# Patient Record
Sex: Male | Born: 1975 | Marital: Single | Smoking: Never smoker
Health system: Northeastern US, Academic
[De-identification: ages and names within clinical notes are randomized; demographics above are authoritative.]

## PROBLEM LIST (undated history)

## (undated) DIAGNOSIS — K91850 Pouchitis: Secondary | ICD-10-CM

## (undated) DIAGNOSIS — R002 Palpitations: Secondary | ICD-10-CM

## (undated) DIAGNOSIS — M810 Age-related osteoporosis without current pathological fracture: Secondary | ICD-10-CM

## (undated) DIAGNOSIS — K5190 Ulcerative colitis, unspecified, without complications: Secondary | ICD-10-CM

## (undated) DIAGNOSIS — K8301 Primary sclerosing cholangitis: Secondary | ICD-10-CM

## (undated) DIAGNOSIS — D649 Anemia, unspecified: Secondary | ICD-10-CM

## (undated) DIAGNOSIS — K769 Liver disease, unspecified: Secondary | ICD-10-CM

## (undated) DIAGNOSIS — K8309 Other cholangitis: Secondary | ICD-10-CM

## (undated) HISTORY — PX: APPENDECTOMY: SHX54

## (undated) HISTORY — DX: Ulcerative colitis, unspecified, without complications: K51.90

## (undated) HISTORY — PX: COLONOSCOPY: SHX174

## (undated) HISTORY — DX: Liver disease, unspecified: K76.9

## (undated) HISTORY — PX: TOTAL COLECTOMY: SHX852

## (undated) HISTORY — DX: Pouchitis: K91.850

## (undated) HISTORY — DX: Primary sclerosing cholangitis: K83.01

## (undated) HISTORY — PX: LAPAROSCOPIC TOTAL ABDOMINAL COLECTOMY: SHX5932

## (undated) HISTORY — PX: COLON SURGERY: SHX602

---

## 2009-05-19 ENCOUNTER — Inpatient Hospital Stay: Admit: 2009-05-19 | Discharge: 2009-05-19 | Disposition: A | Discharge status: Home / Self Care | Payer: Self-pay

## 2009-05-19 ENCOUNTER — Other Ambulatory Visit: Payer: Self-pay | Admitting: Gastroenterology

## 2016-05-12 ENCOUNTER — Other Ambulatory Visit: Payer: Self-pay | Admitting: Gastroenterology

## 2016-05-12 DIAGNOSIS — R1084 Generalized abdominal pain: Secondary | ICD-10-CM

## 2016-05-12 DIAGNOSIS — K8301 Primary sclerosing cholangitis: Secondary | ICD-10-CM

## 2016-05-12 DIAGNOSIS — R197 Diarrhea, unspecified: Secondary | ICD-10-CM

## 2016-05-12 DIAGNOSIS — Z8719 Personal history of other diseases of the digestive system: Secondary | ICD-10-CM

## 2016-05-12 DIAGNOSIS — K625 Hemorrhage of anus and rectum: Secondary | ICD-10-CM

## 2016-05-15 ENCOUNTER — Inpatient Hospital Stay: Admit: 2016-05-15 | Discharge: 2016-05-15 | Disposition: A | Discharge status: Home / Self Care | Payer: Self-pay

## 2016-05-15 ENCOUNTER — Other Ambulatory Visit: Payer: Self-pay | Admitting: Gastroenterology

## 2016-05-15 ENCOUNTER — Ambulatory Visit
Admission: RE | Admit: 2016-05-15 | Discharge: 2016-05-15 | Disposition: A | Discharge status: Home / Self Care | Payer: BLUE CROSS/BLUE SHIELD | Source: Ambulatory Visit | Attending: Gastroenterology | Admitting: Gastroenterology

## 2016-05-15 DIAGNOSIS — R1084 Generalized abdominal pain: Secondary | ICD-10-CM | POA: Insufficient documentation

## 2016-05-15 DIAGNOSIS — R197 Diarrhea, unspecified: Secondary | ICD-10-CM | POA: Insufficient documentation

## 2016-05-15 DIAGNOSIS — K625 Hemorrhage of anus and rectum: Secondary | ICD-10-CM | POA: Diagnosis not present

## 2016-05-15 DIAGNOSIS — Z8719 Personal history of other diseases of the digestive system: Secondary | ICD-10-CM | POA: Insufficient documentation

## 2016-05-15 DIAGNOSIS — K830 Cholangitis: Secondary | ICD-10-CM | POA: Insufficient documentation

## 2016-05-15 DIAGNOSIS — K8301 Primary sclerosing cholangitis: Secondary | ICD-10-CM

## 2016-05-15 MED ORDER — IOPAMIDOL (ISOVUE-370) INJECTION 76%
125.0000 mL | Freq: Once | INTRAVENOUS | Status: AC | PRN
  Administered 2016-05-15: 125 mL via INTRAVENOUS

## 2016-05-18 ENCOUNTER — Other Ambulatory Visit: Payer: Self-pay | Admitting: Gastroenterology

## 2016-05-18 DIAGNOSIS — K7689 Other specified diseases of liver: Secondary | ICD-10-CM

## 2016-05-18 DIAGNOSIS — R935 Abnormal findings on diagnostic imaging of other abdominal regions, including retroperitoneum: Secondary | ICD-10-CM

## 2016-05-20 ENCOUNTER — Encounter: Payer: Self-pay | Admitting: Anesthesiology

## 2016-05-20 ENCOUNTER — Encounter
Admission: RE | Disposition: A | Discharge status: Home Health | Payer: Self-pay | Source: Ambulatory Visit | Attending: Gastroenterology

## 2016-05-20 ENCOUNTER — Encounter: Payer: Self-pay | Admitting: *Deleted

## 2016-05-20 ENCOUNTER — Ambulatory Visit
Admission: RE | Admit: 2016-05-20 | Discharge: 2016-05-20 | Disposition: A | Discharge status: Home Health | Payer: BLUE CROSS/BLUE SHIELD | Source: Ambulatory Visit | Attending: Gastroenterology | Admitting: Gastroenterology

## 2016-05-20 DIAGNOSIS — Z980 Intestinal bypass and anastomosis status: Secondary | ICD-10-CM | POA: Insufficient documentation

## 2016-05-20 DIAGNOSIS — R1084 Generalized abdominal pain: Secondary | ICD-10-CM | POA: Diagnosis present

## 2016-05-20 DIAGNOSIS — K91850 Pouchitis: Secondary | ICD-10-CM | POA: Insufficient documentation

## 2016-05-20 DIAGNOSIS — K529 Noninfective gastroenteritis and colitis, unspecified: Secondary | ICD-10-CM | POA: Diagnosis not present

## 2016-05-20 DIAGNOSIS — Z882 Allergy status to sulfonamides status: Secondary | ICD-10-CM | POA: Insufficient documentation

## 2016-05-20 HISTORY — DX: Anemia, unspecified: D64.9

## 2016-05-20 HISTORY — DX: Other cholangitis: K83.09

## 2016-05-20 HISTORY — PX: FLEXIBLE SIGMOIDOSCOPY: SHX5431

## 2016-05-20 HISTORY — DX: Ulcerative colitis, unspecified, without complications: K51.90

## 2016-05-20 SURGERY — SIGMOIDOSCOPY, FLEXIBLE
Anesthesia: General

## 2016-05-20 MED ORDER — SODIUM CHLORIDE 0.9 % IV SOLN: INTRAVENOUS | Status: DC

## 2016-05-20 NOTE — OR Nursing (Signed)
Patient had no sedation or IV placed therefore, was discharged after putting clothes on and talking with Dr. Marva PandaSkulskie

## 2016-05-20 NOTE — H&P (Signed)
Outpatient short stay form Pre-procedure 05/20/2016 3:04 PM Cristian Lee Sung Parodi MD  Primary Physician: No primary physician  Reason for visit:  Flexible sigmoidoscopy  History of present illness:  Patient is a 40 year old male presenting for flexible sigmoidoscopy as above. 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 The last endoscopic evaluation was in 2011 right after the procedure. He has had only a few follow-up evaluations at any point since that time. He presented to the GI clinic with complaint of abdominal pain and loose stools and some rectal bleeding. He was placed on a course of which is not yet completed that is feeling much better. Frequency of stools is decreased. Patient denies use of any aspirin products or blood thinning agents.    Current facility-administered medications:  .  0.9 %  sodium chloride infusion, , Intravenous, Continuous, Cristian Lee Alonnie Bieker, MD  Prescriptions prior to admission  Medication Sig Dispense Refill Last Dose  . metroNIDAZOLE (FLAGYL) 500 MG tablet Take 500 mg by mouth 3 (three) times daily.        Allergies  Allergen Reactions  . Sulfa Antibiotics      Past Medical History  Diagnosis Date  . Cholangitis   . Chronic ulcerative colitis (HCC)   . Anemia     Review of systems:      Physical Exam    Heart and lungs: Regular rate and rhythm without rub or gallop, lungs are bilaterally clear.    HEENT: Normocephalic atraumatic eyes are anicteric    Other:    Pertinant exam for procedure: Soft, nontender nondistended bowel sounds are positive normoactive   Planned proceedures: Possible sigmoidoscopy and indicated procedures. I have discussed the risks benefits and complications of procedures to include not limited to bleeding, infection, perforation and the risk of sedation and the patient wishes to proceed.      Cristian Lee Jeanluc Wegman, MD Gastroenterology 05/20/2016   3:04 PM

## 2016-05-20 NOTE — Op Note (Addendum)
[facility] Gastroenterology Patient Name: Cristian Lee Procedure Date: 05/20/2016 2:38 PM MRN: [id number] Account #: [id number] Date of Birth: 01/06/1976 Admit Type: Outpatient Age: [age] Room: [facility] ENDO ROOM 1 Gender: Male Note Status: Finalized Procedure:            Flexible Sigmoidoscopy Indications:          Generalized abdominal pain, Rectal bleeding, history of                        Ulcerative colitis, total colectomy and J pouch                        reconstruction. Providers:            Christena DeemMartin U. Delsa Walder, MD Medicines:            None Complications:        No immediate complications. Procedure:            Pre-Anesthesia Assessment:                       - ASA Grade Assessment: II - A patient with mild                        systemic disease.                       After obtaining informed consent, the scope was passed                        under direct vision. The Colonoscope was introduced                        through the anus and advanced to the 15 cm from the                        anal verge. The flexible sigmoidoscopy was accomplished                        without difficulty. The patient tolerated the procedure                        well. The quality of the bowel preparation was fair. Findings:      The digital rectal exam was normal. The scope was advanced to about 15       cm from the anal verge. There was the appearance of small bowel tissue       throughrt with minimal discernable "ring" of residual colon. There were       some small ulcerations in the pouch mucosa, no other lesions. Biopsies       taken from the pouch in several locations, with friability noted and a       biopsy from the apparent anastomosis. No mass lesions were noted.      A diffuse area of mildly friable (with contact bleeding) mucosa and a       small ulceration was found in the rectal pouch. Biopsies were taken with       a cold forceps for histology. Impression:            - Preparation of the colon was fair. Recommendation:       - Await pathology  results.                       - continue Flagyl (metronidazole) 500 mg PO TID for 1                        week. Christena Deem, MD 05/20/2016 3:47:27 PM This report has been signed electronically. Number of Addenda: 0 Note Initiated On: 05/20/2016 2:38 PM      [facility]

## 2016-05-21 ENCOUNTER — Encounter: Payer: Self-pay | Admitting: Gastroenterology

## 2016-05-27 LAB — SURGICAL PATHOLOGY

## 2016-06-09 ENCOUNTER — Other Ambulatory Visit: Payer: Self-pay | Admitting: Gastroenterology

## 2016-06-09 ENCOUNTER — Inpatient Hospital Stay: Admit: 2016-06-09 | Discharge: 2016-06-09 | Disposition: A | Discharge status: Home / Self Care | Payer: Self-pay

## 2016-06-09 ENCOUNTER — Ambulatory Visit
Admission: RE | Admit: 2016-06-09 | Discharge: 2016-06-09 | Disposition: A | Discharge status: Home / Self Care | Payer: BLUE CROSS/BLUE SHIELD | Source: Ambulatory Visit | Attending: Gastroenterology | Admitting: Gastroenterology

## 2016-06-09 DIAGNOSIS — K830 Cholangitis: Secondary | ICD-10-CM | POA: Insufficient documentation

## 2016-06-09 DIAGNOSIS — R935 Abnormal findings on diagnostic imaging of other abdominal regions, including retroperitoneum: Secondary | ICD-10-CM | POA: Diagnosis present

## 2016-06-09 DIAGNOSIS — R590 Localized enlarged lymph nodes: Secondary | ICD-10-CM | POA: Insufficient documentation

## 2016-06-09 DIAGNOSIS — K7689 Other specified diseases of liver: Secondary | ICD-10-CM

## 2016-06-09 MED ORDER — GADOXETATE DISODIUM 0.25 MMOL/ML IV SOLN
5.0000 mL | Freq: Once | INTRAVENOUS | Status: AC | PRN
  Administered 2016-06-09: 5 mL via INTRAVENOUS

## 2016-06-09 MED ORDER — GADOXETATE DISODIUM 0.25 MMOL/ML IV SOLN
5.0000 mL | Freq: Once | INTRAVENOUS | Status: AC | PRN
  Administered 2016-06-09: 1 mL via INTRAVENOUS

## 2016-09-22 IMAGING — MR MR ABDOMEN WO/W CM
6 of 16 series · 19 of 48 positions shown · IV contrast (eovist)
Comparison: 05/15/2016 CT abdomen/ pelvis.

CLINICAL DATA: 39-year-old male with a history of total colectomy
for ulcerative colitis and primary sclerosing cholangitis presents
for further evaluation of complex liver lesion on recent CT.

EXAM:
MRI ABDOMEN WITHOUT AND WITH CONTRAST
TECHNIQUE: Multiplanar multisequence MR imaging of the abdomen was performed
both before and after the administration of intravenous contrast.
CONTRAST:  5 cc Eovist IV.

[Series 2: T2 · coronal · 8.5mm · 1.37mm/px · 2 of 20 slices shown]
[im 1/20]
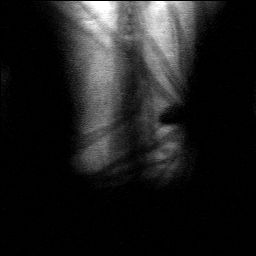
[im 20/20]
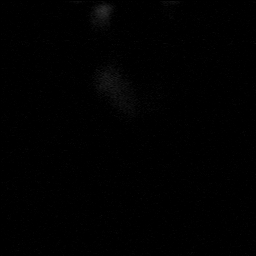

[Series 3: axial in-out of · axial · 8.0mm · 0.63mm/px · z∈[-164,+86]mm · 4 of 50 slices shown]
[im 1/50]
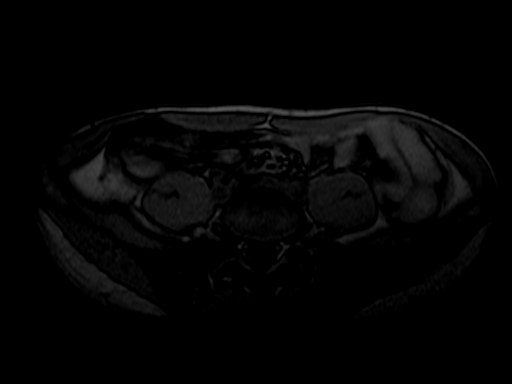
[im 17/50]
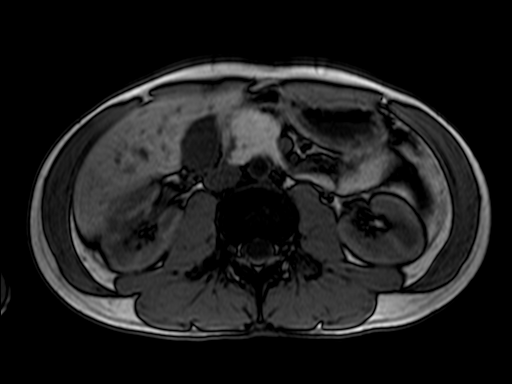
[im 33/50]
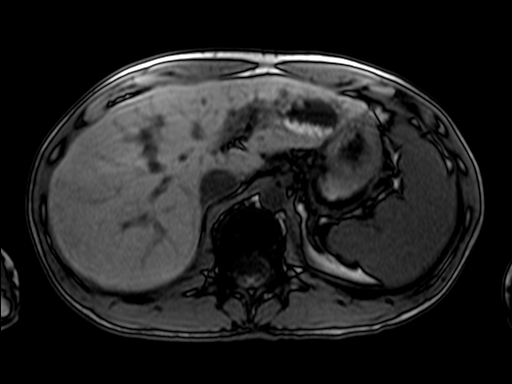
[im 50/50]
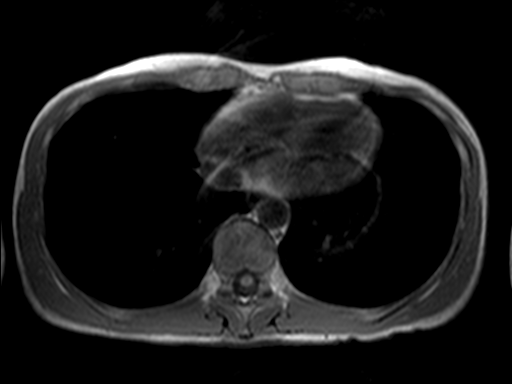

[Series 4: T2 fat-sat · axial · 8.0mm · 0.63mm/px · z∈[-164,+86]mm · 2 of 25 slices shown]
[im 1/25]
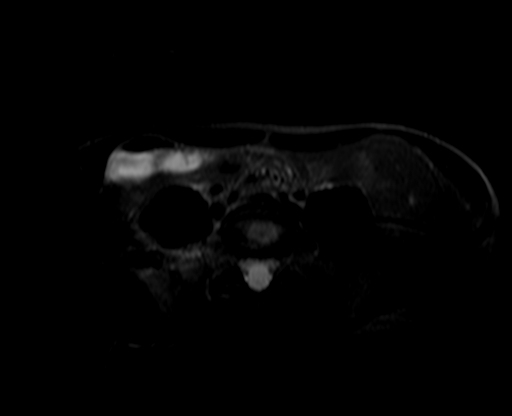
[im 25/25]
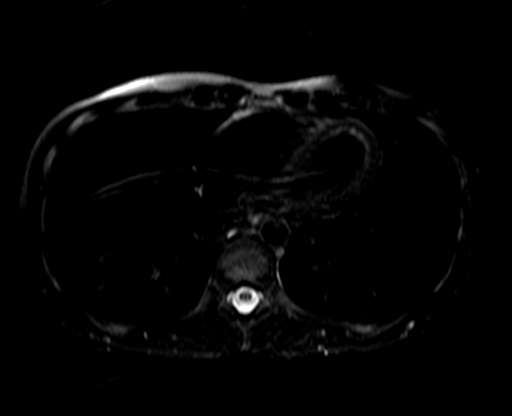

[Series 5: DWI · axial · 6.5mm · 2.54mm/px · z∈[-161,+88]mm · 6 of 99 slices shown]
[im 1/99]
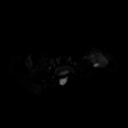
[im 20/99]
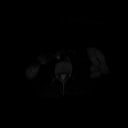
[im 40/99]
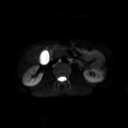
[im 59/99]
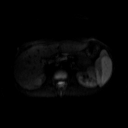
[im 79/99]
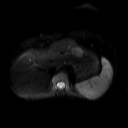
[im 99/99]
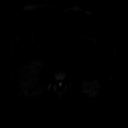

[Series 6: axial dwi_adc · axial · 6.5mm · 2.54mm/px · z∈[-161,+88]mm · 2 of 33 slices shown]
[im 1/33]
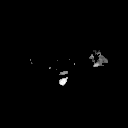
[im 33/33]
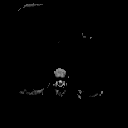

[Series 7: axial true fisp-- · axial · 5.0mm · 0.63mm/px · z∈[-162,+88]mm · 3 of 51 slices shown]
[im 1/51]
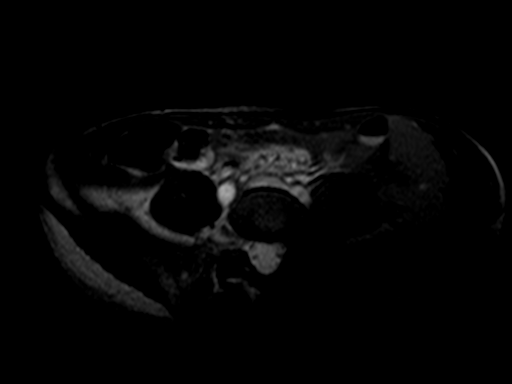
[im 26/51]
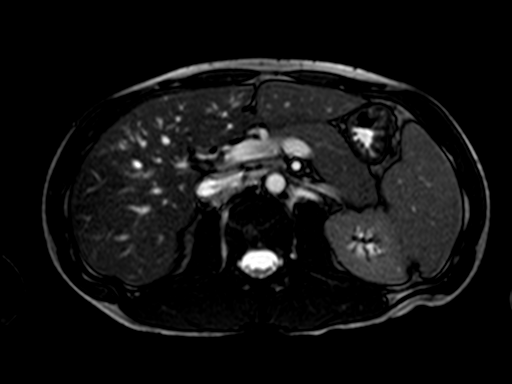
[im 51/51]
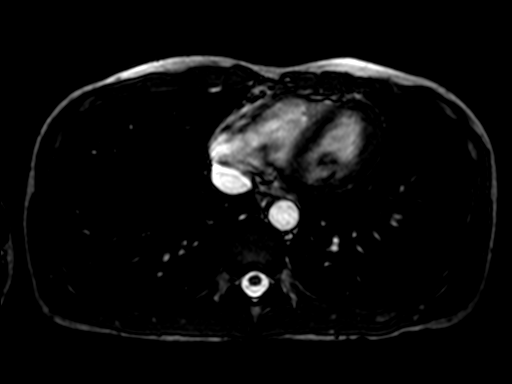

[19 of 48 positions shown; findings below may reference images not displayed]

FINDINGS: Lower chest: Clear lung bases.

Hepatobiliary: Normal liver size and configuration. No definite
liver surface irregularity. Scattered mild areas of lace-like
periportal T2 hyperintensity, most prominent in segment 8 of the
right liver lobe, which could represent areas of mild hepatic
fibrosis. No hepatic steatosis. There is a 4.1 x 2.9 x 3.4 cm
complex cystic mass in the lateral segment left liver lobe (series
7/image 17), not appreciably changed in size since 05/15/2016, and
which demonstrates numerous tiny internal 2-3 mm low T2 high T1
signal intensity round foci both adherent to the nondependent wall
and layering within the cyst, with no appreciable internal or wall
enhancement, most consistent with a biloma with internal sludge
balls/stones. No additional liver lesions. Normal gallbladder with
no cholelithiasis. Minimal inhomogeneous areas of intrahepatic
biliary ductal dilatation throughout the liver, not appreciably
changed since 05/15/2016. Normal caliber common bile duct measuring
3 mm in diameter. No evidence of choledocholithiasis. No biliary or
ampullary mass.

Pancreas: No pancreatic mass or duct dilation.  No pancreas divisum.

Spleen: Normal size. No mass.

Adrenals/Urinary Tract: Normal adrenals. No hydronephrosis. Normal
kidneys with no renal mass.

Stomach/Bowel: Grossly normal stomach. Visualized small and large
bowel is normal caliber, with no bowel wall thickening.

Vascular/Lymphatic: Normal caliber abdominal aorta. Patent portal,
splenic, hepatic and renal veins. Mildly enlarged 1.0 cm porta
hepatis node (series 7/ image 23), unchanged.

Other: No abdominal ascites or focal fluid collection.

Musculoskeletal: No aggressive appearing focal osseous lesions.
IMPRESSION: 1. Minimal inhomogeneous intrahepatic biliary ductal dilatation,
consistent with the history of primary sclerosing cholangitis.
Normal caliber common bile duct.
2. No macroscopic evidence of cirrhosis. Patchy mild areas of
lace-like periportal T2 hyperintensity could represent areas of mild
hepatic fibrosis. Consider hepatic elastography for further liver
fibrosis risk stratification, as clinically warranted.
3. Nonenhancing complex 4.1 cm cystic liver lesion in the lateral
segment left liver lobe with tiny internal round foci, most
suggestive of a biloma with internal sludge balls/stones, stable
since 05/15/2016. No additional liver lesions.
4. Mild porta hepatis lymphadenopathy, stable, nonspecific, probably
reactive.

## 2017-08-06 ENCOUNTER — Other Ambulatory Visit: Payer: Self-pay | Admitting: Gastroenterology

## 2017-08-19 ENCOUNTER — Other Ambulatory Visit: Payer: Self-pay | Admitting: Gastroenterology

## 2017-08-19 ENCOUNTER — Inpatient Hospital Stay: Admit: 2017-08-19 | Discharge: 2017-08-19 | Disposition: A | Discharge status: Home / Self Care | Payer: Self-pay

## 2017-10-07 ENCOUNTER — Other Ambulatory Visit: Payer: Self-pay | Admitting: Gastroenterology

## 2017-10-07 ENCOUNTER — Inpatient Hospital Stay: Admit: 2017-10-07 | Discharge: 2017-10-07 | Disposition: A | Discharge status: Home / Self Care | Payer: Self-pay

## 2017-12-17 ENCOUNTER — Inpatient Hospital Stay: Admit: 2017-12-17 | Discharge: 2017-12-17 | Disposition: A | Discharge status: Home / Self Care | Payer: Self-pay

## 2017-12-17 ENCOUNTER — Encounter: Payer: Self-pay | Admitting: Gastroenterology

## 2018-07-25 ENCOUNTER — Encounter: Payer: Self-pay | Admitting: Gastroenterology

## 2018-07-25 ENCOUNTER — Inpatient Hospital Stay: Admit: 2018-07-25 | Discharge: 2018-07-25 | Disposition: A | Discharge status: Home / Self Care | Payer: Self-pay

## 2018-08-08 ENCOUNTER — Telehealth: Payer: Self-pay

## 2018-08-08 NOTE — Telephone Encounter (Signed)
Kelly from [place] is calling to check the status of a referral sent on 08/05/18. She is requesting a call back at [phone] ext [phone].

## 2018-08-09 ENCOUNTER — Telehealth: Payer: Self-pay

## 2018-08-09 NOTE — Telephone Encounter (Signed)
Referral has not yet been received.  Once received, patient will be contacted for an appointment

## 2018-08-09 NOTE — Telephone Encounter (Signed)
Took call from [place] from [facility] to follow-up on referral faxed on Friday. Per [name] this is a liver transplant referral. Verified she faxed to [phone]. Call back # [phone]. Dr. Fay Records referring.

## 2018-08-09 NOTE — Telephone Encounter (Signed)
Called [name]. I have not received records yet. They will fax them again attn: Antonique Langford.

## 2018-08-10 NOTE — Telephone Encounter (Signed)
Error

## 2018-08-15 ENCOUNTER — Other Ambulatory Visit: Payer: Self-pay

## 2018-08-15 NOTE — Telephone Encounter (Signed)
GI calling to check referral, spoke with carolyn stated no referral with that name, informed GI office, then stated " Amy stated she has the referral and it was being worked on . infomred that is not documented in the chart . Called carolyn again and she stated she would speak to office, connected call

## 2018-08-15 NOTE — Telephone Encounter (Signed)
Spoke to Kyle Bright and advised we received all the records and it will be reviewed and we will get back to patient ASAP. [name] has all the records.

## 2018-08-16 ENCOUNTER — Telehealth: Payer: Self-pay | Admitting: Transplant

## 2018-08-16 NOTE — Telephone Encounter (Signed)
Left message with Dr. Reddy's office that referral was received and that we were waiting to review the imaging prior to scheduling left my name and number for contact

## 2018-08-16 NOTE — Telephone Encounter (Signed)
Records received previously and reviewed with Dr. Vickii Penna.  Per Dr. Vickii Penna, he would like additional records which have already been received.  Will have assistant call referring to notify them that we are working on scheduling patient however Dr. Vickii Penna would like case reviewed prior to scheduling appointment.

## 2018-08-17 ENCOUNTER — Other Ambulatory Visit
Admission: RE | Admit: 2018-08-17 | Discharge: 2018-08-17 | Disposition: A | Discharge status: Home / Self Care | Payer: PRIVATE HEALTH INSURANCE | Source: Ambulatory Visit | Attending: Pathology | Admitting: Pathology

## 2018-08-17 ENCOUNTER — Other Ambulatory Visit: Payer: Self-pay

## 2018-08-17 DIAGNOSIS — Z0189 Encounter for other specified special examinations: Secondary | ICD-10-CM | POA: Insufficient documentation

## 2018-08-18 LAB — SURGICAL PATHOLOGY

## 2018-08-22 ENCOUNTER — Telehealth: Payer: Self-pay

## 2018-08-22 NOTE — Telephone Encounter (Signed)
Called and spoke to patient and confirmed evaluation with Dr. Vickii Penna for 10/1 @ 2:30 [facility]    Mailed letter to patient

## 2018-08-25 ENCOUNTER — Telehealth: Payer: Self-pay

## 2018-08-25 NOTE — Telephone Encounter (Signed)
Kelly from [place] called to confirm that pt had eval appointment set up... Adv of 08/30/18 appt. For eval and that pt has been made aware.

## 2018-08-29 ENCOUNTER — Other Ambulatory Visit: Payer: Self-pay | Admitting: Transplant

## 2018-08-29 DIAGNOSIS — K8301 Primary sclerosing cholangitis: Secondary | ICD-10-CM

## 2018-08-30 ENCOUNTER — Other Ambulatory Visit
Admission: RE | Admit: 2018-08-30 | Discharge: 2018-08-30 | Disposition: A | Discharge status: Home / Self Care | Payer: PRIVATE HEALTH INSURANCE | Source: Ambulatory Visit | Attending: Transplant | Admitting: Transplant

## 2018-08-30 ENCOUNTER — Ambulatory Visit
Payer: PRIVATE HEALTH INSURANCE | Attending: Transplant Surgery Liver and Kidney | Admitting: Transplant Surgery Liver and Kidney

## 2018-08-30 VITALS — BP 126/78 | HR 77 | Temp 97.5°F | Resp 18 | Ht 69.0 in | Wt 126.9 lb

## 2018-08-30 DIAGNOSIS — K8301 Primary sclerosing cholangitis: Secondary | ICD-10-CM

## 2018-08-30 DIAGNOSIS — K831 Obstruction of bile duct: Secondary | ICD-10-CM

## 2018-08-30 LAB — COMPREHENSIVE METABOLIC PANEL
ALT: 153 U/L — ABNORMAL HIGH (ref 0–50)
AST: 104 U/L — ABNORMAL HIGH (ref 0–50)
Albumin: 4.6 g/dL (ref 3.5–5.2)
Alk Phos: 388 U/L — ABNORMAL HIGH (ref 40–130)
Anion Gap: 13 (ref 7–16)
Bilirubin,Total: 0.7 mg/dL (ref 0.0–1.2)
CO2: 28 mmol/L (ref 20–28)
Calcium: 9.4 mg/dL (ref 9.0–10.3)
Chloride: 100 mmol/L (ref 96–108)
Creatinine: 0.89 mg/dL (ref 0.67–1.17)
GFR,Black: 122 *
GFR,Caucasian: 106 *
Glucose: 90 mg/dL (ref 60–99)
Lab: 13 mg/dL (ref 6–20)
Potassium: 4.3 mmol/L (ref 3.3–5.1)
Sodium: 141 mmol/L (ref 133–145)
Total Protein: 7.8 g/dL — ABNORMAL HIGH (ref 6.3–7.7)

## 2018-08-30 LAB — CBC AND DIFFERENTIAL
Baso # K/uL: 0 10*3/uL (ref 0.0–0.1)
Basophil %: 0.8 %
Eos # K/uL: 0 10*3/uL (ref 0.0–0.5)
Eosinophil %: 0.8 %
Hematocrit: 49 % (ref 40–51)
Hemoglobin: 16.3 g/dL (ref 13.7–17.5)
IMM Granulocytes #: 0 10*3/uL
IMM Granulocytes: 0.2 %
Lymph # K/uL: 1.4 10*3/uL (ref 1.3–3.6)
Lymphocyte %: 27.3 %
MCH: 29 pg/cell (ref 26–32)
MCHC: 33 g/dL (ref 32–37)
MCV: 89 fL (ref 79–92)
Mono # K/uL: 0.5 10*3/uL (ref 0.3–0.8)
Monocyte %: 9.6 %
Neut # K/uL: 3.1 10*3/uL (ref 1.8–5.4)
Nucl RBC # K/uL: 0 10*3/uL (ref 0.0–0.0)
Nucl RBC %: 0 /100 WBC (ref 0.0–0.2)
Platelets: 236 10*3/uL (ref 150–330)
RBC: 5.6 MIL/uL (ref 4.6–6.1)
RDW: 12 % (ref 11.6–14.4)
Seg Neut %: 61.3 %
WBC: 5 10*3/uL (ref 4.2–9.1)

## 2018-08-30 LAB — AFP TUMOR MARKER: AFP (eff. 4-2010): 3 IU/mL (ref 0–7)

## 2018-08-30 LAB — PROTIME-INR
INR: 0.9 (ref 0.9–1.1)
Protime: 10.8 s (ref 10.0–12.9)

## 2018-08-30 LAB — CEA: CEA: 2.6 ng/mL (ref 0.0–4.7)

## 2018-08-30 LAB — CA 19 9 (EFF. 01-2011): CA 19 9 (eff. 01-2011): 42 U/mL — ABNORMAL HIGH (ref 0–35)

## 2018-08-30 NOTE — H&P (Signed)
HPI:  Today we had the pleasure of seeing Margaret Staggs who is a 42 y.o. male in the [facility] for evaluation of [place] with concern for possible cholangiocarcinoma given recent ERCP brushings.  As you know he has additional PMH significant for Ulcerative colitis s/p TAC and subsequent ileostomy reversal and j-pouch creation in 2010. He also has newly diagnosed osteoporosis and is planning to see endocrinology for a comprehensive workup.     He has been referred to [place] by his Gastroenterologist Dr. Fay Records [facility]).    The patient complains of fatigue and myalgias as well as occasional diarrhea. Symptoms began worsening around the end of 2018 and the patient was found to have elevated transaminases. He underwent ERCP with dilation of strictured areas and brushings and had subsequent improvement in his transaminitis. He began to feel worse in August and was again found to have elevated LFTs. He underwent repeat ERCP in August of 2019 which was concerning for reactive changes. There was concern for possible cholangiocarcinoma by the gastroenterology group and therefore the patient was referred for the potential for additional workup.     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 He wishes to avoid extensive procedures at this time due to his program. He and his significant other were inquiring about the potential need for future liver transplantation.       General Health/ Anesthesia Risks  Recent Hospitalization No    Recent weight gain/loss? No   Family with Sudden Cardiac Death? Personal Hx of MI? No   History of DVT/PE/Family history of same or hypercoagulable disorder No   Recent Illness/Sick Contacts:  No   Previous anaesthesia issues (immediate family included) No   Currently on Pain Meds? (who is following) No     Functional Status  Current Job Status  PhD Student   Ability to perform  ADLs Yes   Current Activity Level/Exercise Low/Moderately Active   At least 4 METS (walk up one flight of stairs or perform heavy housework like scrubbing floors)  If <4, needs cards clearance    Yes   Lives with           Past Medical/Surgical History:  No past medical history on file.  No past surgical history on file.    Allergies:  Allergies   Allergen Reactions    Sulfa Antibiotics Rash     Rash on legs        Current Medications:  No outpatient prescriptions prior to visit.     No facility-administered medications prior to visit.      This patient's medications have not been reviewed.    Family History:  No family history on file.    Social/Occupational History:  Social History     Social History Narrative    No narrative on file       Review of Systems:  Review of Systems   Constitutional: Positive for fatigue. Negative for activity change, appetite change, chills, diaphoresis and fever.   HENT: Negative for congestion and dental problem.    Eyes: Negative for pain, discharge and itching.   Respiratory: Negative for apnea, cough, chest tightness, shortness of breath and wheezing.    Cardiovascular: Positive for palpitations. Negative for chest pain and leg swelling.   Gastrointestinal: Positive for diarrhea. Negative for abdominal distention, abdominal pain, anal bleeding, blood in stool, constipation, nausea and vomiting.  Endocrine: Negative for cold intolerance and heat intolerance.   Genitourinary: Negative for decreased urine volume, difficulty urinating, dysuria, hematuria and urgency.   Musculoskeletal: Negative for arthralgias and back pain.   Skin: Negative for color change, pallor and rash.   Neurological: Negative for dizziness, facial asymmetry, weakness, light-headedness, numbness and headaches.       Vital Signs:  BP 126/78    Pulse 77    Temp 36.4 C (97.5 F)    Resp 18    Ht 1.753 m (5\' 9" )    Wt 57.6 kg (126 lb 14.4 oz)    SpO2 97%    BMI 18.74 kg/m   Body mass index is 18.74  kg/m.    Physical Exam:  Physical Exam   Constitutional: He is oriented to person, place, and time. He appears well-developed and well-nourished. No distress.   HENT:   Head: Normocephalic and atraumatic.   Eyes: Pupils are equal, round, and reactive to light. Conjunctivae are normal. Right eye exhibits no discharge. Left eye exhibits no discharge. No scleral icterus.   Neck: Normal range of motion. Neck supple. No JVD present. No thyromegaly present.   Cardiovascular: Normal rate and regular rhythm.    Pulmonary/Chest: Effort normal and breath sounds normal. No respiratory distress.   Abdominal: Soft. Bowel sounds are normal. He exhibits no distension. There is no tenderness. There is no rebound.   Musculoskeletal: Normal range of motion. He exhibits no edema, tenderness or deformity.   Lymphadenopathy:     He has no cervical adenopathy.   Neurological: He is alert and oriented to person, place, and time. No cranial nerve deficit. Coordination normal.   Skin: Skin is warm and dry. No rash noted. He is not diaphoretic. No erythema.   Psychiatric: He has a normal mood and affect. His behavior is normal. Judgment and thought content normal.       Lab Results: Labs drawn today in clinic.   Radiology impressions:   No results found.    Problems:  There is no problem list on file for this patient.      Assessment:   Seabron Iannello is a 41 y.o. male with a history of UC s/p TAC and subsequent ileostomy takedown and j-pouch creation and PSC s/p ERCP with brushings in January 2019 and August 2019 due to transaminitis and worsening fatigue/malaise (AST 150, ALT 300). Brushings were concerning for reactive process/inflammation which is likely consistent with the patient's diagnosis of [place]. He does not have any current direct imaging evidence or stigmata of cirrhosis or liver failure at this time and further workup is likely needed.     Plan:   - CBC, CMP and PT/INR to be performed  - MRCP to be performed. If any abnormalities  are seen we would favor repeat ERCP to be performed at [facility] in order to have a complete/comprehensive workup of this patients biliary system.     Thank you for referring this patient to the [facility] Transplant and Hepatobiliary Service.     Gabriel Cirri, MD 08/30/2018 3:20 PM  PGY-2 General Surgery Resident

## 2018-09-05 ENCOUNTER — Telehealth: Payer: Self-pay

## 2018-09-05 ENCOUNTER — Other Ambulatory Visit: Payer: Self-pay | Admitting: Transplant

## 2018-09-05 DIAGNOSIS — K5190 Ulcerative colitis, unspecified, without complications: Secondary | ICD-10-CM

## 2018-09-05 NOTE — Telephone Encounter (Signed)
Called and confirmed upcoming MRI/MRCP with patient.  10/22 at 630pm in [facility]

## 2018-09-20 ENCOUNTER — Ambulatory Visit
Admission: RE | Admit: 2018-09-20 | Discharge: 2018-09-20 | Disposition: A | Discharge status: Home / Self Care | Payer: PRIVATE HEALTH INSURANCE | Source: Ambulatory Visit | Attending: Transplant | Admitting: Transplant

## 2018-09-20 DIAGNOSIS — K831 Obstruction of bile duct: Secondary | ICD-10-CM

## 2018-09-20 DIAGNOSIS — K5190 Ulcerative colitis, unspecified, without complications: Secondary | ICD-10-CM

## 2018-09-20 DIAGNOSIS — K7689 Other specified diseases of liver: Secondary | ICD-10-CM | POA: Insufficient documentation

## 2018-09-20 DIAGNOSIS — K8301 Primary sclerosing cholangitis: Secondary | ICD-10-CM | POA: Insufficient documentation

## 2018-09-20 MED ORDER — GADOBUTROL 1 MMOL/ML (GADAVIST) IV SOLN *WRAPPED*
7.0000 mL | Freq: Once | INTRAVENOUS | Status: AC
Start: 2018-09-20 — End: 2018-09-20
  Administered 2018-09-20: 7 mL via INTRAVENOUS

## 2018-09-30 ENCOUNTER — Other Ambulatory Visit: Payer: Self-pay | Admitting: Transplant

## 2018-09-30 ENCOUNTER — Encounter: Payer: Self-pay | Admitting: Transplant

## 2018-09-30 DIAGNOSIS — K8301 Primary sclerosing cholangitis: Secondary | ICD-10-CM

## 2018-09-30 NOTE — Progress Notes (Signed)
Called pt to discuss results of MRI/MRCP.  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  Set him up for ERCP with spyglass. . Encouraged to call back with questions

## 2018-10-01 ENCOUNTER — Other Ambulatory Visit: Payer: PRIVATE HEALTH INSURANCE

## 2018-10-05 NOTE — Telephone Encounter (Signed)
Called patient left message on voicemail and mailed letter to contact GI

## 2018-10-05 NOTE — Telephone Encounter (Signed)
Kyle Patch, MD  [name]            Office appointment on 11/18.     Asad

## 2018-10-07 ENCOUNTER — Encounter: Payer: Self-pay | Admitting: Gastroenterology

## 2018-10-10 ENCOUNTER — Encounter: Payer: Self-pay | Admitting: Transplant

## 2018-10-10 NOTE — Progress Notes (Signed)
Received communication form Dr Fay Records regarding Kyle Bright last visit.  In her letter she stated pt has appt with DR Foye Spurling soon.  Looked in e records and GI has called and sent a letter.  I called pt to remind him to call DR Foye Spurling office.  There was no answer so left voice message.

## 2018-11-10 ENCOUNTER — Telehealth: Payer: Self-pay | Admitting: Gastroenterology

## 2018-11-10 NOTE — Telephone Encounter (Signed)
Copied from [place] [phone]. Topic: Appointments - Schedule Appointment  >> Nov 10, 2018 10:36 AM Noralee Space B wrote:  .  Patient calling to schedule NPV with GI. Writer reached out to back office and confirmed patient for 1/2 at 115am with Dr. Foye Spurling GIS. Patient can be reached at [phone]

## 2018-11-30 NOTE — H&P (Signed)
Gastroenterology and Hepatology Office Visit:    Kyle Bright  [id number]     12/01/2018  No referring provider defined for this encounter.    Chief Complaint/Reason for Office Visit: [facility]    We had the pleasure of seeing your patient, Kyle Bright, in the outpatient gastroenterology/hepatology clinic. As you know, he is a 43 y.o. 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    Patient has a hx of [place] which was originally diagnosed in January 2005.  States that the blood tests have been up and down and he thinks that stress levels have been contributing.  States that the [facility] has been poorly controlled causing them to get treatment ERCPs.  States that right now he is dealing with a lot of fatigue and some abdominal pain in the b/l lower quadrants.    Patient states that he was progressively fatigued with myalgias and diarrhea which caused him to be evaluated by GI in [place]. They performed ERCPs which have shown multiple strictures of the left and right hepatic ducts, upper third of the main bile duct.  Brushings in January, 2019 showed atypical cells most likely representing reactive changes.  During the second ERCP in August, 2019 patient had Spyglass cholangioscopy also.  The brushings from this ERCP were read as suspicious for malignancy.  These slides were reviewed at [facility] and it is read as showing ductal proliferation favor a reactive process.  He was then referred to hepatobiliary surgery and then sent him for an MRCP which showed a new stricture in the common hepatic duct which could either be benign or malignant.          Allergies/Sensitivities:  Allergies   Allergen Reactions    Adhesive Tape Rash    Sulfa Antibiotics Rash     Rash on legs        Medications:   Prior to Admission medications    Not on  File       Past Medical Hx:   Past Medical History:   Diagnosis Date    Anemia     Osteoporosis     Palpitations     PSC (primary sclerosing cholangitis)        Past Surgical Hx:   Past Surgical History:   Procedure Laterality Date    COLONOSCOPY      TOTAL COLECTOMY         Social Hx:   Social History     Tobacco Use    Smoking status: Not on file   Substance Use Topics    Alcohol use: Not on file       Family Hx: History reviewed. No pertinent family history of GI malignancy, IBD, liver disease, pancreatic disease, or celiac disease.    ROS:   General:  No malaise, fatigue, fever, chills, sweats.  HEENT: No hearing loss, visual changes.   Cardiovascular:  No chest pain, palpitations.  Respiratory:  No dyspnea, cough.  Musculoskeletal:  No myalgias.  GI:  See above.  GU:  No dysuria, hematuria.  Skin:  No rash.  Neuro:   No focal numbness, weakness, tremor.  Psychiatric:  No confusion, depression.  Endocrine:  No heat or cold intolerance.  Heme/Lymph:  No easy bruising/bleeding.  No concerning lumps.  Allergy/Rheum:  No arthralgias/arthritis.  No rash/hives.     Vitals:  Vitals:    12/01/18 1145   BP: 105/71   Pulse: 72   Weight: 57.5 kg (126 lb 11.2 oz)   Height: 175.3 cm (5' 9" )     Body mass index is 18.71 kg/m.    Physical Exam:   General: well appearing, NAD  Skin:  No rashes, jaundice.  Warm and dry.   HEENT:  No icterus.  No oropharyngeal abnormalities.    Neck:  No masses or tracheal deviation.  Supple.  Lungs:  Clear bilaterally to auscultation. Normal respiratory effort.    Cor:  RRR without murmur.    Abdomen:  Normal bowel sounds.  Non-tender, non-distended. No hepatosplenomegaly appreciated.   Extremities:  Warm, no edema.   Neuro:  Alert and oriented x 3 with appropriate affect.  Ambulatory.  No gross motor deficits noted.      GI Procedures:      ERCP 07/25/2018                ERCP in 11/2017                 (Cytology showed atypical cells due to reactive changes.)        [place] DEPT     FINAL DIAGNOSIS:   Outside material received from [facility],   [place], [place].     Common bile duct brushing ([id number] 2312):     Ductal proliferation, favor a reactive process.   No malignancy is seen. See comment.     Comment:   Cytologic preparations reveal several clusters of ductal cells   displaying nuclear enlargement and conspicuous nucleoli; however, most   of these cells have rounded regular membranes bland chromatin pattern,   and retention of nuclear polarity in well visualized groups.   Considering the patient's age, history of primary sclerosing   cholangitis, lack of a tumor/mass in imaging studies, and cytologic   findings, I favor a reactive process. A conservative approach, with   imaging studies and serum CA 19.9 levels is suggested.   The available patient's history was reviewed.   The case was also reviewed at cytology consensus conference on 9.18.19   and the above diagnosis was supported.   The case and diagnosis were discussed with Dr. Raliegh Ip. Tomiyama on 9.19.19.          Labs/Imaging:   All labs from the past 90 days   No visits with results within 90 Day(s) from this visit.   Latest known visit with results is:   Hospital Outpatient Visit on 08/30/2018   Component Date Value    WBC 08/30/2018 5.0     RBC 08/30/2018 5.6     Hemoglobin 08/30/2018 16.3     Hematocrit 08/30/2018 49     MCV 08/30/2018 89     MCH 08/30/2018 29     MCHC 08/30/2018 33     RDW 08/30/2018 12.0     Platelets 08/30/2018 236     Seg Neut % 08/30/2018 61.3     Lymphocyte % 08/30/2018 27.3     Monocyte % 08/30/2018 9.6     Eosinophil % 08/30/2018 0.8     Basophil % 08/30/2018 0.8     Neut # K/uL 08/30/2018 3.1     Lymph # K/uL 08/30/2018 1.4     Mono # K/uL 08/30/2018 0.5     Eos # K/uL 08/30/2018 0.0     Baso # K/uL 08/30/2018 0.0     Nucl RBC %  08/30/2018 0.0     Nucl RBC # K/uL 08/30/2018 0.0     IMM Granulocytes # 08/30/2018 0.0     IMM Granulocytes 08/30/2018 0.2      Sodium 08/30/2018 141     Potassium 08/30/2018 4.3     Chloride 08/30/2018 100     CO2 08/30/2018 28     Anion Gap 08/30/2018 13     UN 08/30/2018 13     Creatinine 08/30/2018 0.89     GFR,Caucasian 08/30/2018 106     GFR,Black 08/30/2018 122     Glucose 08/30/2018 90     Calcium 08/30/2018 9.4     Total Protein 08/30/2018 7.8*    Albumin 08/30/2018 4.6     Bilirubin,Total 08/30/2018 0.7     AST 08/30/2018 104*    ALT 08/30/2018 153*    Alk Phos 08/30/2018 388*    Protime 08/30/2018 10.8     INR 08/30/2018 0.9     CA 19 9 (eff. 11-2009) 08/30/2018 42*    AFP (eff. 02-2009) 08/30/2018 3     CEA 08/30/2018 2.6      MR abdomen without and with contrast   MR MRCP   Status: Final result     Reading Radiologist:   Layla Maw, MD  Signed By:  Layla Maw, MD on 09/21/2018 11:05 AM      Multifocal mild intrahepatic biliary ductal dilatation compatible with history of PSC, similar to prior imaging.       In addition, there is mild focal stricture involving the proximal common hepatic duct, without upstream biliary ductal dilatation, this could be a benign or malignant stricture.      Stable 3 cm complex cystic lesion in segment 2/3 with large amount of layering material but no enhancement.           Impression(s)/Recommendation(s):   This is a 43 y.o. 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  Patient is here for a consultation for an ERCP after his MRCP showed a new stricture in the common hepatic duct.  Currently symptomatic with fatigue and abdominal pain.  - Schedule ERCP with [facility]  - Hepatic Function Panel sent      Patient seen with and plan discussed with Dr.Tyronn Golda    Thank you for allowing [place] to participate in this patient's care.  Please do not hesitate to contact us with any questions or  concerns at [phone].      Annabell Howells, MD    I saw and evaluated the patient. I agree with the resident's/fellow's findings and plan of care as documented above. Details of my evaluation are as follows:         43 year old male followed for [place].    Past medical history of the patient is significant for ulcerative colitis, status post total colectomy with J-pouch anastomosis.  He has undergone tw ERCPs (11/2017, 06/2018).  Both the ERCPs have shown multiple strictures of the left and right hepatic ducts, upper third of the main bile duct.  Brushings in January, 2019 showed atypical cells most likely representing reactive changes.  During the second ERCP in August, 2019 patient had Spyglass cholangioscopy also.  The brushings from this ERCP were read as suspicious for malignancy.  These slides were reviewed at [facility] and it is read as showing ductal proliferation, favor  a reactive process.    Patient states that he was diagnosed with ulcerative colitis in 2004.  He was diagnosed with [place] in 2005 after undergoing an ERCP because of elevated liver function tests.  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    Patient has chronic elevation of liver function tests.  In December, 2018 there was worsening of liver function tests and he underwent ERCP in January, 2019 as described above.    On the day of visit patient is complaining of fatigue and tiredness.  He has intermittent lower abdominal pain about once a month that last for few days.  He usually has this pain when he is under stress.    His appetite is good and he denies weight loss.  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    He has a family history of IBD.  Two brothers has IBD and one has undergone colectomy. These two brothers do not have [place].Marland Kitchen    PMH:     Past Medical History:   Diagnosis Date    Anemia     Osteoporosis     Palpitations     PSC (primary sclerosing cholangitis)       Past Surgical History:   Procedure  Laterality Date    COLONOSCOPY      TOTAL COLECTOMY         Family History:   No family history on file.    Social History:     Social History     Tobacco Use    Smoking status: Not on file   Substance Use Topics    Alcohol use: Not on file       Medications:     Prior to Admission medications    Not on File       Allergies:     Allergies   Allergen Reactions    Adhesive Tape Rash    Sulfa Antibiotics Rash     Rash on legs        ROS:   General: +tiredness and fatigue  HEENT: No tinnitus, coryza, diplopia, dysgeusia.   Cardiovascular: No chest pain, palpitations.   Respiratory: No dyspnea, cough.   GI: See above and HPI.   GU: No dysuria, hematuria.   Skin: No rash, pruritus, jaundice.   Neuro: No focal numbness, weakness, tremor.   Psychiatric: No somnolence, confusion, dysphoria.   Endocrine: No heat or cold intolerance.  Heme/Lymph: No easy bruising/bleeding. No concerning lumps.       PHYSICAL EXAM:  Temp Readings from Last 3 Encounters:   08/30/18 36.4 C (97.5 F)     BP Readings from Last 3 Encounters:   12/01/18 105/71   08/30/18 126/78     Pulse Readings from Last 3 Encounters:   12/01/18 72   08/30/18 77         Vitals:   Vitals:    12/01/18 1145   BP: 105/71   Pulse: 72   Weight: 57.5 kg (126 lb 11.2 oz)   Height: 175.3 cm (5' 9" )     Body mass index is 18.71 kg/m.    General:NAD, cooperative, appears stated age  Skin: No rashes, jaundice, spider angiomata, palmar erythema, or telangiectasias. Warm and dry.   HEENT: NC/AT, EOMI, PERRLA, no glossitis, cheilosis, or icterus. No oropharyngeal abnormalities. No ulcers.  Neck: No masses, tracheal deviation, thyromegaly, or thyroid tenderness.   Lungs: Clear bilaterally to auscultation. Normal respiratory effort.   Cardiac: RRR without murmur, rub, or gallop.  No heave or thrill.   Abdomen: Normal bowel sounds, no bruits. No distention. Nontender.  Rectum: Deferred, as it was not pertinent to this evaluation.   Extremities: Warm, normal pedal pulses, good  capillary refill of nail beds, no edema.  Neuro: Alert and oriented x 3 with appropriate affect. No asterixis     LAB DATA:       Results for OTHO, MICHALIK (MRN [id number]) as of 12/01/2018 19:17   Ref. Range 08/17/2018 12:27 08/30/2018 16:42   Sodium Latest Ref Range: 133 - 145 mmol/L  141   Potassium Latest Ref Range: 3.3 - 5.1 mmol/L  4.3   Chloride Latest Ref Range: 96 - 108 mmol/L  100   CO2 Latest Ref Range: 20 - 28 mmol/L  28   Anion Gap Latest Ref Range: 7 - 16   13   UN Latest Ref Range: 6 - 20 mg/dL  13   Creatinine Latest Ref Range: 0.67 - 1.17 mg/dL  0.89   GFR,Black Latest Units: *  122   GFR,Caucasian Latest Units: *  106   Glucose Latest Ref Range: 60 - 99 mg/dL  90   Calcium Latest Ref Range: 9.0 - 10.3 mg/dL  9.4   Total Protein Latest Ref Range: 6.3 - 7.7 g/dL  7.8 (H)   Albumin Latest Ref Range: 3.5 - 5.2 g/dL  4.6   ALT Latest Ref Range: 0 - 50 U/L  153 (H)   AST Latest Ref Range: 0 - 50 U/L  104 (H)   Alk Phos Latest Ref Range: 40 - 130 U/L  388 (H)   Bilirubin,Total Latest Ref Range: 0.0 - 1.2 mg/dL  0.7   Protime Latest Ref Range: 10.0 - 12.9 sec  10.8   INR Latest Ref Range: 0.9 - 1.1   0.9   WBC Latest Ref Range: 4.2 - 9.1 THOU/uL  5.0   RBC Latest Ref Range: 4.6 - 6.1 MIL/uL  5.6   Hemoglobin Latest Ref Range: 13.7 - 17.5 g/dL  16.3   Hematocrit Latest Ref Range: 40 - 51 %  49   MCV Latest Ref Range: 79 - 92 fL  89   MCH Latest Ref Range: 26 - 32 pg/cell  29   MCHC Latest Ref Range: 32 - 37 g/dL  33   RDW Latest Ref Range: 11.6 - 14.4 %  12.0   Platelets Latest Ref Range: 150 - 330 THOU/uL  236   Neut # K/uL Latest Ref Range: 1.8 - 5.4 THOU/uL  3.1   Lymph # K/uL Latest Ref Range: 1.3 - 3.6 THOU/uL  1.4   Mono # K/uL Latest Ref Range: 0.3 - 0.8 THOU/uL  0.5   Eos # K/uL Latest Ref Range: 0.0 - 0.5 THOU/uL  0.0   Baso # K/uL Latest Ref Range: 0.0 - 0.1 THOU/uL  0.0   IMM Granulocytes # Latest Units: THOU/uL  0.0   Nucl RBC # K/uL Latest Ref Range: 0.0 - 0.0 THOU/uL  0.0   Seg Neut % Latest Units: %   61.3   Lymphocyte % Latest Units: %  27.3   Monocyte % Latest Units: %  9.6   Eosinophil % Latest Units: %  0.8   Basophil % Latest Units: %  0.8   IMM Granulocytes Latest Units: %  0.2   Nucl RBC % Latest Ref Range: 0.0 - 0.2 /100 WBC  0.0   AFP (eff. 02-2009) Latest Ref Range: 0 - 7 IU/mL  3  CA 19 9 (eff. 11-2009) Latest Ref Range: 0 - 35 U/mL  42 (H)   CEA Latest Ref Range: 0.0 - 4.7 ng/mL  2.6           07/28/2017    CA 19 9 = 29        ERCP 07/25/2018                ERCP in 11/2017                 (Cytology showed atypical cells due to reactive changes.)        [place] DEPT     FINAL DIAGNOSIS:   Outside material received from [facility],   [place], [place].     Common bile duct brushing ([id number] 2312):     Ductal proliferation, favor a reactive process.   No malignancy is seen. See comment.     Comment:   Cytologic preparations reveal several clusters of ductal cells   displaying nuclear enlargement and conspicuous nucleoli; however, most   of these cells have rounded regular membranes bland chromatin pattern,   and retention of nuclear polarity in well visualized groups.   Considering the patient's age, history of primary sclerosing   cholangitis, lack of a tumor/mass in imaging studies, and cytologic   findings, I favor a reactive process. A conservative approach, with   imaging studies and serum CA 19.9 levels is suggested.   The available patient's history was reviewed.   The case was also reviewed at cytology consensus conference on 9.18.19   and the above diagnosis was supported.   The case and diagnosis were discussed with Dr. Raliegh Ip. Tomiyama on 9.19.19.        IMAGING:     MR abdomen without and with contrast   MR MRCP   Status: Final result     Reading Radiologist:   Layla Maw, MD  Signed By:  Layla Maw, MD on 09/21/2018 11:05 AM      Multifocal mild intrahepatic biliary ductal dilatation compatible with history of PSC, similar to prior imaging.        In addition, there is mild focal stricture involving the proximal common hepatic duct, without upstream biliary ductal dilatation, this could be a benign or malignant stricture.      Stable 3 cm complex cystic lesion in segment 2/3 with large amount of layering material but no enhancement.             ASSESSMENT/PLAN     43 year old male referred for PSC.  Patient was diagnosed with ulcerative colitis in 2004 and [place] in 2005.  He underwent total colectomy in 2010.  In December, 2018 his LFTs which were chronically elevated, worsened and he underwent two ERCPs in 2019.  Brushings and biopsies from the last ERCP done in August, 2019 was read as suspicious for cholangiocarcinoma.  These slides were reviewed at [facility] the biopsies are read as showing atypical cells most likely reactive.    Patient is having symptoms of fatigue and tiredness.  He denies itching.  Blood work from October, 2019 still shows elevated liver function tests.  MRI/MRCP done in October, 2019 shows multiple intrahepatic duct strictures as well as stricture of the proximal common hepatic duct.  Because of abnormal findings in the last ERCP, we are going to schedule the patient for repeat ERCP with spyglass cholangioscopy.  Risks and benefits of the procedure were discussed  with the patient and his friend.  Patient is agreeable for the procedure.        Makinsley Schiavi.   Department of Gastroenterology and Hepatology

## 2018-12-01 ENCOUNTER — Ambulatory Visit
Admission: RE | Admit: 2018-12-01 | Discharge: 2018-12-01 | Disposition: A | Discharge status: Home / Self Care | Payer: PRIVATE HEALTH INSURANCE | Source: Ambulatory Visit | Attending: Student in an Organized Health Care Education/Training Program | Admitting: Student in an Organized Health Care Education/Training Program

## 2018-12-01 ENCOUNTER — Telehealth: Payer: Self-pay | Admitting: Gastroenterology

## 2018-12-01 ENCOUNTER — Encounter: Payer: Self-pay | Admitting: Gastroenterology

## 2018-12-01 VITALS — BP 105/71 | HR 72 | Ht 69.0 in | Wt 126.7 lb

## 2018-12-01 DIAGNOSIS — K8301 Primary sclerosing cholangitis: Secondary | ICD-10-CM

## 2018-12-01 DIAGNOSIS — K831 Obstruction of bile duct: Secondary | ICD-10-CM

## 2018-12-01 HISTORY — DX: Palpitations: R00.2

## 2018-12-01 HISTORY — DX: Age-related osteoporosis without current pathological fracture: M81.0

## 2018-12-01 HISTORY — DX: Anemia, unspecified: D64.9

## 2018-12-01 NOTE — Discharge Instructions (Signed)
Schedule ERCP  Get bloodwork (Liver function tests)

## 2018-12-01 NOTE — Telephone Encounter (Signed)
Dr Foye Spurling would like pt scheduled for ERCP with [facility], Pt was given instructions.

## 2018-12-05 NOTE — Telephone Encounter (Signed)
This is ERCP w/spyglass per note.  Do you want this scheduled next available?

## 2018-12-05 NOTE — Telephone Encounter (Signed)
In 4 weeks

## 2018-12-06 ENCOUNTER — Encounter: Payer: Self-pay | Admitting: Gastroenterology

## 2018-12-06 NOTE — Telephone Encounter (Signed)
Pt is scheduled and notified for 2.11

## 2019-01-05 ENCOUNTER — Telehealth: Payer: Self-pay

## 2019-01-06 ENCOUNTER — Other Ambulatory Visit: Payer: Self-pay | Admitting: Gastroenterology

## 2019-01-06 NOTE — Telephone Encounter (Signed)
Pre procedure screening call completed. Spoke with patient regarding arrival time of 0830, NPO after MN and need for a driver home. Patient verbalized understanding.

## 2019-01-09 ENCOUNTER — Encounter: Payer: Self-pay | Admitting: Gastroenterology

## 2019-01-09 NOTE — Anesthesia Preprocedure Evaluation (Addendum)
Anesthesia Pre-operative History and Physical for Kyle Bright    Highlighted Issues for this Procedure:  43 y.o. male presenting for ERCP under anesthesia w/ Dr. Foye Spurling    Allergies:   -- Adhesive Tape -- Rash   -- Asacol (Mesalamine) -- Rash    --  Per patient is also allergic to this drug   -- Sulfa Antibiotics -- Rash    --  Rash on legs    [place]:  - Estimated body mass index is 18.71 kg/m as calculated from the following:     Height as of 12/01/18: 1.753 m (5\' 9" ).    Weight as of 12/01/18: 57.5 kg (126 lb 11.2 oz).    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 No previous anesthetic records were available for comparison, past hx of ERCP and [facility] for colectomy            .  Marland Kitchen  Anesthesia Evaluation Information Source: records, family, patient     ANESTHESIA HISTORY     Denies anesthesia history  Pertinent(-):  No History of anesthetic complications or Family hx of anesthetic complications    GENERAL     Denies general issues    HEENT     Denies HEENT issues PULMONARY     Denies pulmonary issues  Pertinent(-):  No smoking, asthma or sleep apnea    CARDIOVASCULAR  Good(4+METs) Exercise Tolerance  Pertinent(-):  No hypertension or angina    GI/HEPATIC/RENAL  Last PO Intake: >8hr before procedure    + Bowel Issues (primary sclerosing cholangitis, UC s/p total colectomy)          ulcerative colitis  Pertinent(-):  No GERD  NEURO/PSYCH     Denies neuro/psych issues  Pertinent(-):  No seizures or cerebrovascular event                 Physical Exam    Airway            Mouth opening: normal            Mallampati: I            TM distance (fb): >3 FB            Neck ROM: full  Dental   Normal Exam   Cardiovascular           Rhythm: regular           Rate: normal      Neurologic    Normal Exam     Pulmonary      breath sounds clear to auscultation           ________________________________________________________________________  PLAN  ASA Score  2  Anesthetic Plan general     Induction (routine IV) General Anesthesia/Sedation Maintenance Plan (inhaled agents);  Airway Manipulation (direct laryngoscopy); Airway (cuffed ETT); Line ( use current access); Monitoring (standard ASA); Positioning (prone); PONV Plan (dexamethasone and ondansetron); Pain (per surgical team); PostOp (PACU)    Informed Consent     Risks:          Risks discussed were commensurate with the plan listed above with the following specific points: N/V and sore throat, Damage to: teeth, allergic Rx, unexpected serious injury and death, Risks of allergic reactions, heart-lung problems, and life-threatening events  discussed.  .    Anesthetic Consent:         Anesthetic plan (and risks as noted above) were discussed with patient and spouse    Blood products Consent:        Use of blood products discussed with: patient and spouse     Plan also discussed with team members including:       surgeon and resident    Attending Attestation:  As the primary attending anesthesiologist, I attest that the patient or proxy understands and accepts the risks and benefits of the anesthesia plan. I also attest that I have personally performed a pre-anesthetic examination and evaluation, and prescribed the anesthetic plan for this particular location within 48 hours prior to the anesthetic as documented. Celestia Khat, MD 10:30 AM

## 2019-01-10 ENCOUNTER — Encounter: Payer: Self-pay | Admitting: Student in an Organized Health Care Education/Training Program

## 2019-01-10 ENCOUNTER — Ambulatory Visit: Payer: PRIVATE HEALTH INSURANCE | Admitting: Student in an Organized Health Care Education/Training Program

## 2019-01-10 ENCOUNTER — Ambulatory Visit
Admission: RE | Admit: 2019-01-10 | Discharge: 2019-01-10 | Disposition: A | Discharge status: Home / Self Care | Payer: PRIVATE HEALTH INSURANCE | Source: Ambulatory Visit | Attending: Gastroenterology | Admitting: Gastroenterology

## 2019-01-10 ENCOUNTER — Other Ambulatory Visit: Payer: Self-pay

## 2019-01-10 ENCOUNTER — Encounter: Payer: Self-pay | Admitting: Gastroenterology

## 2019-01-10 DIAGNOSIS — K8301 Primary sclerosing cholangitis: Secondary | ICD-10-CM

## 2019-01-10 DIAGNOSIS — D135 Benign neoplasm of extrahepatic bile ducts: Secondary | ICD-10-CM | POA: Insufficient documentation

## 2019-01-10 DIAGNOSIS — Z8379 Family history of other diseases of the digestive system: Secondary | ICD-10-CM | POA: Insufficient documentation

## 2019-01-10 DIAGNOSIS — K743 Primary biliary cirrhosis: Secondary | ICD-10-CM | POA: Insufficient documentation

## 2019-01-10 DIAGNOSIS — K838 Other specified diseases of biliary tract: Secondary | ICD-10-CM | POA: Insufficient documentation

## 2019-01-10 DIAGNOSIS — K831 Obstruction of bile duct: Secondary | ICD-10-CM

## 2019-01-10 MED ORDER — OXYCODONE HCL 5 MG/5ML PO SOLN *I*
5.0000 mg | Freq: Once | ORAL | Status: DC | PRN
Start: 2019-01-10 — End: 2019-01-11

## 2019-01-10 MED ORDER — INDOMETHACIN 50 MG RE SUPP *I*
RECTAL | Status: AC | PRN
Start: 2019-01-10 — End: 2019-01-10
  Administered 2019-01-10: 100 mg via RECTAL

## 2019-01-10 MED ORDER — LACTATED RINGERS IV SOLN *I*
100.0000 mL/h | INTRAVENOUS | Status: DC
Start: 2019-01-10 — End: 2019-01-11
  Administered 2019-01-10: 100 mL/h via INTRAVENOUS

## 2019-01-10 MED ORDER — ALBUTEROL SULFATE (2.5 MG/3ML) 0.083% IN NEBU *I*
2.5000 mg | INHALATION_SOLUTION | Freq: Once | RESPIRATORY_TRACT | Status: DC | PRN
Start: 2019-01-10 — End: 2019-01-10

## 2019-01-10 MED ORDER — PIPERACILLIN SOD-TAZOBACTAM SO 2.25 GM IV SOLR *I*
INTRAVENOUS | Status: DC | PRN
  Administered 2019-01-10: 3.375 g via INTRAVENOUS

## 2019-01-10 MED ORDER — ONDANSETRON HCL 2 MG/ML IV SOLN *I*
INTRAMUSCULAR | Status: DC | PRN
Start: 2019-01-10 — End: 2019-01-10
  Administered 2019-01-10: 4 mg via INTRAMUSCULAR

## 2019-01-10 MED ORDER — OXYCODONE HCL 5 MG/5ML PO SOLN *I*
10.0000 mg | Freq: Once | ORAL | Status: DC | PRN
Start: 2019-01-10 — End: 2019-01-11

## 2019-01-10 MED ORDER — SUGAMMADEX SODIUM 100 MG/1ML IV SOLN *WRAPPED*
INTRAVENOUS | Status: DC | PRN
Start: 2019-01-10 — End: 2019-01-10
  Administered 2019-01-10: 200 mg via INTRAVENOUS

## 2019-01-10 MED ORDER — ONDANSETRON HCL 2 MG/ML IV SOLN *I*
1.0000 mg | Freq: Once | INTRAMUSCULAR | Status: DC | PRN
Start: 2019-01-10 — End: 2019-01-10

## 2019-01-10 MED ORDER — FENTANYL CITRATE 50 MCG/ML IJ SOLN *WRAPPED*
INTRAMUSCULAR | Status: AC
Start: 2019-01-10 — End: 2019-01-10
  Filled 2019-01-10: qty 2

## 2019-01-10 MED ORDER — AMOXICILLIN-POT CLAVULANATE 500-125 MG PO TABS *I*
1.0000 | ORAL_TABLET | Freq: Two times a day (BID) | ORAL | 0 refills | Status: DC
Start: 2019-01-10 — End: 2019-12-28

## 2019-01-10 MED ORDER — MIDAZOLAM HCL 1 MG/ML IJ SOLN *I* WRAPPED
INTRAMUSCULAR | Status: AC
Start: 2019-01-10 — End: 2019-01-10
  Filled 2019-01-10: qty 2

## 2019-01-10 MED ORDER — DEXAMETHASONE SODIUM PHOSPHATE 4 MG/ML INJ SOLN *WRAPPED*
INTRAMUSCULAR | Status: DC | PRN
Start: 2019-01-10 — End: 2019-01-10
  Administered 2019-01-10: 4 mg via INTRAVENOUS

## 2019-01-10 MED ORDER — PROPOFOL 10 MG/ML IV EMUL (INTERMITTENT DOSING) WRAPPED *I*
INTRAVENOUS | Status: DC | PRN
Start: 2019-01-10 — End: 2019-01-10
  Administered 2019-01-10: 120 mg via INTRAVENOUS

## 2019-01-10 MED ORDER — ROCURONIUM BROMIDE 10 MG/ML IV SOLN *WRAPPED*
Status: DC | PRN
Start: 2019-01-10 — End: 2019-01-10
  Administered 2019-01-10: 40 mg via INTRAVENOUS

## 2019-01-10 MED ORDER — MIDAZOLAM HCL 1 MG/ML IJ SOLN *I* WRAPPED
INTRAMUSCULAR | Status: DC | PRN
Start: 2019-01-10 — End: 2019-01-10
  Administered 2019-01-10: 2 mg via INTRAVENOUS

## 2019-01-10 MED ORDER — FENTANYL CITRATE 50 MCG/ML IJ SOLN *WRAPPED*
INTRAMUSCULAR | Status: DC | PRN
Start: 2019-01-10 — End: 2019-01-10
  Administered 2019-01-10: 100 ug via INTRAVENOUS

## 2019-01-10 MED ORDER — LIDOCAINE HCL 2 % IJ SOLN *I*
INTRAMUSCULAR | Status: DC | PRN
Start: 2019-01-10 — End: 2019-01-10
  Administered 2019-01-10: 60 mg via INTRAVENOUS

## 2019-01-10 MED ORDER — HALOPERIDOL LACTATE 5 MG/ML IJ SOLN *I*
0.5000 mg | Freq: Once | INTRAMUSCULAR | Status: DC | PRN
Start: 2019-01-10 — End: 2019-01-10

## 2019-01-10 MED ORDER — HYDROMORPHONE HCL 2 MG/ML IJ SOLN *WRAPPED*
0.5000 mg | INTRAMUSCULAR | Status: DC | PRN
Start: 2019-01-10 — End: 2019-01-10

## 2019-01-10 NOTE — Discharge Instructions (Signed)
Gastroenterology Unit  Discharge Instructions for ERCP     01/10/2019     12:07 PM    ERCP with biopsies.     Do not drive, operate heavy machinery, drink alcoholic beverages, make important personal or business decisions, or sign legal documents until the next day.      Return to your usual diet    Things you May Expect:    A mild sore throat (Warm liquids or lozenges will soothe the throat)    You were given medication to help you relax during the test.     You need to rest at home for at least 4-6 hours.    You should Call Your Doctor for Any of the Following:    Severe lightheadedness, chills, abdominal pain, black or red stools    Fever    Continuous nausea, vomiting, or bloody vomiting.    Pain or redness at IV site    If you have a serious problem after hours,   Call [phone] to reach the GI physician on call.  If you are unable to reach your doctor, go to the [facility] Emergency Department.    Follow Up Care:   Follow up with PCP.   Augmentin for 5 days.          New Prescriptions    AMOXICILLIN-CLAVULANATE (AUGMENTIN) 500-125 MG PER TABLET    Take 1 tablet by mouth 2 times daily

## 2019-01-10 NOTE — Anesthesia Case Conclusion (Signed)
CASE CONCLUSION  Emergence  Actions:  Suctioned, soft bite block and extubated  Criteria Used for Airway Removal:  Adequate Tv & RR, acceptable O2 saturation and following commands  Assessment:  Routine  Transport  Directly to: PACU  Airway:  Nasal cannula  Oxygen Delivery:  2 lpm  Position:  Recumbent  Patient Condition on Handoff  Level of Consciousness:  Alert/talking/calm  Patient Condition:  Stable  Handoff Report to:  RN

## 2019-01-10 NOTE — Procedures (Signed)
Procedure Report    Endoscopic Retrograde Cholangiopancreatography Note   Date of Procedure: 01/10/2019   Referring Physician:   No ref. provider found  Primary Care Provider: Unknown, Provider   Attending Physician: Juline Patch, MD  Fellow: None    Indication: [place] with biliary strictures. Biopsies in the past has been read as suspicious for malignancy.         Preprocedure Evaluation: The patient was stable for sedation and endoscopy. An informed consent was obtained prior to the procedure, including risks and benefits of dilation, control of hemorrhage and tissue removal.     Medications: GA were administered incrementally over the course of the procedure to achieve an adequate level of conscious sedation.           Endoscope: Olympus TJF-160VF        Procedure Details: The patient was placed in prone position and monitored continuously with automatic blood pressure monitoring, ECG tracing, pulse oximetry, and direct observations. The Olympus TJF-160 VF was inserted into the oropharynx and positioned in front of the papilla in the second portion of the duodenum. Findings and intervention are detailed below.   The patient was recovered in the GI recovery area              Findings:     Duodenal ampulla showed evidence of sphincterotomy.  CBD was cannulated with a balloon catheter and cholangiogram was performed.  Cholangiogram showed mild diffuse narrowing of the common bile duct.  No definite stricture of common hepatic duct or common bile duct was seen.  Strictures were seen in the right and left hepatic duct and intrahepatic ducts.  The intrahepatic ducts had a beaded appearance.    A spyglass cholangioscope was introduced into the common bile duct. The common bile duct and the common hepatic duct up to the bifurcation was carefully examined.  No obvious mass was seen.  The lining of the common hepatic duct and common bile duct showed scarring.  Multiple biopsies were taken from the common hepatic duct and  common bile duct.  Brushings were obtained from the common hepatic duct and common bile duct.  Spyglass cholangioscope was withdrawn.  CBD was again cannulated with a balloon catheter and cholangiogram was performed.  No extravasation of contrast was seen.  Patient tolerated the procedure well.    Therapeutic Interventions:   As above         Complications: none    Estimated Blood Loss: 2 in mL    Impression:   1.  No definite stricture of common bile duct or common hepatic duct seen.  There is mild diffuse narrowing of common bile duct.   2.  Strictures of the right hepatic duct, left hepatic duct and intrahepatic duct seen.  The intrahepatic ducts had beaded appearance.  3.  Cholangioscopy shows diffuse scarring of the common hepatic duct and common bile duct.  No definite mass was seen.  Multiple biopsies taken.  Recommendations:    Await final pathology results    Follow up with liver transplant.    Augmentin for 5 days.     Rachal Dvorsky Foye Spurling, MD

## 2019-01-10 NOTE — Anesthesia Postprocedure Evaluation (Signed)
Anesthesia Post-Op Note    Patient: Kyle Bright    Procedure(s) Performed:  Procedure Summary  Date:  01/10/2019 Anesthesia Start: 01/10/2019 10:13 AM Anesthesia Stop: 01/10/2019 11:53 AM Room / Location:  * No operating room entered * / [place] [facility] PROCEDURES   * No procedures listed * Diagnosis:  * No pre-op diagnosis entered * * No surgeons listed * Attending Anesthesiologist:  Celestia Khat, MD         Recovery Vitals  BP: 97/68 (01/10/2019 12:45 PM)  Heart Rate: 103 (01/10/2019  9:31 AM)  Heart Rate (via Pulse Ox): 69 (01/10/2019 12:45 PM)  Resp: 16 (01/10/2019 12:31 PM)  Temp: 36.5 C (97.7 F) (01/10/2019 11:53 AM)  SpO2: 97 % (01/10/2019 12:45 PM)  O2 Flow Rate: 0 L/min (01/10/2019 12:45 PM)   0-10 Scale: 0 (01/10/2019 12:45 PM)    Anesthesia type:  general  Complications Noted During Procedure or in PACU:  None   Comment:    Patient Location:  PACU  Level of Consciousness:    Recovered to baseline  Patient Participation:     Able to participate  Temperature Status:    Normothermic  Oxygen Saturation:    Within patient's normal range  Cardiac Status:   Within patient's normal range  Fluid Status:    Stable  Airway Patency:     Yes  Pulmonary Status:    Baseline  Pain Management:    Adequate analgesia  Nausea and Vomiting:  None    Post Op Assessment:    Tolerated procedure well and no evidence of recall"   Attending Attestation:  All indicated post anesthesia care provided       -

## 2019-01-10 NOTE — Preop H&P (Signed)
OUTPATIENT PRE-PROCEDURE H&P    Chief Complaint / Indications for Procedure: PSC r/o tumor    Past Medical History:     Past Medical History:   Diagnosis Date    Anemia     Osteoporosis     Palpitations     PSC (primary sclerosing cholangitis)      Past Surgical History:   Procedure Laterality Date    COLONOSCOPY      TOTAL COLECTOMY       Family History   Problem Relation Age of Onset    Crohn's disease Brother     Ulcerative colitis Brother      Social History     Socioeconomic History    Marital status: Unknown     Spouse name: Not on file    Number of children: Not on file    Years of education: Not on file    Highest education level: Not on file   Tobacco Use    Smoking status: Never Smoker    Smokeless tobacco: Never Used   Substance and Sexual Activity    Alcohol use: Not Currently    Drug use: Not Currently    Sexual activity: Not on file   Other Topics Concern    Not on file   Social History Narrative    Not on file       Allergies:    Allergies   Allergen Reactions    Adhesive Tape Rash    Sulfa Antibiotics Rash     Rash on legs        Medications:  (Not in a hospital admission)     Current Outpatient Medications   Medication    mesalamine (LIALDA) 1.2 GM EC tablet    loperamide (IMODIUM) 2 MG capsule    multi-vitamin (TAB-A-VITE/BETA CAROTENE) per tablet    calcium-vitamin D (OSCAL-500) 500-200 MG-UNIT per tablet    cholecalciferol (VITAMIN D) 50 MCG (2000 UT) tablet    Probiotic Product (VSL#3) CAPS     Current Facility-Administered Medications   Medication Dose Route Frequency    Lactated Ringers Infusion  100 mL/hr Intravenous Continuous     Vitals:    01/10/19 0931   BP: 107/77   Pulse: 103   Resp: 16   Temp: 37.1 C (98.8 F)   Weight: 54.4 kg (120 lb)   Height: 175.3 cm (_0 )       ROS:  GI:See HPI.    Physical Examination:  Head/Nose/Throat:negative  Lungs:Lungs clear  Cardiovascular:normal S1 and S2  Abdomen: abdomen soft, non-tender, nondistended, normal active bowel  sounds, no masses or organomegaly      Lab Results: none    Radiology impressions (last 30 days):  No results found.    Currently Active Problems:  There is no problem list on file for this patient.       Impression:  PSC    Plan:  ERCP  MAC    UPDATES TO PATIENT'S CONDITION on the DAY OF SURGERY/PROCEDURE    I. Updates to Patient's Condition (to be completed by a provider privileged to complete a H&P, following reassessment of the patient by the provider):    Full H&P done today; no updates needed.    II. Procedure Readiness   I have reviewed the patient's H&P and updated condition. By completing and signing this form, I attest that this patient is ready for surgery/procedure.      III. Attestation   I have reviewed the updated information regarding the patient's  condition and it is appropriate to proceed with the planned surgery/procedure.    Dava Rensch Foye Spurling, MD as of 9:47 AM 01/10/2019

## 2019-01-10 NOTE — Anesthesia Procedure Notes (Signed)
---------------------------------------------------------------------------------------------------------------------------------------    AIRWAY   GENERAL INFORMATION AND STAFF    Patient location during procedure: Procedure Rm       Date of Procedure: 01/10/2019 10:38 AM  CONDITION PRIOR TO MANIPULATION     Current Airway/Neck Condition:  Normal        For more airway physical exam details, see Anesthesia PreOp Evaluation  AIRWAY METHOD     Patient Position:  Sniffing    Preoxygenated: yes      Induction: IV  Mask Difficulty Assessment:  1 - vent by mask      Technique Used for Successful ETT Placement:  Direct laryngoscopy    Blade Type:  Miller    Laryngoscope Blade/Video laryngoscope Blade Size:  2    Cormack-Lehane Classification:  Grade I - full view of glottis    Placement Verified by: capnometry, auscultation and equal breath sounds      Number of Attempts at Approach:  1    Number of Other Approaches Attempted:  0  FINAL AIRWAY DETAILS    Final Airway Type:  Endotracheal airway    Final Endotracheal Airway:  ETT    Insertion Site:  Oral    ETT Size (mm):  7.5  ----------------------------------------------------------------------------------------------------------------------------------------

## 2019-01-11 LAB — MEDICAL CYTOLOGY

## 2019-02-13 NOTE — Telephone Encounter (Signed)
Copied from [place] [id number]. Topic: Access to Care - Medical Records Request  >> Feb 13, 2019 10:47 AM Harland Dingwall wrote:  Kyle Bright from [facility] calling for records of the 2-11 ERCP results and notes faxed to [phone]   .

## 2019-02-13 NOTE — Telephone Encounter (Signed)
faxed

## 2019-03-06 ENCOUNTER — Telehealth: Payer: Self-pay

## 2019-03-06 ENCOUNTER — Telehealth: Payer: Self-pay | Admitting: Gastroenterology

## 2019-03-06 NOTE — Telephone Encounter (Signed)
I send a message to Dr. Vickii Penna weeks ago that the patient needs to be seen in the liver transplant but did not received any response. If I remember I probably talked with the patient. They need to follow up with surgical liver transplant.

## 2019-03-06 NOTE — Telephone Encounter (Signed)
Called pt and discussed that he needs to contact Dr.Tomiyama in surgical liver transplant, per Dr. Foye Spurling. Pt said he has his number and contacted them this morning.

## 2019-03-06 NOTE — Telephone Encounter (Signed)
Copied from [place] [id number]. Topic: Access to Care - Speak to Provider/Office Staff  >> Mar 06, 2019 12:37 PM Noralee Space B wrote:  .  Patient calling inquiring the next step in his care after 2/11 ERCP. Patient can be reached at [phone]

## 2019-03-06 NOTE — Telephone Encounter (Signed)
Took call from patient stating that he was told to schedule an appointment with Dr. Vickii Penna, to follow-up from an ERCP that he had with Dr. Foye Spurling. Please call him at [phone] to schedule.

## 2019-03-07 NOTE — Telephone Encounter (Signed)
Confirmed telehome appointment scheduled with Dr. Vickii Penna for 4/21 @ 2:00

## 2019-03-21 ENCOUNTER — Ambulatory Visit: Payer: PRIVATE HEALTH INSURANCE | Admitting: Transplant Surgery Liver and Kidney

## 2019-03-21 DIAGNOSIS — K5190 Ulcerative colitis, unspecified, without complications: Secondary | ICD-10-CM

## 2019-03-21 DIAGNOSIS — K8301 Primary sclerosing cholangitis: Secondary | ICD-10-CM

## 2019-03-21 NOTE — Patient Instructions (Addendum)
It was a pleasure talking with you today.     Some of the highlights of the visit are:    Our plan is for you to see a hepatologist Dr Lara Mulch in [place] to further discuss liver transplant especially live donor.      We will update blood work which he will have done locally so requisition will be mailed to you.       Will schedule MRI/MRCP in August to assess for cholangiocarcinoma and dominant stricture.

## 2019-03-21 NOTE — Progress Notes (Addendum)
Hepatology Follow up note    Chief Complaint  Kyle Bright is a 43 y.o. male who presents  on 03/21/2019 for follow-up care.     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  He underwent MRI/MRCP on 09/20/18    Multifocal mild intrahepatic biliary ductal dilatation compatible with history of PSC, similar to prior imaging.       In addition, there is mild focal stricture involving the proximal common hepatic duct, without upstream biliary ductal dilatation, this could be a benign or malignant stricture.      Stable 3 cm complex cystic lesion in segment 2/3 with large amount of layering material but no enhancement     This was followed by ERCP by Dr Foye Spurling on 01/10/19 that showed mild diffuse narrowing of the CBD, and strictures in the right and left intrahepatic ducts in a beaded appearance.  Spyglass was performed of the CBD and CHD and there was no obvious mass, no definite stricture of CBD or CHD  there was scarring. Multiple biopsies were taken .     The pathology showed low grade dysplasia with some histologic features concerning for but not diagnostic of high grade dysplasia.  No malignancy.     Today he has no complaints or symptoms except some pruritis.     History of the Present Illness  He complains of fatigue and myalgias as well as occasional diarrhea. Symptoms began worsening around the end of 2018 and the patient was found to have elevated transaminases. He underwent ERCP with dilation of strictured areas and brushings and had subsequent improvement in his transaminitis. He began to feel worse in August and was again found to have elevated LFTs. He underwent repeat ERCP in August of 2019 which was concerning for reactive changes. There was concern for possible cholangiocarcinoma  by the gastroenterology group and therefore the patient was referred for the potential for additional workup.    Medical History  Past Medical History:   Diagnosis Date    Anemia     Osteoporosis     Palpitations     PSC (primary sclerosing cholangitis)      Surgical History  Past Surgical History:   Procedure Laterality Date    COLONOSCOPY      TOTAL COLECTOMY       Family History  Family History   Problem Relation Age of Onset    Crohn's disease Brother     Ulcerative colitis Brother      Social History  Social History     Socioeconomic History    Marital status: Unknown     Spouse name: Not on file    Number of children: Not on file    Years of education: Not on file    Highest education level: Not on file   Occupational History    Not on file   Tobacco Use    Smoking status: Never Smoker    Smokeless tobacco: Never Used   Substance and Sexual Activity    Alcohol use: Not Currently    Drug use: Not Currently    Sexual activity: Not on file   Social History Narrative    Not on file  Medications  Outpatient Encounter Medications as of 03/21/2019   Medication Sig Dispense Refill    amoxicillin-clavulanate (AUGMENTIN) 500-125 MG per tablet Take 1 tablet by mouth 2 times daily 10 tablet 0    mesalamine (LIALDA) 1.2 GM EC tablet Take 1.2 g by mouth daily (with breakfast)       loperamide (IMODIUM) 2 MG capsule Take 2 mg by mouth      multi-vitamin (TAB-A-VITE/BETA CAROTENE) per tablet Take 1 tablet by mouth every morning       calcium-vitamin D (OSCAL-500) 500-200 MG-UNIT per tablet Take 1 tablet by mouth every morning       cholecalciferol (VITAMIN D) 50 MCG (2000 UT) tablet Take 2,000 Units by mouth every morning       Probiotic Product (VSL#3) CAPS Take 1 capsule by mouth every morning        No facility-administered encounter medications on file as of 03/21/2019.       Allergies  Allergies   Allergen Reactions    Adhesive Tape Rash    Sulfa Antibiotics Rash     Rash on legs         Review of Systems  ROS    Physical Exam  There were no vitals taken for this visit.    Physical Exam    Telemedicine visit.     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  - Levels examined.  COMMENT: H&E sections demonstrate superficial fragments of biliary type mucosa; in focal areas the biliary  epithelium demonstrates nuclear enlargement, hyperchromasia, and irregular nuclear borders, consistent with lowgrade  dysplasia (LG BilIN). There are with some histologic features seen concerning for, but not unequivocally  diagnostic of high-grade dysplasia. No evidence of invasive malignancy is seen in the above biopsy. Immunostain for  p53 demonstrates focal, weak nuclear expression    Imaging Data  01/10/19 ERCP  Impression:   1. No definite stricture of common bile duct or common hepatic   duct seen. There is mild diffuse narrowing of common bile duct.   2. Strictures of the right hepatic duct, left hepatic duct and   intrahepatic duct seen. The intrahepatic ducts had beaded   appearance.  3. Cholangioscopy shows diffuse scarring of the common hepatic   duct and common bile duct. No definite mass was seen. Multiple   biopsies taken.  Recommendations:    Await final pathology results    Follow up with liver transplant.    Augmentin for 5 days.     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 He has undergone ERCPs at OSH in the past that have been concerning for cholangiocarcinoma.   ERCP by Dr Foye Spurling on 01/10/19 that showed mild diffuse narrowing of the CBD, and strictures in the right and left intrahepatic ducts in a beaded appearance.  Spyglass was performed of the CBD and CHD and there was no obvious mass, no definite stricture of CBD or CHD  there was scarring. Multiple biopsies were  taken .     The pathology showed low grade dysplasia with some histologic features concerning for but not diagnostic of high grade dysplasia.  No malignancy. All of this was discussed with him today.  He presently does not have cancer but the fear is he will develop cholangiocarcinoma in the  future.  With this in mind we discussed liver transplant evaluation, especially with live donor because his MELD score is so low and unlike to attract a deceased donor.      Our plan is for him to see a hepatologist Dr Lara Mulch in [place] to further discuss liver transplant and initiate transplant evaluation process.      We will update his blood work which he will have done locally so requisition will be mailed to him.      Will schedule him for MRI/MRCP in August to assess for cholangiocarcinoma and dominant stricture. We will see him back once follow up MRI is obtained.      I saw, examined and evaluated the patient, reviewed the patient chart and the relevant images and lab findings. Butch Penny, NP has acted as a Education administrator for this encounter at this office visit.     I formulated the assessment plan and directed the care of this patient. My detailed recommendations and opinion are documented in the above edited note which reflect the management plans. The patient is agreeable with this plan.  Thank you for allowing [place] to participate in this patient's care. Please do not hesistate to contact us with any questions or concerns at [phone].     Dr Elvin So

## 2019-03-22 ENCOUNTER — Telehealth: Payer: Self-pay

## 2019-03-22 NOTE — Telephone Encounter (Signed)
Called patient to confirm upcomming appointment     Date & Time : 6/23 @ 11am   With : Dr Kimberlee Nearing in [place]     Date & Time : 8/3 @ 11:30am   With : [place] : MRI     Mailed lab orders per request

## 2019-03-23 ENCOUNTER — Encounter: Payer: Self-pay | Admitting: Transplant Surgery Liver and Kidney

## 2019-03-23 ENCOUNTER — Telehealth: Payer: Self-pay

## 2019-03-24 NOTE — Telephone Encounter (Signed)
Left message for patient to call and set up Televideo for 1-2 weeks with Dr. Kimberlee Nearing, [facility].

## 2019-05-05 ENCOUNTER — Encounter: Payer: Self-pay | Admitting: Gastroenterology

## 2019-05-08 ENCOUNTER — Telehealth: Payer: Self-pay

## 2019-05-08 ENCOUNTER — Other Ambulatory Visit: Payer: Self-pay | Admitting: Transplant

## 2019-05-08 DIAGNOSIS — K8301 Primary sclerosing cholangitis: Secondary | ICD-10-CM

## 2019-05-08 DIAGNOSIS — K5190 Ulcerative colitis, unspecified, without complications: Secondary | ICD-10-CM

## 2019-05-08 LAB — PROTIME-INR: INR: 1

## 2019-05-08 NOTE — Telephone Encounter (Signed)
Called patient to confirm upcomming appointment     Date & Time : 8/4 @ 12:30   With : Dr Vickii Penna   For : [facility]UV to MRI 8/3    Let him know he can call the office if he would like info for guest svcs/[facility]

## 2019-05-08 NOTE — Telephone Encounter (Signed)
Calling with critical labs-please call back and ask for Bridgette.

## 2019-05-08 NOTE — Telephone Encounter (Signed)
Critical labs ALT 298, ALK 921, GGT 607. She will fax labs to me. Appt soon Herminio [facility] , [place] March, MRI/MRCP august.

## 2019-05-09 LAB — COMPREHENSIVE METABOLIC PANEL
ALT: 288 U/L
AST: 207 U/L
Albumin: 4.5 g/dL
Alk Phos: 921 U/L
Anion Gap: 9
Bilirubin,Total: 1.5 mg/dL
CO2: 26 mmol/L
Calcium: 9.9 mg/dL
Chloride: 99 mmol/L
Creatinine: 0.8 mg/dL
GFR,Black: 128 *
GFR,Caucasian: 108 *
Glucose: 109 mg/dL
Lab: 13 mg/dL
Potassium: 4.1 mmol/L
Sodium: 134 mmol/L
Total Protein: 7.8 g/dL

## 2019-05-17 ENCOUNTER — Telehealth: Payer: Self-pay

## 2019-05-17 NOTE — Telephone Encounter (Signed)
Called and spoke with Olen Cordial, converted appt with Dr Kimberlee Nearing at [facility] on 06/23 to zoom

## 2019-05-17 NOTE — Telephone Encounter (Signed)
Due to COVID-19 and the risks associated with it we are reaching out to see if you would like to change your appointment to a telemed-video visit with your provider. The provider will contact you via an application called zoom for video appointment (we will forward you the information) at the same time as your scheduled in-person appointment. Or via telephone if a phone visit is required. Information shared with your provider during this visit will be safeguarded just as it would during an in-person visit. There may be a fee associated with this but its typically lower than the cost of an in-person appointment.    If your support person does not live with you. You are responsible for sending the zoom meeting information to them.     Your visit is being changed from in-person to a telemed-video visit.     MyChart Account set up offered: N/A    Patient does not have MyChart and wishes to have emailed zoom information: N/A    I wanted to make you aware that [facility] cannot control the security of email messages once we send them, we need your permission to email you.     Patients preferred email: N/a    Patients preferred phone number: [phone]        Yes/No    yes Verbal email consent obtained; ok to send email to patient.   yes Verbal Consent was obtained from the patient to complete this telephone and or televideo consult; including the potential for financial liability.   yes Paper verbal consent signed and sent for scanning

## 2019-05-23 ENCOUNTER — Ambulatory Visit: Payer: PRIVATE HEALTH INSURANCE | Admitting: Gastroenterology

## 2019-05-23 DIAGNOSIS — K51918 Ulcerative colitis, unspecified with other complication: Secondary | ICD-10-CM

## 2019-05-23 DIAGNOSIS — K831 Obstruction of bile duct: Secondary | ICD-10-CM

## 2019-05-23 DIAGNOSIS — K8301 Primary sclerosing cholangitis: Secondary | ICD-10-CM

## 2019-05-23 NOTE — H&P (Signed)
Gastroenterology and Hepatology Office Visit: Via televideo due to [place]  [id number]     05/23/2019  Kyle Agee MD  Kyle So MD    Chief Complaint/Reason for Office Visit: [facility]    We had the pleasure of seeing your patient, Kyle Bright, in the gastroenterology/hepatology clinic via [place]. As you know, he is a 43 y.o. 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    Patient has a hx of [place] which was originally diagnosed in January 2005.  He has had prior ERCP's with most recent done by Dr. Foye Spurling with Spyglass revealling some low grade dysplasia but not Cholangiocarcinoma to date.        Allergies/Sensitivities:  Allergies   Allergen Reactions    Adhesive Tape Rash    Sulfa Antibiotics Rash     Rash on legs        Medications:   Prior to Admission medications    Not on File       Past Medical Hx:   Past Medical History:   Diagnosis Date    Anemia     Osteoporosis     Palpitations     PSC (primary sclerosing cholangitis)        Past Surgical Hx:   Past Surgical History:   Procedure Laterality Date    COLONOSCOPY      TOTAL COLECTOMY         Social Hx:   Social History     Tobacco Use    Smoking status: Never Smoker    Smokeless tobacco: Never Used   Substance Use Topics    Alcohol use: Not Currently       Family Hx: History reviewed. No pertinent family history of GI malignancy, IBD, liver disease, pancreatic disease, or celiac disease.    ROS:   General:  No malaise, fatigue, fever, chills, sweats.  HEENT: No hearing loss, visual changes.   Cardiovascular:  No chest pain, palpitations.  Respiratory:  No dyspnea, cough.  Musculoskeletal:  No myalgias.  GI:  See above.  GU:  No dysuria, hematuria.  Skin:  No rash.  Neuro:   No focal numbness, weakness, tremor.  Psychiatric:  No confusion,  depression.  Endocrine:  No heat or cold intolerance.  Heme/Lymph:  No easy bruising/bleeding.  No concerning lumps.  Allergy/Rheum:  No arthralgias/arthritis.  No rash/hives.     Vitals:   There were no vitals filed for this visit.  There is no height or weight on file to calculate BMI.    Physical Exam: visually and per report  General: well appearing, NAD  Skin:  No rashes, jaundice.  Warm and dry.   HEENT:  No icterus.  No oropharyngeal abnormalities.    Extremities:  Warm, no edema.   Neuro:  Alert and oriented x 3 with appropriate affect.  Ambulatory.  No gross motor deficits noted.      GI Procedures:  ERCP 01/10/2019    Duodenal ampulla showed evidence of sphincterotomy. CBD was   cannulated with a balloon catheter and cholangiogram was   performed. Cholangiogram showed mild diffuse narrowing of the   common bile duct. No definite stricture of common hepatic duct   or common bile duct was seen. Strictures were seen in the right   and left hepatic duct and intrahepatic ducts. The intrahepatic  ducts had a beaded appearance.     A spyglass cholangioscope was introduced into the common bile   duct. The common bile duct and the common hepatic duct up to the   bifurcation was carefully examined. No obvious mass was seen.    The lining of the common hepatic duct and common bile duct showed   scarring. Multiple biopsies were taken from the common hepatic   duct and common bile duct. Brushings were obtained from the   common hepatic duct and common bile duct. Spyglass   cholangioscope was withdrawn. CBD was again cannulated with a   balloon catheter and cholangiogram was performed. No   extravasation of contrast was seen. Patient tolerated the   procedure well     Impression:   1. No definite stricture of common bile duct or common hepatic   duct seen. There is mild diffuse narrowing of common bile duct.   2. Strictures of the right hepatic duct, left hepatic duct and   intrahepatic duct seen. The  intrahepatic ducts had beaded   appearance.   3. Cholangioscopy shows diffuse scarring of the common hepatic   duct and common bile duct. No definite mass was seen. Multiple   biopsies taken.     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         Labs/Imaging:   All labs from the past 90 days   Documentation Only on 05/08/2019   Component Date Value    INR 05/05/2019 1.0     Glucose 05/05/2019 109     Sodium 05/05/2019 134     Potassium 05/05/2019 4.1     Chloride 05/05/2019 99     CO2 05/05/2019 26     Anion Gap 05/05/2019 9     UN 05/05/2019 13     Creatinine 05/05/2019 0.80     GFR,Caucasian 05/05/2019 108     GFR,Black 05/05/2019 128     Calcium 05/05/2019 9.9     Total Protein 05/05/2019 7.8     Albumin 05/05/2019 4.5     Bilirubin,Total 05/05/2019 1.5     AST 05/05/2019 207     ALT 05/05/2019 288     Alk Phos 05/05/2019 921      MR abdomen without and with contrast   MR MRCP   Status: Final result     Reading Radiologist:   Layla Maw, MD  Signed By:  Layla Maw, MD on 09/21/2018 11:05 AM      Multifocal mild intrahepatic biliary ductal dilatation compatible with history of PSC, similar to prior imaging.       In addition, there is mild focal stricture involving the proximal common hepatic duct, without upstream biliary ductal dilatation, this could be a benign or malignant stricture.      Stable 3 cm complex cystic lesion in segment 2/3 with large amount of layering material but no enhancement.         Impression(s)/Recommendation(s):     Kyle Bright is a 43 year old male with [place] in setting of [place] s/p colectomy and subsequent reversal with creation of J pouch 2010. He has undergone ERCPs at OSH in the past that have been concerning for cholangiocarcinoma.   ERCP by Dr Foye Spurling on 01/10/19 that showed mild diffuse narrowing of the CBD, and strictures in the right and left  intrahepatic ducts in a beaded appearance.  Spyglass  was performed of the CBD and CHD and there was no obvious mass, no definite stricture of CBD or CHD  there was scarring. Multiple biopsies were taken .     The pathology showed low grade dysplasia with some histologic features concerning for but not diagnostic of high grade dysplasia.  No malignancy.     Given his progressive disease and dysplasia, he would likely be best served with liver replacement and given the low meld, Live Donor Liver Transplantation.    We will proceed with the evaluation and discuss options at that time. He continues to suffer from  Pouchitis and may benefit from seeing our IBD center post transplant if this occurs where we have the added benefit of IBD medical and surgical care in association with the tranplant immunosuppression group    Thank you for allowing [place] to participate in this patient's care.  Please do not hesitate to contact us with any questions or concerns at [phone].    Kyle So, MD   Medical Director: [facility]  [facility]

## 2019-05-24 ENCOUNTER — Encounter: Payer: Self-pay | Admitting: Gastroenterology

## 2019-05-26 ENCOUNTER — Telehealth: Payer: Self-pay

## 2019-05-26 ENCOUNTER — Other Ambulatory Visit: Payer: Self-pay

## 2019-05-26 DIAGNOSIS — K7210 Chronic hepatic failure without coma: Secondary | ICD-10-CM

## 2019-05-26 NOTE — Telephone Encounter (Signed)
Kyle Bright is 43 y.o.male with end stage liver disease related to [facility].     Kyle Bright has been referred to [place] by Lara Mulch.         Social support/issues:Family living in home. Partner will be attending teaching class and SW evaluation with the patient. Employed     Do you drink any alcohol? No    Do you use any drugs not prescribed by MD?  No        Health Maintenance:    Do you have medical insurance? yes  Do you have dental insurance? yes    Have you been to the dentist in the last 6 months?  No       Name of dentist: no  If no- will plan for pt to be seen on AC4 for dental with the evaluation.      Last Pap:  Marland Kitchen   Last Mammogram: .   Last Colonoscopy:  Last year.   Last Endoscopy:  .      Pt instructed to make appointments for any of the above health maintenance items that need completion, and to bring appointment information to their evaluation appointment so we can follow up and obtain results. Appointment information includes date of appointment, and name of doctor the appointment is with. Form will be sent with appointment information for them to fill out and bring back.  N/A      Frailty:    Fatigue: How much of the time during the past 4 weeks did you feel tired? (check one with X next to it)  All of the time  (1pt)  Most of the time (1pt) *  Some of the time (0pts)  A little of the time (0pts)  None of the time (0pts)    Score 1    Resistance: By yourself, and not using aids, do  you have difficulty walking up 10 steps without resting?    No    1=Yes  0=No    Score 0    Ambulation: By yourself , and not using aids, do you have any difficulty walking several hundred yards?   No    1=Yes  0=No    Score 0    Illness:  Did a doctor ever tell you that you have any of the following illnesses?  HTN no  Diabetes no  Cancer (other than a minor skin cancer) no  Chronic lung disease no  Heart attack no  CHF no  Angina no  Asthma no  Arthritis no  Stroke no  Kidney Disease no    0-4=0  5-11=1    Score 0    Loss  of weight:   Current weight 120  Weight one year ago 120  Percent weight change: (weight one year ago - current weight)/weight one year ago -then multiply by 100     0  Percent change >5= 1 (represents a 5% weight loss)   Percent change <5=0    Score 0      Frailty Scale:  0:robust  1-2: pre-frail  3-5 : frail    Score 1  If frail-PT referral with evaluation       Advised patient to have medication list with them at time of evaluation.

## 2019-06-05 ENCOUNTER — Telehealth: Payer: Self-pay

## 2019-06-05 NOTE — Telephone Encounter (Signed)
Patient scheduled for the following liver transplant evaluation appointments.     On 7-20: (to be done via telemed) 9:00 - 1:00 phone calls for teaching, social work, dietician and financial.    7-21: labs @ 12:00, DSE @ 1:00, CT @ 4:50 at [facility]    7-22: GI @ 10;30, surgeon @ 11:30, dental @ 1:30.    Called patient and confirmed appointments. Mailed teaching packet. E-Mailed schedule and teaching link.

## 2019-06-19 NOTE — Progress Notes (Signed)
Error duplicate documentation 

## 2019-06-19 NOTE — Progress Notes (Signed)
Pre-liver transplant teaching class was done with patient and partner, [name].  Information was given verbally and in writing regarding evaluation testing and process, selection committee, waiting on the transplant list, and ability to list in multiple regions.  Pt reported he hadn't received e-mail with power point information, however, he had read the printed information.  Also discussed donor types (including traditional donors, donors with increased risk for transmission of infectious disease, DCD and live donor options), the transplant surgery and recovery, and survival statistics. Reviewed required clinic follow-up, post transplant medications/immunosuppressants, and possible complications related to liver transplantation. Time was allotted for questions. Comprehension:  Asked few questions.  Dental  forms were given to patient to be completed locally--n/a, pt has appt in [facility] 7/22 at 1:30.  Reviewed transplant agreement discussion form with patient. Contact information for the transplant office and their coordinator were provided.      Addendum:  Pre-txp power point class sent to pt via e-mail at [email or web address].

## 2019-06-19 NOTE — Progress Notes (Signed)
Transplant Psychosocial Assessment     Due to the current COVID-19 pandemic and guidelines for social distancing,   writer contacted patient via phone to obtain the information noted below.    Communication style/ Mental status: alert and oriented x 3 and pleasant and cooperative   Primary language: English Interpreter required?  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  Transplant Social Worker contacted patient and partner Kyle Bright via phone on today's date to complete transplant psychosocial evaluation.  Writer identified self and role with the transplant team, answered questions related to transplant evaluation/listing process.  Writer advised contents of social work evaluation will be used in determining if psychosocial criteria met for transplant consideration.  Of note, Kyle Bright interested in live donor work-up.      Patient lives in [place], approximately 1.5 hours away from [facility].  He rents a 7th floor apartment, where he lives with 42yo partner Kyle Bright.  The couple have been together for over 20 years.  Patient has no offspring.  His mother lives in [place], while father is located in [place].  He has twin older brothers, who lives in [place] and [place].  He also has three younger half-siblings.  Kyle Bright's family lives in [place], with a sibling in [place].  Patient currently working on his PhD at [employer], needs to take qualifying exam and complete dissertation for graduation.  He is unsure if he has access to medical leave, but believes graduate program would be flexible with his medical needs if required.  He is independent with is ADLs, continues to drive.   Partner Kyle Bright works as a temp at [employer].  Prior to the pandemic, patient enjoyed exercising and staying active.      Social worker educated patient on transplant process including: medication regimen and the necessity of ongoing immunosuppression therapy, the importance of adherence, post-operative follow-up care as outpatient, social support requirements, available resources, and financial considerations.  Both patient and support aware and agreeable to rigors of pre- and post- transplant care.  Patient appears to have an adequate support system for post-transplant care with his partner Kyle Bright (believes access to PFL or work remotely), his mother 76yo Kyle Bright ([place], drives, retired), and Kyle Bright's mother Kyle Bright ([place], drives) identified as contacts.      Patient does not have a substance use history. Writer educated patient on transplant program policy of total abstinence from drugs and alcohol in both the pre- and post- setting.  SW reviewed random tox screen policy with 8 hour window for completion.  Kyle Bright admits to feelings of anxiety and depression at times due to his health status, nothing that has been officially diagnosed.  He was engaging in group counseling through [employer] as part of his graduate program.  He denies any mental health issues/concerns.  He reports good adherence with medical treatment, attending appointments as scheduled, taking medications as prescribed, and completing necessary labwork in timely fashion.  Insurance intact with [employer] via graduate school enrollment.      Patient currently meets psychosocial criteria for transplant listing.       Please see Doc Flowsheet pasted below for   below for further detail.      Plan:   Social worker to provide supportive services as needed.  Patients psychosocial evaluation will be presented to the Transplant Selection Committee, when/where a formal team decision will be made regarding the patients overall  transplant candidacy.     Social Worker: Kyle Bright, LMSW      06/19/19 0900   TRANSPLANT TYPE   Organ Type Liver   CONTACT(S)   Name Kyle Bright   Number [phone]   Relationship partner   Additional Contact? N   DEMOGRAPHICS   Religious/Cultural Factors None   [place] of Residence Other (specify in Comments)  Kyle Bright)   Marital Status Not married  (partner x 20 years)   [place] Citizen Y   Ethnicity/Race White   White White:  Not Specified/Unknown   EDUCATION/TEACHING NEEDS   Education level Other (specify in Comments)  (Masters in [employer])   Learning accomodations Visual impairment;Other language (specify in Comments)  (glasses for driving)   INCOME/INSURANCE   Vocational Other (specify in Comments)  (PhD - [employer], [employer])   Employment-Spouse/Significant Other Full-time;Other (specify in Comments)  (temp at [employer], working remotely)   Income situation [employer] information Other (specify in Comments);[employer]  (through [employer])   Prescription Coverage Y   Prescription Coverage Type 7 different medications, manage on own, uses a pillbox   Served in [place] military N   HOUSING   Type of home [place] Steps to enter the home;Bedroom on first floor;Bathroom on first floor;Utilities working;Other (specify in Comments)  (Rent apt, Kyle Bright (1.5hrs away), elevator - 7th floor)   Lives with Significant other   Family structure Significant other;Siblings;Parents   ACTIVITIES OF DAILY LIVING   Transfers Independent   Assistive device none   Ambulation Independent   Bathing/Grooming Independent   Nutrition Independent   Household management Independent   Fatigue Yes;Other (specify in Comments)  ("years")   Does patient currently have home care services? N   Recreation/Hobbies Other (specify in Comments)  (fencing, running, exercise)   SUBSTANCE HISTORY   Substance abuse history Yes   History of Tobacco Use N   History of Alcohol Use N   History of Drug Use N    Has patient been prescribed pain medications? N   Concerns about prescription drugs misuse, abuse? N   Substance abuse impact Y   If yes, impact on employment? No   If yes, legal impacts (any DUIs)? N   If yes, impact on relationships? N   Chemical Dependency Rehabilitation Programs History N   Family History of Alcoholism? Y   If yes, relationship of individual to patient maternal grandather, maternal aunt   Substance abuse progress APPLICABLE   Urine screen Y;Other (specify in Comments)  (06/20/19 - neg )   [place] Issues (Patient-Reported) Depression;Anxiety   History of mental health counseling Yes;Other (specify in Comments)  (last school year - group counseling through [employer])   Medications No   Medications prescribed by N/A   Mental health inpatient stays No   Suicide attempts No   Self harm No   Domestic violence No   History of abuse/neglect No   Firearms in the home Yes   DIALYSIS HISTORY   Dialysis N   COPING/COMPLIANCE   Coping Appropriately concerned;Interested in pursuing transplant;Adjusting well   Compliance Adherent to current medical care;Willing to commit to post transplant care regimen including: need for regular  ongoing clinic appointment and importance of medication adherence   SUPPORT/TRANSPORTATION   Family or support system availability Appears to have an adequate support system for post transplant care;With patient through process, emotionally supportive   Transportation for post transplant care Family/friend   RISK SUMMARY   Patient appears to have a good understanding of the risks and benefits associated with transplant Yes   Overall risk Low risk   Needs further evaluation No   Barriers to transplant None at this time   Additional Comments HCP to be brought in, POA completed   REFERRALS   Referrals made None at this time   Time Spent   Time Spent with Patient (min) 60

## 2019-06-20 ENCOUNTER — Ambulatory Visit
Admission: RE | Admit: 2019-06-20 | Discharge: 2019-06-20 | Disposition: A | Discharge status: Home / Self Care | Payer: PRIVATE HEALTH INSURANCE | Source: Ambulatory Visit

## 2019-06-20 ENCOUNTER — Other Ambulatory Visit
Admission: RE | Admit: 2019-06-20 | Discharge: 2019-06-20 | Disposition: A | Discharge status: Home / Self Care | Payer: PRIVATE HEALTH INSURANCE | Source: Ambulatory Visit | Attending: Transplant | Admitting: Transplant

## 2019-06-20 ENCOUNTER — Ambulatory Visit
Admission: RE | Admit: 2019-06-20 | Discharge: 2019-06-20 | Disposition: A | Discharge status: Home / Self Care | Payer: PRIVATE HEALTH INSURANCE | Source: Ambulatory Visit | Attending: Transplant | Admitting: Transplant

## 2019-06-20 DIAGNOSIS — K7210 Chronic hepatic failure without coma: Secondary | ICD-10-CM

## 2019-06-20 DIAGNOSIS — K7290 Hepatic failure, unspecified without coma: Secondary | ICD-10-CM

## 2019-06-20 DIAGNOSIS — R918 Other nonspecific abnormal finding of lung field: Secondary | ICD-10-CM

## 2019-06-20 DIAGNOSIS — Z006 Encounter for examination for normal comparison and control in clinical research program: Secondary | ICD-10-CM

## 2019-06-20 DIAGNOSIS — K8301 Primary sclerosing cholangitis: Secondary | ICD-10-CM

## 2019-06-20 LAB — CBC
Hematocrit: 50 % (ref 40–51)
Hematocrit: 51 % (ref 40–51)
Hemoglobin: 16.8 g/dL (ref 13.7–17.5)
Hemoglobin: 17.3 g/dL (ref 13.7–17.5)
MCH: 30 pg/cell (ref 26–32)
MCH: 30 pg/cell (ref 26–32)
MCHC: 33 g/dL (ref 32–37)
MCHC: 34 g/dL (ref 32–37)
MCV: 89 fL (ref 79–92)
MCV: 91 fL (ref 79–92)
Platelets: 246 10*3/uL (ref 150–330)
Platelets: 290 10*3/uL (ref 150–330)
RBC: 5.6 MIL/uL (ref 4.6–6.1)
RBC: 5.7 MIL/uL (ref 4.6–6.1)
RDW: 12.5 % (ref 11.6–14.4)
RDW: 12.5 % (ref 11.6–14.4)
WBC: 5.4 10*3/uL (ref 4.2–9.1)
WBC: 5.6 10*3/uL (ref 4.2–9.1)

## 2019-06-20 LAB — DOBUTAMINE STRESS ECHO COMPLETE
Aortic Diameter (sinus of Valsalva): 3 cm
BMI: 17.8 kg/m2
BP Diastolic: 69 mmHg
BP Systolic: 104 mmHg
BSA: 1.63 m2
Deceleration Time - MV: 311 ms
E/A ratio: 1.37
ECG PR interval: 160 ms
ECG QRS interval: 90 ms
Heart Rate During Imaging: 155 {beats}/min
Heart Rate: 67 {beats}/min
Height: 69 in
LA Diameter BSA Index: 1.7 cm/m2
LA Diameter Height Index: 1.5 cm/m
LA Diameter: 2.7 cm
LA Systolic Vol BSA Index: 4.7 mL/m2
LA Systolic Vol Height Index: 4.3 mL/m
LA Systolic Volume: 7.6 mL
LV ASE Mass BSA Index: 46.1 gm/m2
LV ASE Mass Height 2.7 Index: 16.5 gm/m2.7
LV ASE Mass Height Index: 42.9 gm/m
LV ASE Mass: 75.1 gm
LV CO BSA Index: 1.87 L/min/m2
LV Cardiac Output: 3.05 L/min
LV Diastolic Volume Index: 40.7 mL/m2
LV Posterior Wall Thickness: 0.7 cm
LV SV - LVOT SV Diff: 2.81 mL
LV SV BSA Index: 27.9 mL/m2
LV SV Height Index: 26 mL/m
LV Septal Thickness: 0.7 cm
LV Stroke Volume: 45.5 mL
LV Systolic Volume Index: 12.8 mL/m2
LVED Diameter BSA Index: 2.4 cm/m2
LVED Diameter Height Index: 2.2 cm/m
LVED Diameter: 3.9 cm
LVED Volume BSA Index: 40.7 mL/m2
LVED Volume BSA Index: 41 ml/m2
LVED Volume Height Index: 37.9 mL/m
LVED Volume: 66.4 mL
LVEF (Volume): 69 %
LVES Volume BSA Index: 12.8 mL/m2
LVES Volume BSA Index: 13 ml/m2
LVES Volume Height Index: 11.9 mL/m
LVES Volume: 20.9 mL
LVOT Area (calculated): 3.53 cm2
LVOT Cardiac Index: 1.75 L/min/m2
LVOT Cardiac Output: 2.86 L/min
LVOT Diameter: 2.12 cm
LVOT PWD VTI: 12.1 cm
LVOT PWD Velocity (mean): 46.7 cm/s
LVOT PWD Velocity (peak): 75.1 cm/s
LVOT SV BSA Index: 26.19 mL/m2
LVOT SV Height Index: 24.4 mL/m
LVOT Stroke Rate (mean): 164.8 mL/s
LVOT Stroke Rate (peak): 265 mL/s
LVOT Stroke Volume: 42.69 cc
MPHR: 178 {beats}/min
MR Regurgitant Fraction (LV SV Mtd): 0.06
MR Regurgitant Volume (LV SV Mtd): 2.8 mL
MV Peak A Velocity: 44.4 cm/s
MV Peak E Velocity: 60.7 cm/s
Mitral Annular E/Ea Vel Ratio: 4.27
Mitral Annular Ea Velocity: 14.2 cm/s
Peak DBP: 67 mmHg
Peak HR: 160 {beats}/min
Peak SBP: 110 mmHg
Percent MPHR: 89.9 %
RPP: 17600 BPM x mmHG
RR Interval: 895.52 ms
Stress Atropine Total Dose: 0.5 mg
Stress Dobutamine Total Dose: 40 mcg/kg/min
Stress LV CO BSA Index: 3.97 L/min/m2
Stress LV Cardiac Index Change: 2.1 L/min/m2
Stress LV Cardiac Output Change: 3.4 L/min
Stress LV Cardiac Output: 6.48 L/min
Stress LV SV BSA Index: 25.6 mL/m2
Stress LV SV Height Index: 23.9 mL/m
Stress LV Stroke Volume Change: -3.7 mL
Stress LV Stroke Volume: 41.8 mL
Stress LVED Volume BSA Index: 31.7 mL/m2
Stress LVED Volume BSA Index: 32 ml/m2
Stress LVED Volume Change: -14.7 mL
Stress LVED Volume Height Index: 29.5 mL/m
Stress LVED Volume: 51.7 mL
Stress LVEF (Volume) Change: 12.3 %
Stress LVEF (Volume): 80.9 %
Stress LVES Volume BSA Index: 6 ml/m2
Stress LVES Volume BSA Index: 6.1 mL/m2
Stress LVES Volume Change: -11 mL
Stress LVES Volume Height Index: 5.6 mL/m
Stress LVES Volume: 9.9 mL
Stress Peak Stage: 4.34
Stress duration (min): 13 min
Stress duration (sec): 2 s
Weight (lbs): 120 [lb_av]
Weight: 1920 oz

## 2019-06-20 LAB — COMPREHENSIVE METABOLIC PANEL
ALT: 246 U/L — ABNORMAL HIGH (ref 0–50)
ALT: 252 U/L — ABNORMAL HIGH (ref 0–50)
AST: 170 U/L — ABNORMAL HIGH (ref 0–50)
AST: 184 U/L — ABNORMAL HIGH (ref 0–50)
Albumin: 4.4 g/dL (ref 3.5–5.2)
Albumin: 4.5 g/dL (ref 3.5–5.2)
Alk Phos: 888 U/L — ABNORMAL HIGH (ref 40–130)
Alk Phos: 912 U/L — ABNORMAL HIGH (ref 40–130)
Anion Gap: 12 (ref 7–16)
Anion Gap: 12 (ref 7–16)
Bilirubin,Total: 1.4 mg/dL — ABNORMAL HIGH (ref 0.0–1.2)
Bilirubin,Total: 1.4 mg/dL — ABNORMAL HIGH (ref 0.0–1.2)
CO2: 27 mmol/L (ref 20–28)
CO2: 27 mmol/L (ref 20–28)
Calcium: 10.3 mg/dL (ref 9.0–10.3)
Calcium: 10.4 mg/dL — ABNORMAL HIGH (ref 9.0–10.3)
Chloride: 97 mmol/L (ref 96–108)
Chloride: 98 mmol/L (ref 96–108)
Creatinine: 0.8 mg/dL (ref 0.67–1.17)
Creatinine: 0.81 mg/dL (ref 0.67–1.17)
GFR,Black: 126 *
GFR,Black: 127 *
GFR,Caucasian: 109 *
GFR,Caucasian: 110 *
Glucose: 67 mg/dL (ref 60–99)
Glucose: 79 mg/dL (ref 60–99)
Lab: 16 mg/dL (ref 6–20)
Lab: 16 mg/dL (ref 6–20)
Potassium: 5 mmol/L (ref 3.3–5.1)
Potassium: 5 mmol/L (ref 3.3–5.1)
Sodium: 136 mmol/L (ref 133–145)
Sodium: 137 mmol/L (ref 133–145)
Total Protein: 6.9 g/dL (ref 6.3–7.7)
Total Protein: 8.1 g/dL — ABNORMAL HIGH (ref 6.3–7.7)

## 2019-06-20 LAB — CEA
CEA: 3 ng/mL (ref 0.0–4.7)
CEA: 3.1 ng/mL (ref 0.0–4.7)

## 2019-06-20 LAB — CERULOPLASMIN: Ceruloplasmin: 37 mg/dL — ABNORMAL HIGH (ref 15–30)

## 2019-06-20 LAB — AFP TUMOR MARKER
AFP (eff. 4-2010): 3 IU/mL (ref 0–7)
AFP (eff. 4-2010): 3 IU/mL (ref 0–7)

## 2019-06-20 LAB — URINALYSIS WITH REFLEX TO MICROSCOPIC
Blood,UA: NEGATIVE
Ketones, UA: NEGATIVE
Leuk Esterase,UA: NEGATIVE
Nitrite,UA: NEGATIVE
Protein,UA: 30 mg/dL — AB
Specific Gravity,UA: 1.021 (ref 1.002–1.030)
pH,UA: 6 (ref 5.0–8.0)

## 2019-06-20 LAB — TIBC
Iron: 203 ug/dL — ABNORMAL HIGH (ref 45–170)
TIBC: 392 ug/dL (ref 250–450)
Transferrin Saturation: 52 % (ref 20–55)

## 2019-06-20 LAB — ALPHA-1-ANTITRYPSIN: A-1 Antitrypsin: 174 mg/dL (ref 90–200)

## 2019-06-20 LAB — URINE MICROSCOPIC (IQ200)
RBC,UA: 1 /hpf (ref 0–2)
WBC,UA: 1 /hpf (ref 0–5)

## 2019-06-20 LAB — PROTIME-INR
INR: 0.9 (ref 0.9–1.1)
INR: 0.9 (ref 0.9–1.1)
Protime: 10.7 s (ref 10.0–12.9)
Protime: 10.7 s (ref 10.0–12.9)

## 2019-06-20 LAB — PSA (EFF.4-2010): PSA (eff. 4-2010): 0.18 ng/mL (ref 0.00–4.00)

## 2019-06-20 LAB — CA 19 9 (EFF. 01-2011)
CA 19 9 (eff. 01-2011): 137 U/mL — ABNORMAL HIGH (ref 0–35)
CA 19 9 (eff. 01-2011): 134 U/mL — ABNORMAL HIGH (ref 0–35)

## 2019-06-20 LAB — ETHANOL, URINE: Ethanol,UR: NEGATIVE

## 2019-06-20 LAB — FERRITIN: Ferritin: 121 ng/mL (ref 20–250)

## 2019-06-20 LAB — DRUG SCREEN CHEMICAL DEPENDENCY, URINE
Amphetamine,UR: NEGATIVE
Benzodiazepinen,UR: NEGATIVE
Cocaine/Metab,UR: NEGATIVE
Opiates,UR: NEGATIVE
THC Metabolite,UR: NEGATIVE

## 2019-06-20 LAB — GGT: GGT: 592 U/L — ABNORMAL HIGH (ref 8–61)

## 2019-06-20 LAB — ABO/RH: ABO RH Blood Type: O POS

## 2019-06-20 LAB — BILIRUBIN, DIRECT: Bilirubin,Direct: 0.9 mg/dL — ABNORMAL HIGH (ref 0.0–0.3)

## 2019-06-20 LAB — FIBRINOGEN: Fibrinogen: 427 mg/dL — ABNORMAL HIGH (ref 172–409)

## 2019-06-20 LAB — APTT: aPTT: 40.8 s — ABNORMAL HIGH (ref 25.8–37.9)

## 2019-06-20 MED ORDER — ATROPINE SULFATE 0.1 MG/ML IJ SOLN *I*
0.5000 mg | INTRAMUSCULAR | Status: DC | PRN
Start: 2019-06-20 — End: 2019-06-20
  Administered 2019-06-20: 0.5 mg via INTRAVENOUS

## 2019-06-20 MED ORDER — DOBUTAMINE 4 MG/ML IN D5W IV SOLN (ADULT STANDARD) *I*
5.0000 ug/kg/min | INTRAVENOUS | Status: DC
Start: 2019-06-20 — End: 2019-06-21
  Administered 2019-06-20: 5 ug/kg/min via INTRAVENOUS

## 2019-06-20 MED ORDER — STERILE WATER FOR IRRIGATION IR SOLN *I*
900.0000 mL | Freq: Once | Status: AC
Start: 2019-06-20 — End: 2019-06-20
  Administered 2019-06-20: 900 mL via ORAL

## 2019-06-20 MED ORDER — PERFLUTREN PROTEIN A MICROSPH (OPTISON) IV SUSP *I*
1.5000 mL | INTRAVENOUS | Status: AC | PRN
Start: 2019-06-20 — End: 2019-06-20
  Administered 2019-06-20 (×2): 1.5 mL via INTRAVENOUS

## 2019-06-20 MED ORDER — IOHEXOL 350 MG/ML (OMNIPAQUE) IV SOLN *I*
1.0000 mL | Freq: Once | INTRAVENOUS | Status: AC
Start: 2019-06-20 — End: 2019-06-20
  Administered 2019-06-20: 75 mL via INTRAVENOUS

## 2019-06-20 MED FILL — Perflutren Protein-Type A Microspheres IV Susp: INTRAVENOUS | Qty: 3 | Status: AC

## 2019-06-20 NOTE — Progress Notes (Signed)
Potential Liver Transplant Recipient Nutrition Evaluation    ATTRIBUTES   Anthropometrics   Height Ht Readings from Last 1 Encounters:   06/20/19 1.753 m (5' 9")      Weight Wt Readings from Last 1 Encounters:   06/20/19 54.4 kg (120 lb)      EDW 54.4 kg   IBW 72.7 kg + 10%   BMI on EDW 17.72kg/m^2   Pre-illness Weight 54.4 kg patient reports wt stable +/- 3# for past 3 years      Weight loss based on estimated dry weight:   no  [] >7.5% in 3 months                                           [] >10% in 6 months  [] >20% in one year         Recent labs reviewed    [x] Yes   [] NoResults for Kyle, Bright (MRN [id number]) as of 06/20/2019 14:45   Ref. Range 06/20/2019 12:35   Sodium Latest Ref Range: 133 - 145 mmol/L 136   Potassium Latest Ref Range: 3.3 - 5.1 mmol/L 5.0   Chloride Latest Ref Range: 96 - 108 mmol/L 97   CO2 Latest Ref Range: 20 - 28 mmol/L 27   Anion Gap Latest Ref Range: 7 - 16  12   UN Latest Ref Range: 6 - 20 mg/dL 16   Creatinine Latest Ref Range: 0.67 - 1.17 mg/dL 0.81   GFR,Black Latest Units: * 126   GFR,Caucasian Latest Units: * 109   Glucose Latest Ref Range: 60 - 99 mg/dL 67   Calcium Latest Ref Range: 9.0 - 10.3 mg/dL 10.4 (H)   Total Protein Latest Ref Range: 6.3 - 7.7 g/dL 6.9   Albumin Latest Ref Range: 3.5 - 5.2 g/dL 4.5   ALT Latest Ref Range: 0 - 50 U/L 252 (H)   AST Latest Ref Range: 0 - 50 U/L 184 (H)   Alk Phos Latest Ref Range: 40 - 130 U/L 912 (H)   Bilirubin,Total Latest Ref Range: 0.0 - 1.2 mg/dL 1.4 (H)   Protime Latest Ref Range: 10.0 - 12.9 sec 10.7   INR Latest Ref Range: 0.9 - 1.1  0.9   WBC Latest Ref Range: 4.2 - 9.1 THOU/uL 5.4   RBC Latest Ref Range: 4.6 - 6.1 MIL/uL 5.6   Hemoglobin Latest Ref Range: 13.7 - 17.5 g/dL 16.8   Hematocrit Latest Ref Range: 40 - 51 % 50   MCV Latest Ref Range: 79 - 92 fL 91   MCH Latest Ref Range: 26 - 32 pg/cell 30   MCHC Latest Ref Range: 32 - 37 g/dL 33   RDW Latest Ref Range: 11.6 - 14.4 % 12.5   Platelets Latest Ref Range: 150 - 330 THOU/uL  246          Current Medications Current Outpatient Medications   Medication Sig    amoxicillin-clavulanate (AUGMENTIN) 500-125 MG per tablet Take 1 tablet by mouth 2 times daily    mesalamine (LIALDA) 1.2 GM EC tablet Take 1.2 g by mouth daily (with breakfast)     loperamide (IMODIUM) 2 MG capsule Take 2 mg by mouth    multi-vitamin (TAB-A-VITE/BETA CAROTENE) per tablet Take 1 tablet by mouth every morning     calcium-vitamin D (OSCAL-500) 500-200 MG-UNIT per tablet Take 1 tablet by mouth  every morning     cholecalciferol (VITAMIN D) 50 MCG (2000 UT) tablet Take 2,000 Units by mouth every morning     Probiotic Product (VSL#3) CAPS Take 1 capsule by mouth every morning      No current facility-administered medications for this visit.      Facility-Administered Medications Ordered in Other Visits   Medication    DOBUTamine continuous infusion 4000 mcg/ml (Adult Standard)    atropine 0.1 mg/mL (1 mg/10 mL) injection 0.5 mg        Diagnosis/Pt Summary       Pertinent Medical History Past Medical History:   Diagnosis Date    Anemia     Osteoporosis     Palpitations     PSC (primary sclerosing cholangitis)        Past Surgical History:   Procedure Laterality Date    COLONOSCOPY      TOTAL COLECTOMY        Diet History   Current Regimen No diet tries to add calories   Diet education/printed materials [x] Yes   [] No  Will email low sodium high protein /calories   Provided by: Kyle Bright   Comments: Diet history: 3 meals/d is norm  B--potatoes, 2 eggs, fruit, coffee  L--rice/chicken, veget/pasta/legumes  D--same as lunch  Dairy--Greek yogurt, milk w/coffee, occas. Glass of milk, cheese  Proteins--fish 1x/wk, legumes, chicken, clif protein bar  Supplements--ensure, protein powder used in past , shake ([place] yogurt/fruit/fiber supplement--uses benefiber type)  Fruits/veget--tolerates low gas mostly but includes celery, carrots, peas, peppers, zucchini, squash, arugula, occas. Asparagus and broccoli (takes gas X  sometimes)  Grains/starch--rice, pasta, quinoa, cereal, granola  Fluids--water, coffee, milk (has high fluid losses)   this is a challenge for pt--Usually 4-8 oz serving fluid as a minimum, average is 6- 8 oz. Fluid/d  (est. Fluid needs 1630-1983m/d)  Likes-broth based soups, butter, syrup, chips, M&Ms, unsalted nuts   Appetite Decreased: [] Yes   [x] No   Just 1-2x/mo feel appetite is less  Needs to time eating food and time sleeping, needs to be done eating 4-5 hours before going to bed so hs snack will be difficult  Patient reports due to small intestine, lack of large intestine   Adherence: [x] Good   [] Fair  [] Poor [] N/A     Estimated Nutritional Needs (based on 54.4g) actual wt   kcal 1630-1900 kcal/day (30-35 kcal/kg)   g protein 65-82 g protein/day (1.2-1.5g protein/kg)   mL fluid 1630-1900 mL/day (30-35 mL/kg)     Nutrition/Medical History   Swallowing difficulties/varices/GIB  [] Yes     [x] No   Mouth sores, ulcers, bleeding gums [x] Yes     [] No some bleeding   Symptoms of micronutrient deficiencies  [] Yes     [] No unable to assess on phone interview   Vitamins/Minerals [x]  Yes    [] No regular MVI, calcium/D, extra Dvitamin, benefiber  (refer to med list above)   Herbal remedies [] Yes     [x] No used to take 5 years ago but not now, takes a probiotic for GI issues   N/V [] Yes     [x] No   Early Satiety [] Yes     [] No   Ascities [x] None   [] Mild   [] Moderate   [] Severe   Edema [x] None   []1+  [] 2+   [] 3+or >   Encephalopathy []  Yes     [x] No   Food allergies/intolerances [] Yes     [x] No  Possible reaction to cinnamon   Living situation Lives with partner Kyle Bright       Skin   Sores, ulcers [] Yes     [x] No   Jaundice [] Yes     [x] No       Bowel Activity   Diarrhea [x] Yes     [] No related to bowel / intestinal issues   Steatorrhea [] Yes     [x] No   Constipation [] Yes     [x] No       Activity           Independent with activities [x] Yes   [] No   Increase in fatigue [x] Yes    [] No frequently   Occasional difficulty w/ baseline activities [] Yes   [] No   Sedentary [x] Yes   [] No   Low Active [x] Yes   [] No   Moderate-High Active [] Yes   [x] No   Exercise Regimen Used to run, Web designer, 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       Functional Capacity   Exhaustion PROMIS Fatigue Questionnaire:percentile  <20% population mean: N/A     Weakness Grip strength 3 trials, dominant hand  Trial 1:      kg  Trial 2:  kg  Trial 3:  kg  Average:  kg  Fails criteria: N/A     Slowness Time a 15 foot walk twice at his or her usual pace;   Trial 1:  seconds  Trial 2:  seconds  Average:  seconds                                             Fails criteria: N/A     Physical Function PROMIS Physical Function Questionnaire:  percentile  <20% population mean: N/A     Physical   Muscle Wasting Moderate   Status of subcutaneous fat Fair stores     Assessment/Plan Malnutrition Status: Pt with evidence of moderate malnutrition related to current condition as evidenced by loss of muscle mass,loss of subcutaneous fat stores, BMI 17.72       Diet Education   Completed   [x] Yes   [] No      Printed nutrition education materials given and explained in detail, which includes:  Low sodium diet, high calorie, high protein diet and encouraged HS snack with protein is possible   Low sodium High protein high calorie ntrition education emailed to patient  Encourage fluid intake as tolerated to goal 1360-1953m/d    RD name, # given with encouragement to contact prn with questions or issues that may arise.       Able to demonstrate knowledge   [x] Yes   [] No verbal       FRAILTY SCORE:  # of yes responses:  0: Not Frail                                        1-2: Pre-frail  3+ : Frail                                                                                 Clinical Risk Assessment for Osteoporosis     - Family history of osteoporosis: Yes    - Parent with history of hip fracture:  No    - Personal history of fragility fracture (fracture resulting from minimal or no trauma, such as a fall from standing height):  No    - Chronic glucocorticoid use (within the past 12 months for at least 90 consecutive days): No    - Current smoking: No    - Excessive alcohol use (men >2 drinks/day, women >1 drinks/day): No    - Diagnosis of Rheumatoid Arthritis: No    - Hypogonadism or premature menopause (< age 25): No     - Type I Diabetes: No    - Untreated hyperthyroidism: No    - Diagnosis of Osteogenesis Imperfecta: No    - Patient at risk for malabsorption: Yes    - Assessment of patient's nutritional status: at-risk for malnutrition--meets criteria for moderate malnutrition based on BMI, loss of muscle/fat stores but appetite is good and meets est. Energy needs    - Is patient taking any nutritional supplements? Including multi-vitamin, calcium and vitamin D.  Yes     - If not currently, has patient previously taken any supplements? Yes    - How often and for what duration does the patient participate in weight-bearing exercise? Some running                                                                        Information   Assessment completed by: Kyle Spanish MS, RDN, CDN, CSR, FNKF

## 2019-06-20 NOTE — Telephone Encounter (Signed)
Error

## 2019-06-21 ENCOUNTER — Encounter: Payer: Self-pay | Admitting: Student in an Organized Health Care Education/Training Program

## 2019-06-21 ENCOUNTER — Encounter: Payer: Self-pay | Admitting: Transplant Surgery Liver and Kidney

## 2019-06-21 ENCOUNTER — Ambulatory Visit: Payer: PRIVATE HEALTH INSURANCE | Admitting: Gastroenterology

## 2019-06-21 ENCOUNTER — Ambulatory Visit: Payer: PRIVATE HEALTH INSURANCE | Admitting: Transplant Surgery Liver and Kidney

## 2019-06-21 ENCOUNTER — Ambulatory Visit: Payer: PRIVATE HEALTH INSURANCE | Admitting: Student in an Organized Health Care Education/Training Program

## 2019-06-21 ENCOUNTER — Encounter: Payer: Self-pay | Admitting: Gastroenterology

## 2019-06-21 VITALS — BP 104/70 | HR 86 | Temp 98.2°F | Resp 12 | Ht 69.0 in | Wt 118.0 lb

## 2019-06-21 DIAGNOSIS — K831 Obstruction of bile duct: Secondary | ICD-10-CM

## 2019-06-21 DIAGNOSIS — K029 Dental caries, unspecified: Secondary | ICD-10-CM

## 2019-06-21 DIAGNOSIS — K8301 Primary sclerosing cholangitis: Secondary | ICD-10-CM

## 2019-06-21 LAB — LIVER TX RECIPIENT WORKUP PANEL
CMV IgG: POSITIVE
EBV IgG: POSITIVE
HBV Core Ab: NEGATIVE
HBV S Ab Quant: 2.84 m[IU]/mL
HBV S Ab: NEGATIVE
HBV S Ag: NEGATIVE
HIV 1&2 ANTIGEN/ANTIBODY: NONREACTIVE
HSV 1 IgG: <0.2 AI
HSV 2 IgG: <0.2 AI
Hep C Ab: NEGATIVE
Hepatitis A IGG: NEGATIVE
Measles IgG: POSITIVE
Mumps IgG: POSITIVE
Rubella IgG AB: POSITIVE
Syphilis Screen: NEGATIVE
Syphilis Status: NONREACTIVE
Toxoplasma IgG: 9 IU/mL
VZV IgG: POSITIVE

## 2019-06-21 LAB — BB PROTOCOL REVIEW

## 2019-06-21 LAB — ANTINUCLEAR ANTIBODY SCREEN: ANA Screen: NEGATIVE

## 2019-06-21 LAB — BLOOD BANK TRANSFUSION

## 2019-06-21 NOTE — Progress Notes (Addendum)
[facility] Liver Transplant Hepatology Evaluation       Referring Physician: Caroline More, MD  Primary Gastroenterologist: Kyle Plowman, MD  Advanced Endoscopist: Kyle Patch, MD    We had the pleasure of seeing  Kyle Bright in pre-liver transplantation clinic as a new patient referral. As you know, he is a 43 y.o. 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    - Recent Hospitalizations: last was in 11/2017 was for bleeding after flexible sigmoidoscopy, never admitted with cholangitis   - Pertinent past medical history includes: Ulcerative colitis    - Pertinent past surgical history includes: total colectomy with J-pouch anastomosis  - Potential Living Donors: Kyle Bright (partner)    History of Present Illness:  Patient was diagnosed with Ulcerative colitis around 2004 and subsequently required a total colectomy with J pouch anastomosis. In 2005 he was diagnosed with [facility] and has largely followed in [place] for GI management.     In early 2019 patient had been having fatigue with myalgias and elevated liver enzymes. Subsequently he underwent an ERCP in 11/2017 which showed multiple strictures in the left and right hepatic ducts. Brushing were consistent with atypical cells felt to be reactive in etiology. Repeat ERCP with Spyglass was done on 06/2018, brushing were read as concerning for malignancy (although path reviewed at [facility] and felt to be reactive). He was referred to [facility] surgery and repeat MRCP was obtained. This showed the presence of a new CHD stricture and thus patient underwent an additional ERCP with Dr. Foye Bright on 01/09/19. Cholangiogram showed mild diffuse narrowing of the CBD without definitive CBD of CHD stricture although strictures were present in the right and left hepatic duct and intrahepatic were beaded in appearance. Spyglass was preformed and the lining of the CHD and CBD showed scarring, multiple biopsies from CBD and  CHD were obtained as well as brushings. CBD biopsy showed low grade dysplasia with histological features concerning for, but not unequivocally diagnostic of high grade dysplasia. Brushing were consistent with reactive etiology. Given this fact patient was referred for evaluation for liver transplant.     Overall patient has been having issues with recurrent pouchitis which has been managed by his primary GI. He has diarrhea about 10-12 times per day on bad day and 6-8 times per day on good day. In addition to this he has been struggling with fatigue which he cannot tell if this is related to diarrhea vs underlying PSC. He denies ever having hospital admission for cholangitis. 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 Main support is his partner Kyle Bright. His weight has been relatively stable, around 120 lbs since time of his colectomy and remains functional.       Past Medical History:  Past Medical History:   Diagnosis Date    Anemia     Liver disease     Osteoporosis     Palpitations     PSC (primary sclerosing cholangitis)         Past Surgical History:   Past Surgical History:   Procedure Laterality Date    COLONOSCOPY      TOTAL COLECTOMY         Social History:   Social History     Tobacco Use    Smoking status: Never Smoker    Smokeless tobacco: Never Used   Substance Use Topics    Alcohol use: Not Currently  Family History:   family history includes Crohn's disease in his brother; Ulcerative colitis in his brother.    Review of Systems   Constitutional: Positive for malaise/fatigue and weight loss. Negative for chills and fever.   HENT: Negative for congestion, nosebleeds and sore throat.    Eyes: Negative for photophobia, pain and redness.   Respiratory: Negative for cough, hemoptysis and shortness of breath.    Cardiovascular: Negative for chest pain, orthopnea and leg swelling.   Gastrointestinal: Positive for diarrhea. Negative for nausea and vomiting.    Genitourinary: Negative for dysuria, flank pain and hematuria.   Musculoskeletal: Negative for falls, joint pain and myalgias.   Skin: Negative for itching and rash.   Neurological: Negative for dizziness, loss of consciousness and headaches.   Endo/Heme/Allergies: Does not bruise/bleed easily.       Vitals:   Vitals:    06/21/19 0831   Resp: 12   Weight: 53.5 kg (118 lb)   Height: 175.3 cm (5' 9")     Body mass index is 17.43 kg/m².    Physical Exam:  General appearance: alert, appears stated age and cooperative. Very thin appearing   Eyes: EOM, absence of icterus.  Ears, Nose, Mouth and throat: MMM, clear o/p without ulcers/lesions  Neck: supple, symmetrical, trachea midline   Cardiovascular: regular rate and rhythm, S1, S2 normal, no murmur, rubs, or gallop.  No JVD and extremities with no cyanosis or edema.  Pulses 2+ and symmetric   Respiratory: clear to auscultation bilaterally. No crackles or wheezing.  Gastrointestinal: abdomen soft, non-tender; +bowel sounds in all 4 quadrants; no masses, no organomegaly. He has absence of ascites,  absence of caput medusa, absence of surgical scar, absence of hernia.   Rectal examination deferred.  Skin:He has absence of jaundice, absence of spider angiomas. No lesions.   Neurological: grossly normal, absence of asterixis  Psychiatric: flat affect  Extremities: extremities warm and dry. No lower extremity edema    Functional Status:  The patient's current [place] [facility] Modified Karnofsky Score is: Liver  90 - Evidence of Liver disease but able to carry on normal activities  (abnormal lab findings but no interference from disease)      LAB DATA:  Laboratory Data  Lab Results   Component Value Date    NA 137 06/20/2019    NA 136 06/20/2019    K 5.0 06/20/2019    K 5.0 06/20/2019    CL 98 06/20/2019    CL 97 06/20/2019    CO2 27 06/20/2019    CO2 27 06/20/2019    UN 16 06/20/2019    UN 16 06/20/2019    CREAT 0.80 06/20/2019    CREAT 0.81 06/20/2019     Lab  Results   Component Value Date    ALK 888 (H) 06/20/2019    ALK 912 (H) 06/20/2019    TB 1.4 (H) 06/20/2019    TB 1.4 (H) 06/20/2019    DB 0.9 (H) 06/20/2019    ALB 4.4 06/20/2019    ALB 4.5 06/20/2019    ALT 246 (H) 06/20/2019    ALT 252 (H) 06/20/2019    AST 170 (H) 06/20/2019    AST 184 (H) 06/20/2019     Lab Results   Component Value Date    WBC 5.6 06/20/2019    WBC 5.4 06/20/2019    HGB 17.3 06/20/2019    HGB 16.8 06/20/2019    HCT 51 06/20/2019    HCT 50 06/20/2019      MCV 89 06/20/2019    MCV 91 06/20/2019    PLT 290 06/20/2019    PLT 246 06/20/2019     No results found for: LIP, AMY   Lab Results   Component Value Date    INR 0.9 06/20/2019    INR 0.9 06/20/2019       Comprehensive lab study date    Iron profile (06/20/19): Iron 203, TIBC 392, Trans sat 52, Ferritin 121  Genetics (06/20/19): ceruloplasmin 37, alpha-1-antrypsin 120  Autoimmune: pending  Tumor markers (06/20/19): AFP 3, CA 19-9 137, CEA 3.0     Tox screen (06/20/19): Negative, ethyl glucuronide pending  Blood type: (06/20/19): O Rh positive      Assessment:  Kyle Bright is a 42 y.o. male with history of Ulcerative colitis s/p total colectomy with J-pouch anastomosis and PSC without cirrhosis, found to have CBD biopsy with low grade dysplasia who presents for initial transplant evaluation.     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  - recommend DEXA exams Bright 2-3 years through your PCP     # Gallbladder cancer and cholangiocarcinoma Surveillance  - Should have abdominal imaging and CA 19-9 Bright 6 months.  - Last CA 19-9: 137 (06/20/19). Repeat due 12/2019  - Last imaging: MRCP in 09/20/18. CT scan repeated on 06/20/19    # HCC Surveillance  - Should have abdominal imaging and AFP Bright 6 months.  - Last AFP: 3 (06/20/19). Repeat due 12/2019  - Last Ultrasound/Imaging: CT A/P (06/20/19). Repeat due 12/2019       # Transplant Candidacy  - Meets transplant criteria given history of PSC with low grade dysplasia  in setting of persistent fatigue and frequent ERCPs and transplantation would likely benefit his quality of life.     Infectious Disease:   - Immunization Status: pending review of serologies for MMR and VZV. Will need hepatitis A vaccination and re-immunization for Hepatitis B.   - TB Screening: pending. If positive will require referral to Transplant ID.  - Dental Clearance: pending  - Infection History/Risk: Hepatitis C Ab: negative, Hepatitis C PCR: pending, Hepatitis C Genotype: N/a.  HIV: negative      Cardiac Testing:  - Dobutamine Stress Test: Date 06/20/19. Impression: Normal LVEF without significant wall motion abnormalities. No significant valvular disease. Unable to determine RV systolic pressure, but normal tricuspid valve and right heart. Target Heart Rate Reached: Yes. Heart Rate 160. Ischemia: negative   - Cardiac Catheterization: n/a  - Cardiac Consult Required: no     Pulmonology:  - Chest CT: Date: 06/20/19. Impression: No acute cardiac thoracic abnormality. Scattered nonspecific tiny (less than 4 mm) pulmonary nodules   - If lung nodules and or marijuana use transplant ID consult: No  - Pulmonary Function Test: None   - Arterial Blood Gas: Date n/a  - Pulmonary Consult Required: No      Abdominal/Pelvis Imaging:   - CT/MRI: Date: 06/20/19. Impression: Unchanged sequelae of primary sclerosing cholangitis, with scattered mild intrahepatic ductal dilation. Unchanged debris-containing cystic lesion within the left hepatic lobe, which may represent a choledochal cyst. No otherwise suspicious focal hepatic lesions. Hepatic arterial anatomy appears to be conventional. Patent portal vein. Stable sequelae of total colectomy and J-pouch formation, with unchanged cluster of mildly enlarged perirectal/deep right pelvic lymph nodes. No new or growing abdominopelvic lymph nodes.   - Hepatic Vasculature: Pertinent Findings: None.  History of portal vein thrombus: No.  - MRCP:   Date: 09/20/18. Impression: Multifocal  mild intrahepatic biliary ductal dilatation compatible with history of PSC, similar to prior imaging. In addition, there is mild focal stricture involving the proximal common hepatic duct, without upstream biliary ductal dilatation, this could be a benign or malignant stricture. Stable 3 cm complex cystic lesion in segment 2/3 with large amount of layering material but no enhancement.    Endocrine:   - Patient is a diabetic: no  - Endocrine Referral Required: N/A    Neurology:   - Neurology Referral Required: no    Hematology:  - Hematology Referral Required: no    Health Maintenance:  - Colonoscopy (age 50 or greater, or sooner for family history): Up to date: yes: 12/24/17. Impression: unclear, need to request report. Recommendation given PSC/UC is annual colonoscopy for evaluation of residual rectum if present   - PSA: n/a. PSA (age 50 or greater, or sooner for family history):  Up to date: N/A  - Urinalysis (Evaluate for hematuria): Date 06/20/19 Result: see below       Lab results: 06/20/19  1235   Appearance,UR Clear   Color, UA Yellow   Glucose,UA NORM   Ketones, UA NEG   Specific Gravity,UA 1.021   Blood,UA NEG   pH,UA 6.0   Protein,UA 30*   Nitrite,UA NEG   Leuk Esterase,UA NEG   RBC,UA 1   WBC,UA 1   Mucus,UA Present       (For age 50 or greater, or sooner for family history of bladder cancer): Up to date: yes    Psycho/Social Concerns:    - History of Alcohol Use: no Last use: Denies    - History of Drug Use: no Last use: Denies    - History of Smoking: no Last use: Denies   - Support Concerns: no Identifies Jessica (partner) as primary support    Other:  - Request for outside records to include: colonoscopy/flexible sigmoidoscopy report from primary GI   - All of the above testing was reviewed by myself and Dr. .   - The following items will need to be reviewed prior to selection committee as they were not available for review at the time of patients clinic visit: HCV/HBV PCR, Autoimmune  serologies, Tb antigen. Will need to add MMR/VZV serologies   - The case will be presented in liver transplant selection committee with further recommendations to follow.    Patient's case reviewed and plan discussed with Dr.      We will see him back for clinical follow up in 3 months or sooner if medically necessary.    Kyle Young, MD   PGY-5, Gastroenterology and Hepatology Fellow   I saw and evaluated the patient. I agree with the resident's/fellow's findings and plan of care as documented above.     , MD

## 2019-06-21 NOTE — H&P (Signed)
Transplant Surgery New Liver Recipient Evaluation  Patient: Kyle Bright    Attending: Dr. Vickii Penna        CC: Liver Evaluation  LOS: 0 days    HPI: Kyle Bright is a 43 y.o. male with h/o of ulcerative colitis s/ps TAC and subsequent ileostomy reversal and j-pouch creation in 2010 , [place], who presents to the clinic for liver evaluation.     The patient last presented to the clinic in October of 2019 with complaints of fatigue and myalgias as well as occasional diarrhea. These symptoms started at the end of 2018 and was found to have elevated transaminases. He underwent an ERCP x 2 with the most recent one in August of 2019 which was concerning for reactive changes. The ERCP was concerning stricture on upper third of CBD. Brushing was reviewed by our pathologist and there is no obvious finding compatible with cholangiocarcinoma. 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      Overall, the patient has continued to experience significant fatigue with diarrhea going to the bathroom up to 9 times per day. 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 He states that he was suppose to graduate last year in May but had to put everything on hold to deal with his health issues. He has an ok appetite and his weight continues to be stable at 120s.     Of note, patient was noted to have multiple pulmonary nodules small than 4 mm.         They report stigmata of liver disease which includes Fatigue.      The current MELD score is:  MELD-Na score: 8 at 06/20/2019 12:35 PM  MELD score: 8 at 06/20/2019 12:35 PM  Calculated from:  Serum Creatinine: 0.80 mg/dL (Rounded to 1 mg/dL) at 06/20/2019 12:35 PM  Serum Sodium: 137 mmol/L at 06/20/2019 12:35 PM  Total Bilirubin: 1.4 mg/dL at 06/20/2019 12:35 PM  INR(ratio): 0.9 (Rounded to 1) at 06/20/2019 12:35 PM  Age: 35 years 8 months     Past Medical History:   Past Medical History:   Diagnosis Date    Anemia     Liver disease      Osteoporosis     Palpitations     Pouchitis     PSC (primary sclerosing cholangitis)     Ulcerative colitis        Past Surgical History:   Past Surgical History:   Procedure Laterality Date    COLONOSCOPY      TOTAL COLECTOMY         Medications:  Current Outpatient Medications   Medication Sig    amoxicillin-clavulanate (AUGMENTIN) 500-125 MG per tablet Take 1 tablet by mouth 2 times daily    mesalamine (LIALDA) 1.2 GM EC tablet Take 4.8 g by mouth daily (with breakfast)     loperamide (IMODIUM) 2 MG capsule Take 2 mg by mouth 3 times daily as needed     multi-vitamin (TAB-A-VITE/BETA CAROTENE) per tablet Take 1 tablet by mouth every morning     calcium-vitamin D (OSCAL-500) 500-200 MG-UNIT per tablet Take 1 tablet by mouth every morning     cholecalciferol (VITAMIN D) 50 MCG (2000 UT) tablet Take 2,000 Units by mouth every morning     Probiotic Product (VSL#3) CAPS Take 1 capsule by mouth every morning      No current facility-administered medications for this visit.  Allergies:   Allergies   Allergen Reactions    Adhesive Tape Rash    Sulfa Antibiotics Rash     Rash on legs        Family History:   family history includes Crohn's disease in his brother; Ulcerative colitis in his brother.    Social History:   Social History     Tobacco Use    Smoking status: Never Smoker    Smokeless tobacco: Never Used   Substance Use Topics    Alcohol use: Not Currently        Recent Hospitalization No   Family hx of Sudden Cardiac Death? Personal Hx of MI? No   History of DVT/PE/Family history of same or hypercoagulable disorder No   Recent Illness/Sick Contacts:  No   Abdominal Surgery: TAC, ileostomy with reversal and J pouch creation in 2010   Previous anaesthesia issues (immediate family included) None   Currently on Pain Meds? (who is following) No       Functional Status  Ability to perform ADLs Able to perform all ADLs     At least 4 METS (walk up one flight of stairs or perform heavy  housework like scrubbing floors)  If <4, needs cards clearance yes            Review of Systems: (Use Smart Block, Right Click on ROS )  Review of Systems   Constitutional: Positive for malaise/fatigue and weight loss. Negative for chills, diaphoresis and fever.   HENT: Negative.    Eyes: Negative.    Respiratory: Negative.    Cardiovascular: Negative.    Gastrointestinal: Negative.    Genitourinary: Negative.    Musculoskeletal: Negative.    Skin: Negative.    Neurological: Negative.    Endo/Heme/Allergies: Negative.    Psychiatric/Behavioral: Negative.      Physical Examination: (address all fields in smart block, full head to toe)  Vitals:  Temp:  [36.8 C (98.2 F)] 36.8 C (98.2 F)  Heart Rate:  [86] 86  Resp:  [12] 12  BP: (104)/(70) 104/70       Physical Exam   Constitutional: He is oriented to person, place, and time. No distress.   HENT:   Head: Normocephalic and atraumatic.   Mouth/Throat: No oropharyngeal exudate.   Eyes: Right eye exhibits no discharge. Left eye exhibits no discharge. No scleral icterus.   Neck: No tracheal deviation present. No thyromegaly present.   Cardiovascular: Normal rate, regular rhythm, normal heart sounds and intact distal pulses. Exam reveals no gallop and no friction rub.   No murmur heard.  Pulmonary/Chest: No respiratory distress. He has no wheezes. He has no rales. He exhibits no tenderness.   Abdominal: Soft. Bowel sounds are normal. He exhibits no distension. There is no abdominal tenderness. There is no rebound and no guarding.   Musculoskeletal:         General: No tenderness, deformity or edema.   Neurological: He is oriented to person, place, and time. GCS score is 15.   Skin: Skin is warm and dry. No rash noted. He is not diaphoretic. No erythema. No pallor.       Encephalopathy Stage/Grade Choose one  (None (0), 1-2, 3-4)    Grade 0 - Minimal hepatic encephalopathy (also known as CHE and previously known subclinical hepatic encephalopathy); lack of detectable  changes in personality or behavior; minimal changes in memory, concentration, intellectual function, and coordination; asterixis is absent.    Grade 1 - Trivial lack of awareness;  shortened attention span; impaired addition or subtraction; hypersomnia, insomnia, or inversion of sleep pattern; euphoria, depression, or irritability; mild confusion; slowing of ability to perform mental tasks    Grade 2 - Lethargy or apathy; disorientation; inappropriate behavior; slurred speech; obvious asterixis; drowsiness, lethargy, gross deficits in ability to perform mental tasks, obvious personality changes, inappropriate behavior, and intermittent disorientation, usually regarding time    Grade 3 - Somnolent but can be aroused; unable to perform mental tasks; disorientation about time and place; marked confusion; amnesia; occasional fits of rage; present but incomprehensible speech    Grade 4 - Coma with or without response to painful stimuli 0   Ascites - choose one  (Absent, Slight- controlled by diureticss, At least moderate despite treatment) Absent        Lab Results:     _0 (NA:3,K:3,CL:3,CO2:3,UN:3,CREAT:3,GLU:3,CA:3,MG:3,PHOS:3,PREALBUMIN)@ _1 (APTT:3,INR:3,PTT:3,PTI:3)@   _2 (TB:3,DB:3,AST:3,ALT:3,ALK:3,ALB:3)@ _3 (wbc:3,hgb:3,hct:3,PLT:3;CRP:3,ESR)@     _4 (TACRR:3,CSAM:3, SIR:3)@  _5 (PGLU:6)@     No results found for: BKVQL No results found for: CMVQL     Recent Microbiology Data has been reviewed.  Radiology  Ct Abdomen And Pelvis With Contrast    Result Date: 06/21/2019  1. Unchanged sequelae of primary sclerosing cholangitis, with scattered mild intrahepatic ductal dilation. Unchanged debris-containing cystic lesion within the left hepatic lobe, which may represent a choledochal cyst. No otherwise suspicious focal hepatic lesions. 2. Hepatic arterial anatomy appears to be conventional. Patent portal vein. 3. Stable sequelae of total colectomy and J-pouch formation, with  unchanged cluster of mildly enlarged perirectal/deep right pelvic lymph nodes. No new or growing abdominopelvic lymph nodes. 4. Please refer to separately dictated CT chest for findings above the diaphragm. END OF IMPRESSION    Ct Chest With Contrast    Result Date: 06/20/2019  No acute cardiac thoracic abnormality. Scattered nonspecific tiny (less than 4 mm) pulmonary nodules. END OF IMPRESSION I have personally reviewed the images and the Resident's/Fellow's interpretation and agree with or edited the findings. [facility] submits this DICOM format image data and final report to the [facility], an independent secure electronic health information exchange, on a reciprocally searchable basis (with patient authorization) for a minimum of 12 months after exam date.        Assessment/Plan: Cypher Paule is a 43 y.o. male with history ulcerative colitis s/p Total   Abdominal Colectomy and subsequent ileostomy takedown and j-pouch creation and PSC s/p ERCP with brushings in January 2019 and August 2019 due to transaminitis and worsening fatigue/malaise (AST 170, ALT 246). Brushings were concerning for reactive process/inflammation which is likely consistent with the patient's diagnosis of [place]. 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 He does not have any current direct imaging evidence or stigmata of cirrhosis or liver failure at this time and further workup is likely needed.     Moving forward we discussed that the patient will be need to be approved. For his pulmonary nodule we will get transplant ID on board to assess if there infectious in nature. We also discussed what his surgery would entail given prior surgical history which includes Roux-Y- for attachment of the new liver.       Transplant Considerations:    Would we accept a living donor for this patient based on BMI and body fat distribution, anatomy and past surgical history: yes    Would we accept a DCD donor for this patient based on BMI and  body fat distribution, anatomy and past surgical history:  yes    Cardiac:   - Right heart  function:The size of the right ventricular cavity is small. RV wall thickness appears to be normal. RV ejection fraction is normal. There is normal septal motion    Pulmonary:   PFTs: To be considered if active pulmonary disease. Will be discussed at Transplant selection committee to determine if further testing is required.    Decompensated Disease:  (portal hypertension, malnutrition, jaundice, GI bleeds, varices, ascites)    Liver Lesion/other malignancy: Unchanged cystic lesion in the Left hepatic lobe    Hepatic Vasculature: Hepatic arterial anatomy appears to be conventional. Patent portal vein.     Previous Pertinent Abdominal Surgical Hx:  -Total abdominal colectomy with ileostomy and reversal     Co-morbidities: Ulcerative colitis    DM: No.        HTN: No.   BP Readings from Last 3 Encounters:   06/21/19 104/70   06/20/19 104/69   01/10/19 97/68         Author: Janee Morn, MD as of: 06/21/2019  at: 9:17 AM       Attending Attestation:    Mr. Brylen Wagar is a 43 y.o. 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 They were seen today for evaluation of liver transplant candidacy. Their stigmata of liver disease include fatigue.     I first discussed the rationale for liver transplantation as a therapy primarily designed to treat the acute and chronic liver failure with an imminent threat to life and associated significant mortality left untreated within the next several years. I also discussed the use of liver transplantation as a treatment for primary liver cancer within certain guidelines. As such, I discussed the relative expected mortalities for liver transplantation being approximately 10-15% within the first year at approximately 30% at 5 years. We also specifically shared our program specific survival statistics.     I then discussed the general allocation scheme for livers within the state of [place]. I specifically  reviewed our relatively poor organs supply, the option to list at another center outside of our region, as well as the options of receiving various types of donors, including: Living donors, standard criteria cadaver donors, as well as extended criteria donor's. I elaborated upon the MELD scoring system and its use in the allocation of liver grafts. I also discussed the relatively high scores necessary to receive any cadaver liver graft within our [place] region.     I discussed the necessity for hepatocellular carcinoma screening in any patient with cirrhosis. I also discussed the allocation of liver graphs for patients with small hepatocellular carcinomas within the region using the regionally approved MELD exception point scheme.     I then discussed the surgical risks of liver transplantation including the mortality as reviewed above and specifically the risks of graft failure, need for retransplant, technical complications of arterial or venous thrombosis, bile duct stricture or leak, wound infection or hernia, and the general complications of bleeding and infection. I further discussed other potential complications of transplantation including graft rejection, infection related to immunosuppression, transmission of donor-related diseases, and the unanticipated issues of renal failure, respiratory failure, and neurologic injury.     I specifically discussed the need for lifelong immunosuppressive therapy. I also discussed the infectious, oncologic, and metabolic side effects of immunosuppressive agents. We also discussed the nephrologic and neurologic impairment associated with calcineurin inhibitors.     I discussed the requirement for long term followup with the transplant program, but emphasized the long-term diminution of immunosuppressive therapy as well as the requirement for less  frequent visits to the transplant center as time progressed.     Finally I discussed the minimal hospital stay for liver  transplant being approximately 10-14 days but that complications frequently occur with liver transplantation and some patience require prolonged hospitalization. We also reviewed the chronic deconditioning associated with long-term organ failure in the necessity to rehabilitate much of the preoperative general dysfunction. I specifically reminded the patient that the minimum time to returning to work or usuall activity is at least 3 months, but significantly deconditioned patients will often not return to full function for 6-9 months.     In summary, Teagen Mcleary history ulcerative colitis s/p Total Abdominal Colectomy and subsequent ileostomy takedown and j-pouch creation and PSC s/p ERCP with brushings in January 2019 and August 2019 due to transaminitis and worsening fatigue/malaise (AST 170, ALT 246). Brushings were concerning for reactive process/inflammation which is likely consistent with the patient's diagnosis of [place]. 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   At this point it is considered that he has benefit from liver transplant and I do not see obvious contraindication from surgical standpoint. We are going to discuss him at the selection committee.    I saw and evaluated the patient. I agree with the resident's/fellow's findings and plan of care as documented above.    Elvin So, MD               Author: Janee Morn, MD  Date: 06/21/2019  Time: 9:17 AM

## 2019-06-21 NOTE — Progress Notes (Signed)
error 

## 2019-06-21 NOTE — Progress Notes (Addendum)
Hospital Dentistry ([facility]) Consult Note    Patient's Name: Kyle Bright  MRN:  [id number]  DOB/Age/Gender:  [birth year]43 y.o./male    PCP:  Provider, Unknown (None)    Reason for Consultation:  Kyle Bright is referred to [facility] for dental clearance for liver transplant.    Chief Complaint:  Dental clearance for liver transplant surgery  Pain score:  0/10    Past Medical Hx:   Past Medical History:   Diagnosis Date    Anemia     Liver disease     Osteoporosis     Palpitations     Pouchitis     PSC (primary sclerosing cholangitis)     Ulcerative colitis        Past Surgical Hx:   Past Surgical History:   Procedure Laterality Date    COLONOSCOPY      TOTAL COLECTOMY         Medications:   Prior to Admission medications    Medication Sig Start Date End Date Taking? Authorizing Provider   amoxicillin-clavulanate (AUGMENTIN) 500-125 MG per tablet Take 1 tablet by mouth 2 times daily 01/10/19   Juline Patch, MD   mesalamine (LIALDA) 1.2 GM EC tablet Take 4.8 g by mouth daily (with breakfast)  11/29/18   [provider]   loperamide (IMODIUM) 2 MG capsule Take 2 mg by mouth 3 times daily as needed     [provider]   multi-vitamin (TAB-A-VITE/BETA CAROTENE) per tablet Take 1 tablet by mouth every morning     [provider]   calcium-vitamin D (OSCAL-500) 500-200 MG-UNIT per tablet Take 1 tablet by mouth every morning     [provider]   cholecalciferol (VITAMIN D) 50 MCG (2000 UT) tablet Take 2,000 Units by mouth every morning     [provider]   Probiotic Product (VSL#3) CAPS Take 1 capsule by mouth every morning  12/31/17   [provider]      (home meds)  Current Outpatient Medications   Medication    amoxicillin-clavulanate (AUGMENTIN) 500-125 MG per tablet    mesalamine (LIALDA) 1.2 GM EC tablet    loperamide (IMODIUM) 2 MG capsule    multi-vitamin (TAB-A-VITE/BETA CAROTENE) per tablet    calcium-vitamin D (OSCAL-500) 500-200 MG-UNIT per  tablet    cholecalciferol (VITAMIN D) 50 MCG (2000 UT) tablet    Probiotic Product (VSL#3) CAPS     No current facility-administered medications for this visit.       (current meds)    Allergies:  Adhesive tape and Sulfa antibiotics    Social History: Aadon  reports that he has never smoked. He has never used smokeless tobacco. He reports previous alcohol use. He reports previous drug use.     VS: There were no vitals taken for this visit.    Labs:  Recent Results (from the past 72 hour(s))   ABO/Rh    Collection Time: 06/20/19 12:35 PM   Result Value Ref Range    ABO RH Blood Type O RH POS    Alpha-1-antitrypsin    Collection Time: 06/20/19 12:35 PM   Result Value Ref Range    A-1 Antitrypsin 174 90 - 200 mg/dL   CBC    Collection Time: 06/20/19 12:35 PM   Result Value Ref Range    WBC 5.6 4.2 - 9.1 THOU/uL    RBC 5.7 4.6 - 6.1 MIL/uL    Hemoglobin 17.3 13.7 - 17.5 g/dL    Hematocrit 51 40 -  51 %    MCV 89 79 - 92 fL    MCH 30 26 - 32 pg/cell    MCHC 34 32 - 37 g/dL    RDW 12.5 11.6 - 14.4 %    Platelets 290 150 - 330 THOU/uL   CEA    Collection Time: 06/20/19 12:35 PM   Result Value Ref Range    CEA 3.0 0.0 - 4.7 ng/mL   Comprehensive metabolic panel    Collection Time: 06/20/19 12:35 PM   Result Value Ref Range    Sodium 137 133 - 145 mmol/L    Potassium 5.0 3.3 - 5.1 mmol/L    Chloride 98 96 - 108 mmol/L    CO2 27 20 - 28 mmol/L    Anion Gap 12 7 - 16    UN 16 6 - 20 mg/dL    Creatinine 0.80 0.67 - 1.17 mg/dL    GFR,Caucasian 110 *    GFR,Black 127 *    Glucose 79 60 - 99 mg/dL    Calcium 10.3 9.0 - 10.3 mg/dL    Total Protein 8.1 (H) 6.3 - 7.7 g/dL    Albumin 4.4 3.5 - 5.2 g/dL    Bilirubin,Total 1.4 (H) 0.0 - 1.2 mg/dL    AST 170 (H) 0 - 50 U/L    ALT 246 (H) 0 - 50 U/L    Alk Phos 888 (H) 40 - 130 U/L   Drug screen chemical dependency, urine    Collection Time: 06/20/19 12:35 PM   Result Value Ref Range    Amphetamine,UR NEG     Cocaine/Metab,UR NEG     Benzodiazepinen,UR NEG     Opiates,UR NEG     THC  Metabolite,UR NEG    Ethanol, urine    Collection Time: 06/20/19 12:35 PM   Result Value Ref Range    Ethanol,UR NEG    Ferritin    Collection Time: 06/20/19 12:35 PM   Result Value Ref Range    Ferritin 121 20 - 250 ng/mL   Fibrinogen    Collection Time: 06/20/19 12:35 PM   Result Value Ref Range    Fibrinogen 427 (H) 172 - 409 mg/dL   GgT    Collection Time: 06/20/19 12:35 PM   Result Value Ref Range    GGT 592 (H) 8 - 61 U/L   Protime-INR    Collection Time: 06/20/19 12:35 PM   Result Value Ref Range    Protime 10.7 10.0 - 12.9 sec    INR 0.9 0.9 - 1.1   APTT    Collection Time: 06/20/19 12:35 PM   Result Value Ref Range    aPTT 40.8 (H) 25.8 - 37.9 sec   Ceruloplasmin    Collection Time: 06/20/19 12:35 PM   Result Value Ref Range    Ceruloplasmin 37 (H) 15 - 30 mg/dL   Urinalysis with reflex to microscopic    Collection Time: 06/20/19 12:35 PM   Result Value Ref Range    Color, UA Yellow Yellow    Appearance,UR Clear Clear    Specific Gravity,UA 1.021 1.002 - 1.030    Leuk Esterase,UA NEG NEGATIVE    Nitrite,UA NEG NEGATIVE    pH,UA 6.0 5.0 - 8.0    Protein,UA 30 (!) NEGATIVE mg/dL    Glucose,UA NORM mg/dL    Ketones, UA NEG NEGATIVE    Blood,UA NEG NEGATIVE   PSA (eff.02-2009)    Collection Time: 06/20/19 12:35 PM   Result Value Ref Range  PSA (eff. 02-2009) 0.18 0.00 - 4.00 ng/mL   CA 19 9 (eff. 11-2009)    Collection Time: 06/20/19 12:35 PM   Result Value Ref Range    CA 19 9 (eff. 11-2009) 134 (H) 0 - 35 U/mL   AFP tumor marker    Collection Time: 06/20/19 12:35 PM   Result Value Ref Range    AFP (eff. 02-2009) 3 0 - 7 IU/mL   Liver TX recipient workup panel    Collection Time: 06/20/19 12:35 PM   Result Value Ref Range    HIV 1&2 ANTIGEN/ANTIBODY Nonreactive     Hepatitis A IGG NEG     HBV S Ag NEG     HBV S Ab NEG     HBV S Ab Quant 2.84 mIU/mL    HBV Core Ab NEG     HBV Interp see below     Hep C Ab NEG     Measles IgG POSITIVE     Mumps IgG POSITIVE     Rubella IgG AB POSITIVE     VZV IgG POSITIVE     HSV  1 IgG <0.20 AI    HSV 2 IgG <0.20 AI    Syphilis Screen Neg     Syphilis Status Nonreact    TIBC    Collection Time: 06/20/19 12:35 PM   Result Value Ref Range    Iron 203 (H) 45 - 170 ug/dL    TIBC 392 250 - 450 ug/dL    Transferrin Saturation 52 20 - 55 %   Bilirubin, direct    Collection Time: 06/20/19 12:35 PM   Result Value Ref Range    Bilirubin,Direct 0.9 (H) 0.0 - 0.3 mg/dL   CBC/PLT    Collection Time: 06/20/19 12:35 PM   Result Value Ref Range    WBC 5.4 4.2 - 9.1 THOU/uL    RBC 5.6 4.6 - 6.1 MIL/uL    Hemoglobin 16.8 13.7 - 17.5 g/dL    Hematocrit 50 40 - 51 %    MCV 91 79 - 92 fL    MCH 30 26 - 32 pg/cell    MCHC 33 32 - 37 g/dL    RDW 12.5 11.6 - 14.4 %    Platelets 246 150 - 330 THOU/uL   Comprehensive metabolic panel    Collection Time: 06/20/19 12:35 PM   Result Value Ref Range    Sodium 136 133 - 145 mmol/L    Potassium 5.0 3.3 - 5.1 mmol/L    Chloride 97 96 - 108 mmol/L    CO2 27 20 - 28 mmol/L    Anion Gap 12 7 - 16    UN 16 6 - 20 mg/dL    Creatinine 0.81 0.67 - 1.17 mg/dL    GFR,Caucasian 109 *    GFR,Black 126 *    Glucose 67 60 - 99 mg/dL    Calcium 10.4 (H) 9.0 - 10.3 mg/dL    Total Protein 6.9 6.3 - 7.7 g/dL    Albumin 4.5 3.5 - 5.2 g/dL    Bilirubin,Total 1.4 (H) 0.0 - 1.2 mg/dL    AST 184 (H) 0 - 50 U/L    ALT 252 (H) 0 - 50 U/L    Alk Phos 912 (H) 40 - 130 U/L     *Note: Due to a large number of results and/or encounters for the requested time period, some results have not been displayed. A complete set of results can be found in Results Review.  Clinical Evaluation:  Submandibular lymph nodes:  Non-tender, non-palpable.  no lymphadenopathy noted.      Face and neck:  no extra-oral swelling, no facial asymmetry, no tenderness to palpation noted.  Lips:  Lips are WNL.        TMJ:  no pain or clicking noted on bilateral TMJ, no pain/discomfort/limitation of motion on wide opening and lateral excursion.          Oral mucosa:  Soft tissues appear pink and moist.  no intra-oral  swelling noted.  no elevation of floor of mouth noted.  No tenderness to palpation.  Uvula is at midline.           Teeth:   Missing:  none   Amalgams:  #17,32   Composites:  #31   Crowns:  #30   RCT:  #30      Mobility: none     Gingiva:  Gingiva appears erythematous generally.  Radiographic Evaluation:  TMJ: WNL        Alveolar bone:  Appears well-trabeculated.    Periapical radiolucencies:  No periapical radiolucencies noted.  no widened PDL spaces noted.   Furcation involvement:  no furcation inolvement noted.     Missing: none  Retained roots:   none         Impacted teeth/buds:  none  Caries:  #17-O    Diagnosis:  Symptomatic decayed tooth #17.    Assessment:  Tremond Shimabukuro is referred to [facility] for dental clearance for liver transplant surgery.  Cylas  has a past medical history of Anemia, Liver disease, Osteoporosis, Palpitations, Pouchitis, PSC (primary sclerosing cholangitis), and Ulcerative colitis. Dental findings as documented above.     Discussed [place]'s medical history, medications, allergies, relevant lab results, relevant dental radiographs, vital signs, and treatment plan with attending.       Patient recently has pain on #17 with recurrent decay after amalgam restoration done with his own dentist about 4 years ago.     Plan:    1. COVID-19 test done before extraction appointment 06/26/19  2. Extraction #17 06/26/19 at 10:30AM    Treatment Completed this Visit:  1. Dental examination/consultation completed.  Radiographs taken as appropriate.  Findings as noted above.  Discussed findings with Olen Cordial.  Discussed treatment options, risks/benefits of treatment options, and plan as detailed above.  All questions answered.  Kisean agrees with plan.       For any questions/concerns, please contact the Dental Resident On-Call.    Magnus Ivan, DDS  [facility] Dental Resident

## 2019-06-21 NOTE — Progress Notes (Signed)
Transplant Social Work Psychologist, sport and exercise met with patient and partner Kyle Bright during today's AC-2 LRN evaluation to check-in and obtain HCP.  Writer's contact with patient was via face-to-face PPE used: mask and eye protection.  Was patient masked during visit: yes. SW made copy of very detailed document "5 Wishes" HCP form, to be scanned into eRecord.  Patient trying to gauge trajectory for health status (LDLT vs. DDLT) and abilities to proceed with graduate school and potential job search for professorship.  He is concerned group graduate student counseling will not continue this fall due to [place], wondering about other options. SW reviewed Probation officer can also search for local therapist and provide list to patient if needed.  If patient were to become too sick to work and lose insurance, SW reviewed role of Museum/gallery curator to assist with identifying potential coverage options (Medicaid, etc).  SW reviewed option to connect with a post-transplant patient for support if interested.  SW invited call with any concerns or questions.    Kyle Bright, MPH, LMSW  Social Worker  Solid Organ Transplant  PIC [phone]

## 2019-06-22 ENCOUNTER — Other Ambulatory Visit: Payer: Self-pay

## 2019-06-22 ENCOUNTER — Telehealth: Payer: Self-pay

## 2019-06-22 LAB — HEPATITIS C QUANTITATIVE/GENOTYPE
HCV PCR log: NOT DETECTED IU/mL
HCV PCR: NOT DETECTED IU/mL

## 2019-06-22 LAB — ETHYL GLUCURONIDE SCR W/REFLEX TO CONFIM,UR: Ethyl glucuronide screen,Ur: NEGATIVE

## 2019-06-22 NOTE — Progress Notes (Signed)
Transplant Pharmacy Listing Meeting Note:    Medication list in eRecord reviewed. No pharmacologic contraindication to listing identified. Patient will need to discontinue the use of probiotics at the time of transplant.     Patient is a candidate for methylprednisolone induction therapy given current renal function. This will need to be reviewed at the time of transplant.     Harlene Salts, PharmD, [phone]  Clinical Pharmacist, Abdominal Transplant

## 2019-06-22 NOTE — Telephone Encounter (Signed)
Spoke with patient about extraction appointment need for  Dental clearance. He would like to go some where closer to home, he  Wishes not to make the  Many road trips  To [place]. Also  He  Wants to  Go somewhere that participates with  His insurance.

## 2019-06-23 LAB — HEPATITIS B DNA, QUANTITATIVE, PCR
HBV PCR log: NOT DETECTED IU/mL
HBV PCR: NOT DETECTED IU/mL

## 2019-06-23 LAB — MITOCHONDRIAL ANTIBODIES, M2: Mitochondrial Ab: 7.4 Units (ref 0.0–20.0)

## 2019-06-23 LAB — F-ACTIN ANTIBODY: F-Actin IgG: 16 Units (ref 0–19)

## 2019-06-23 LAB — TB AG T-CELL STIMULATION: TB Ag T-Cell Stimulation: 0

## 2019-06-24 LAB — ALPHA 1 ANTITRYPSIN PHENOTYPE (PI): ALPHA 1 ANTITRYPSIN: 175 mg/dL (ref 90–200)

## 2019-06-26 ENCOUNTER — Encounter: Payer: Self-pay | Admitting: Transplant Surgery Liver and Kidney

## 2019-06-29 ENCOUNTER — Encounter: Payer: Self-pay | Admitting: Transplant

## 2019-06-30 ENCOUNTER — Telehealth: Payer: Self-pay

## 2019-06-30 NOTE — Telephone Encounter (Signed)
Took call from patient, returning call from [place]. Please call him back at [phone].

## 2019-07-03 ENCOUNTER — Other Ambulatory Visit: Payer: PRIVATE HEALTH INSURANCE

## 2019-07-04 ENCOUNTER — Ambulatory Visit: Payer: PRIVATE HEALTH INSURANCE | Admitting: Transplant Surgery Liver and Kidney

## 2019-07-06 ENCOUNTER — Telehealth: Payer: Self-pay

## 2019-07-06 DIAGNOSIS — K8301 Primary sclerosing cholangitis: Secondary | ICD-10-CM

## 2019-07-06 NOTE — Telephone Encounter (Signed)
Called pt to review 7/24 Selection Committee decision/recommendations which included that pt is listable pending biopsy of distal bile duct--we will refer pt for ERCP with Dr. Foye Spurling; pt also needs dental clearance--pt had dental consult at [facility], prefers to follow-up locally with recommendations; advised pt to notify Txp Office once he has seen dentist so we may obtain clearance form; pt needs Hep A and B vaccines--pt will follow-up with PCP.  Listing is also pending financial and anesthesia clearance.  Pt verbalized understanding.    Advised pt potential donors may start calling Txp Office.

## 2019-07-24 ENCOUNTER — Telehealth: Payer: Self-pay

## 2019-07-24 NOTE — Telephone Encounter (Signed)
Received call from pt who inquired if ERCP has been scheduled--advised pt I will follow-up, he will be notified once scheduled.  Pt reported he currently has two dental appointments set up locally, however, depending when ERCP is scheduled, he may request dental appt at [facility].  Pt also inquired if Hep A and B vaccines need to be completed prior to listing; advised pt vaccines will not hold up listing.  Pt verbalized understanding.

## 2019-07-25 NOTE — Telephone Encounter (Signed)
Called GI and they need a new referral for ERCP or a note sent to Dr. Foye Spurling.

## 2019-08-01 ENCOUNTER — Telehealth: Payer: Self-pay

## 2019-08-01 NOTE — Telephone Encounter (Signed)
Warm transfer from [place]:    Hoyle Sauer from Transplant called requesting ERCP appointment to be scheduled with Dr. Evern Bio. Writer stated a message will need to be sent to provider asking if it was ok being that there was no referral/note from provider found for a repeat ERCP. Informed caller that she will get a call back once provider answers back. Hoyle Sauer can be reached at [phone].

## 2019-08-01 NOTE — Telephone Encounter (Signed)
-----   Message from Hinton Lovely, RN sent at 07/31/2019 10:08 PM EDT -----  Hoyle Sauer,       I updated the ERCP order--please schedule ERCP asap with Dr. Ullah--Dr. Vickii Penna would like to speak to Dr. Foye Spurling at time of ERCP.  Thank you,    Tami

## 2019-08-01 NOTE — Telephone Encounter (Signed)
Spoke to [place] in Dr. Jeanann Lewandowsky office. She will send message to Dr. Foye Spurling and then get back to me with an appointment.

## 2019-08-03 ENCOUNTER — Telehealth: Payer: Self-pay | Admitting: Gastroenterology

## 2019-08-03 NOTE — Telephone Encounter (Signed)
Spoke with pt about procedure tomorrow     Pt states he is able to come but needs a COVID testing site ... will need to speak to Debroah Baller re: [facility] testing site

## 2019-08-03 NOTE — Telephone Encounter (Signed)
Spoke with pt and he is aware that we are not confident that we would get COVID results in time for the procedure

## 2019-08-04 ENCOUNTER — Encounter: Payer: Self-pay | Admitting: Student in an Organized Health Care Education/Training Program

## 2019-08-04 ENCOUNTER — Telehealth: Payer: Self-pay | Admitting: Gastroenterology

## 2019-08-04 DIAGNOSIS — Z01812 Encounter for preprocedural laboratory examination: Secondary | ICD-10-CM

## 2019-08-04 NOTE — Anesthesia Preprocedure Evaluation (Addendum)
Anesthesia Pre-operative History and Physical for Kyle Bright    Highlighted Issues for this Procedure:  43 y.o. male with primary sclerosing cholangitis presenting for ERCP w anesthesia by Dr. Juline Patch, MD scheduled for 60 minutes.    Allergies:  -- Adhesive Tape -- Rash  -- Sulfa Antibiotics -- Rash   --  Rash on legs    Habitus:  - Estimated body mass index is 17.43 kg/m as calculated from the following:   Height as of 06/28/19: 1.753 m (5' 9" ).   Weight as of 06/28/19: 53.5 kg (118 lb).    COVID-19 Lab Results:  No results found for: COVID19PCR          Medical History:  Cardiovascular: No pertinent disease noted.     Pulmonary: No pertinent disease noted.     Endocrine: No pertinent disease noted.     Neurological: No pertinent disease noted.     GI:  - Primary sclerosing cholangitis with stricture  - Ulcerative colitis w/p colectomy in 2010, on mesalamine and loperamide    Hematologic:   - Anemia prior to colectomy  - Osteoporosis      Anesthetic history:  1). Previous ERCP in Feb 2020 with 7.5 ETT, DL Miller 2 blade, Grade 1 view, easy mask. No complications noted.     Key Labs/notable labs:  (06/20/19)  K: 5.0  Glucose: 79  Creatinine: 0.8  AST 170 ALT 246 Alk Phos 888 Tbili 2.4  INR 0.9  Platelets 246  Albumin 4.4      Stress Test/Echocardiography:  7/21 Dobutamine Stress Echo  Normal LVEF without significant wall motion abnormalities. No significant valvular abnormalities. Negative stress echo at the diagnostic workload achieved.      Electrophysiology/AICD/Pacer:  None  Cardiac /Vascular Catheterization:   None  Pulmonary Function Tests:  None    .  Marland Kitchen  Anesthesia Evaluation Information Source: records     ANESTHESIA HISTORY  Pertinent(-):  No History of anesthetic complications    GENERAL     Denies general issues  Pertinent (-):  No history of anesthetic complications    HEENT     Denies HEENT issues PULMONARY     Denies pulmonary issues  Pertinent(-):  No smoking, asthma, shortness of breath, sleep  apnea or COPD    CARDIOVASCULAR     Denies cardiovascular issues  Pertinent(-):  No hypertension    GI/HEPATIC/RENAL    + Bowel Issues          ulcerative colitis  Pertinent(-):  No GERD   Comment: Primary sclerosing cholangitis  UC s/p colectomy in 2010 on mesalamine and loperamide NEURO/PSYCH     Denies neuro/psych issues    ENDO/OTHER     Denies endo issues    HEMATOLOGIC     Denies hematologic issues         Physical Exam Not Completed________________________________________________________________________  PLAN    Possible ASA Score 2  Possible Anesthetic Plan (general)   Induction (routine IV) General Anesthesia/Sedation Maintenance Plan (inhaled agents and neuromuscular blockade for intubation only);  Airway Manipulation (direct laryngoscopy); Airway (cuffed ETT); Line ( use current access); Monitoring (standard ASA); Positioning (prone); PONV Plan (dexamethasone and ondansetron); Pain (per surgical team); PostOp (PACU)    Anesthesia Consent Not Performed

## 2019-08-04 NOTE — Telephone Encounter (Signed)
Procedure Type: ERCP  Sedation Type: GA  Procedure Location: [facility]  Date: 9.8  Arrival time: TBD  Dr. Evern Bio    COVID Testing Dates: 9.5  COVID Testing Site: [place]     Did patient answer yes to any prescreen questions? no    Is the pt on any anticoagulation? no      Patient verbalized understanding that [place] at a [facility] Pre-Procedure Testing Site is required or procedure will be postponed.   Preparation instructions have been sent via: Pt has been given verbal instructions (date/time/location/prep)

## 2019-08-04 NOTE — Telephone Encounter (Signed)
Order has been placed for Covid test.    Tanis Hensarling M Ware-Orr, RN

## 2019-08-05 ENCOUNTER — Encounter: Payer: Self-pay | Admitting: Gastroenterology

## 2019-08-05 DIAGNOSIS — Z01812 Encounter for preprocedural laboratory examination: Secondary | ICD-10-CM

## 2019-08-07 ENCOUNTER — Telehealth: Payer: Self-pay | Admitting: Student in an Organized Health Care Education/Training Program

## 2019-08-07 ENCOUNTER — Telehealth: Payer: Self-pay

## 2019-08-07 NOTE — Telephone Encounter (Signed)
What can I do at this time. I have limited anesthesia spots. I cannot afford to loose them

## 2019-08-07 NOTE — Telephone Encounter (Signed)
Patient called to report he did not get covid testing prior to his procedure  I offered to order one to be done at any [facility] lab - however he lives in [place] and did not wish to get done here.

## 2019-08-07 NOTE — Telephone Encounter (Signed)
Per answering service - patient calling to cancel his appt tomorrow, was not able to have covid 19 testing completed. Called patient & he said that he tried to call GI office to cancel but not able to speak to anyone due to the holiday.  Said he's just trying to let people know.

## 2019-08-08 ENCOUNTER — Ambulatory Visit: Payer: PRIVATE HEALTH INSURANCE | Admitting: Gastroenterology

## 2019-08-08 NOTE — Telephone Encounter (Signed)
Patient was scheduled for ERCP on 9/8 but it was cancelled because patient was unable to have COVID test done on time. Spoke to [email or web address] and they WCB back to reschedule

## 2019-08-08 NOTE — Telephone Encounter (Signed)
Ok

## 2019-08-08 NOTE — Telephone Encounter (Signed)
Reschedule for ERCP next available

## 2019-08-08 NOTE — Telephone Encounter (Signed)
Does this pt need to be rescheduled?

## 2019-08-09 NOTE — Telephone Encounter (Signed)
Procedure Type: ERCP-GA  Sedation Type: GA  Procedure Location: [facility]  Date: 10.23  Arrival time: TBD  Dr. Evern Bio    COVID Testing Dates: 10.20  COVID Testing Site:   [place]: [address], [place], [place] [address]. Please call [phone] and press "0" for drive up testing      Did patient answer yes to any prescreen questions? no    Is the pt on any anticoagulation? no      Patient verbalized understanding that [place] at a [facility] Pre-Procedure Testing Site is required or procedure will be postponed.   Preparation instructions have been sent via: Mychart/mailed

## 2019-08-10 ENCOUNTER — Telehealth: Payer: Self-pay

## 2019-08-10 NOTE — Telephone Encounter (Signed)
Patein tcalled stating he has a couple questions

## 2019-08-16 NOTE — Telephone Encounter (Signed)
Patient is scheduled for ERCP on 10/23 @ 10:30am.

## 2019-08-25 ENCOUNTER — Telehealth: Payer: Self-pay

## 2019-08-25 ENCOUNTER — Encounter: Payer: Self-pay | Admitting: Gastroenterology

## 2019-08-25 NOTE — Telephone Encounter (Signed)
Call made on different encounter.

## 2019-08-25 NOTE — Telephone Encounter (Signed)
This was rescheduled from 09/22/19 per Dr Jeanann Lewandowsky request    Procedure Type: ERCP  Sedation Type: GA  Procedure Location: [facility]  Procedure Date: 08/29/19  Arrival time: 0630  Dr.: Evern Bio    Anticoag: NA  Coag ordering provider: NA  Phone number of anticoag ordering provider: NA      COVID Testing Dates: 08/26/19  COVID Testing Site: [facility], PLEASE FAX THE STAT ORDER TO [phone]  PT IS CALLING TO MAKE HIS APPT FOR TOMORROW  Did patient answer yes to any prescreen questions? NO    Patient verbalized understanding that [place] at a [facility] Pre-Procedure Testing Site is required or procedure will be postponed.   Preparation instructions have been sent via: VERBALLY

## 2019-08-25 NOTE — Telephone Encounter (Signed)
Pre procedure screening call, spoke with patient regarding having covid test today. Made patient aware that the procedure on Tuesday could be canceled because it will be longer than 72 hrs. Writer offered to have the test done here in [place] either tomorrow or Monday. Patient unsure if he will be able to drive into [place] from [place]. Patient frustrated that this the second time there has been an issue with covid testing and his procedure. States that both times was given very short notice for testing and does not have time to make arrangements. Stated that he needs to speak with his partner. Writer will give message to Dr Foye Spurling.

## 2019-08-26 ENCOUNTER — Encounter: Payer: Self-pay | Admitting: Gastroenterology

## 2019-08-28 ENCOUNTER — Encounter: Payer: Self-pay | Admitting: Gastroenterology

## 2019-08-28 ENCOUNTER — Telehealth: Payer: Self-pay

## 2019-08-28 NOTE — Anesthesia Preprocedure Evaluation (Addendum)
Anesthesia Pre-operative History and Physical for Kyle Bright    Highlighted Issues for this Procedure:  43 y.o. male presenting for ERCP with anesthesia.    Allergies:   -- Adhesive Tape -- Rash   -- Sulfa Antibiotics -- Rash    --  Rash on legs    Habitus:  - Estimated body mass index is 17.43 kg/m as calculated from the following:     Height as of 09/21/19: 1.753 m (5\' 9" ).    Weight as of 09/21/19: 53.5 kg (118 lb).    PMH:  - Primary sclerosing cholangitis  - Palpitations  - Ulcerative colitis    Anesthetic history:  1). Prior GETA for ERCP, Miller 2, no complications           .  Marland Kitchen  Anesthesia Evaluation Information Source: records, patient     ANESTHESIA HISTORY  Pertinent(-):  No History of anesthetic complications    GENERAL  Pertinent (-):  No history of anesthetic complications     PULMONARY  Pertinent(-):  No smoking, asthma or COPD    CARDIOVASCULAR  Good(4+METs) Exercise Tolerance  Pertinent(-):  No angina    GI/HEPATIC/RENAL    NPO Status  NPO    > 8hrs ago (solids) and > 2hrs ago (clears)    + Liver Disease    + Bowel Issues          ulcerative colitis NEURO/PSYCH  Pertinent(-):  No seizures or cerebrovascular event        HEMATOLOGIC  Pertinent(-):  No bruising/bleeding easily or arthritis         Physical Exam    Airway            Mouth opening: normal            Mallampati: I            TM distance (fb): >3 FB            Neck ROM: full  Dental   Normal Exam   Cardiovascular  Normal Exam         Pulmonary   Normal Exam           ________________________________________________________________________  PLAN  ASA Score  2  Anesthetic Plan general     Induction (routine IV) General Anesthesia/Sedation Maintenance Plan (inhaled agents);  Airway Manipulation (direct laryngoscopy); Airway (cuffed ETT); Line ( use current access); Monitoring (standard ASA); Positioning (prone); PONV Plan (dexamethasone and ondansetron); Pain (per surgical team); PostOp (PACU)    Informed Consent     Risks:         Risks  discussed were commensurate with the plan listed above with the following specific points: N/V, aspiration and sore throat, Damage to: eyes, nerves and teeth, awareness, unexpected serious injury and allergic Rx.    Anesthetic Consent:         Anesthetic plan (and risks as noted above) were discussed with patient    Plan also discussed with team members including:       resident    Responsible Anesthesia Attestation:  I attest that the patient or proxy understands and accepts the risks and benefits of the anesthesia plan. I also attest that I have personally performed a pre-anesthetic examination and evaluation, and prescribed the anesthetic plan for this particular location within 48 hours prior to the anesthetic as documented. Rollene Rotunda, MD  09/22/19, 10:23 AM

## 2019-08-28 NOTE — Telephone Encounter (Addendum)
Sedation Type: GA  Procedure Location: AC4  Procedure Date: 09/22/19  Arrival time: 0900  Dr.: Evern Bio    Anticoag: NA  Coag ordering provider: NA  Phone number of anticoag ordering provider: NA      COVID Testing Dates: 09/19/19  COVID Testing Site: [facility], PLEASE FAX THE STAT ORDER TO [phone]  PT IS CALLING TO MAKE HIS APPT FOR TOMORROW  Did patient answer yes to any prescreen questions? NO    Patient verbalized understanding that [place] at a [facility] Pre-Procedure Testing Site is required or procedure will be postponed.   Preparation instructions have been sent via: [place]

## 2019-08-29 ENCOUNTER — Ambulatory Visit: Payer: PRIVATE HEALTH INSURANCE | Admitting: Gastroenterology

## 2019-09-13 ENCOUNTER — Telehealth: Payer: Self-pay

## 2019-09-13 NOTE — Telephone Encounter (Signed)
Patient's girlfriend called. Patient would like to know his blood type. Is there a way to send this information through my chart or call?

## 2019-09-13 NOTE — Telephone Encounter (Signed)
Pt's girlfriend called to inquire re:  pt's blood type--called pt to advise him, no answer, left detailed message to advise him his blood type is O+.

## 2019-09-20 ENCOUNTER — Telehealth: Payer: Self-pay

## 2019-09-20 NOTE — Telephone Encounter (Signed)
Pre procedure screening call completed. Spoke with patient regarding arrival time of 0900, NPO after MN and need for a driver home,due to covid 19 one visitor is allowed to accompany you. Patient states he had covid test done at [facility] on 10/20. Patient verbalized understanding, has no questions or concerns.

## 2019-09-22 ENCOUNTER — Encounter: Payer: Self-pay | Admitting: Gastroenterology

## 2019-09-22 ENCOUNTER — Ambulatory Visit
Admission: RE | Admit: 2019-09-22 | Discharge: 2019-09-22 | Disposition: A | Discharge status: Home / Self Care | Payer: PRIVATE HEALTH INSURANCE | Source: Ambulatory Visit | Attending: Gastroenterology | Admitting: Gastroenterology

## 2019-09-22 ENCOUNTER — Ambulatory Visit: Payer: PRIVATE HEALTH INSURANCE | Admitting: Student in an Organized Health Care Education/Training Program

## 2019-09-22 ENCOUNTER — Encounter: Payer: Self-pay | Admitting: Student in an Organized Health Care Education/Training Program

## 2019-09-22 ENCOUNTER — Other Ambulatory Visit: Payer: PRIVATE HEALTH INSURANCE | Admitting: Gastroenterology

## 2019-09-22 DIAGNOSIS — K8301 Primary sclerosing cholangitis: Secondary | ICD-10-CM | POA: Insufficient documentation

## 2019-09-22 DIAGNOSIS — Z8379 Family history of other diseases of the digestive system: Secondary | ICD-10-CM | POA: Insufficient documentation

## 2019-09-22 DIAGNOSIS — Z9049 Acquired absence of other specified parts of digestive tract: Secondary | ICD-10-CM | POA: Insufficient documentation

## 2019-09-22 DIAGNOSIS — Z980 Intestinal bypass and anastomosis status: Secondary | ICD-10-CM | POA: Insufficient documentation

## 2019-09-22 DIAGNOSIS — K831 Obstruction of bile duct: Secondary | ICD-10-CM

## 2019-09-22 DIAGNOSIS — K5190 Ulcerative colitis, unspecified, without complications: Secondary | ICD-10-CM

## 2019-09-22 MED ORDER — ONDANSETRON HCL 2 MG/ML IV SOLN *I*
INTRAMUSCULAR | Status: DC | PRN
Start: 2019-09-22 — End: 2019-09-22
  Administered 2019-09-22: 4 mg via INTRAMUSCULAR

## 2019-09-22 MED ORDER — FENTANYL CITRATE 50 MCG/ML IJ SOLN *WRAPPED*
INTRAMUSCULAR | Status: DC | PRN
Start: 2019-09-22 — End: 2019-09-22
  Administered 2019-09-22 (×2): 50 ug via INTRAVENOUS

## 2019-09-22 MED ORDER — DEXAMETHASONE SODIUM PHOSPHATE 4 MG/ML INJ SOLN *WRAPPED*
INTRAMUSCULAR | Status: DC | PRN
Start: 2019-09-22 — End: 2019-09-22
  Administered 2019-09-22: 4 mg via INTRAVENOUS

## 2019-09-22 MED ORDER — SUCCINYLCHOLINE CHLORIDE 20 MG/ML IV/IJ SOLN *WRAPPED*
Status: DC | PRN
Start: 2019-09-22 — End: 2019-09-22
  Administered 2019-09-22: 60 mg via INTRAVENOUS

## 2019-09-22 MED ORDER — AMOXICILLIN-POT CLAVULANATE 500-125 MG PO TABS *I*
1.0000 | ORAL_TABLET | Freq: Two times a day (BID) | ORAL | 0 refills | Status: DC
Start: 2019-09-22 — End: 2019-12-28

## 2019-09-22 MED ORDER — PIPERACILLIN SOD-TAZOBACTAM SO 2.25 GM IV SOLR *I*
INTRAVENOUS | Status: DC | PRN
  Administered 2019-09-22: 3.375 g via INTRAVENOUS

## 2019-09-22 MED ORDER — HYDROMORPHONE HCL PF 1 MG/ML IJ SOLN *WRAPPED*
0.5000 mg | INTRAMUSCULAR | Status: DC | PRN
Start: 2019-09-22 — End: 2019-09-22

## 2019-09-22 MED ORDER — LACTATED RINGERS IV SOLN *I*
100.0000 mL/h | INTRAVENOUS | Status: DC
Start: 2019-09-22 — End: 2019-09-23

## 2019-09-22 MED ORDER — FENTANYL CITRATE 50 MCG/ML IJ SOLN *WRAPPED*
INTRAMUSCULAR | Status: AC
Start: 2019-09-22 — End: 2019-09-22
  Filled 2019-09-22: qty 2

## 2019-09-22 MED ORDER — PROPOFOL 10 MG/ML IV EMUL (INTERMITTENT DOSING) WRAPPED *I*
INTRAVENOUS | Status: DC | PRN
Start: 2019-09-22 — End: 2019-09-22
  Administered 2019-09-22: 100 mg via INTRAVENOUS
  Administered 2019-09-22: 30 mg via INTRAVENOUS

## 2019-09-22 MED ORDER — HALOPERIDOL LACTATE 5 MG/ML IJ SOLN *I*
0.5000 mg | Freq: Once | INTRAMUSCULAR | Status: DC | PRN
Start: 2019-09-22 — End: 2019-09-22

## 2019-09-22 MED ORDER — LIDOCAINE HCL 2 % IJ SOLN *I*
INTRAMUSCULAR | Status: DC | PRN
Start: 2019-09-22 — End: 2019-09-22
  Administered 2019-09-22: 40 mg via INTRAVENOUS

## 2019-09-22 NOTE — Anesthesia Case Conclusion (Signed)
CASE CONCLUSION  Emergence  Actions:  Suctioned, soft bite block and extubated  Criteria Used for Airway Removal:  Adequate Tv & RR, acceptable O2 saturation and 5 sec head lift  Assessment:  Routine  Transport  Directly to: PACU  Airway:  Nasal cannula  Oxygen Delivery:  2 lpm  Position:  Recumbent  Patient Condition on Handoff  Level of Consciousness:  Alert/talking/calm  Patient Condition:  Stable  Handoff Report to:  RN

## 2019-09-22 NOTE — Progress Notes (Signed)
Writer reviewed AVS in detail with the patient. Signature was not obtained due to current hospital recommendations regarding infection prevention practices related to the COVID-19 pandemic.The patient  was given a sealed copy of the AVS. Patient has been told about medications to pick up at [facility].       Brayton El, RN

## 2019-09-22 NOTE — Discharge Instructions (Signed)
Gastroenterology Unit  Discharge Instructions for ERCP     09/22/2019     11:13 AM    ERCP with biopsies.     Do not drive, operate heavy machinery, drink alcoholic beverages, make important personal or business decisions, or sign legal documents until the next day.      Return to your usual diet    Things you May Expect:    A mild sore throat (Warm liquids or lozenges will soothe the throat)    You were given medication to help you relax during the test.     You need to rest at home for at least 4-6 hours.    You should Call Your Doctor for Any of the Following:    Severe lightheadedness, chills, abdominal pain, black or red stools    Fever    Continuous nausea, vomiting, or bloody vomiting.    Pain or redness at IV site    If you have a serious problem after hours,   Call [phone] to reach the GI physician on call.  If you are unable to reach your doctor, go to the [facility] Emergency Department.    Follow Up Care:   Report will be sent to your primary doctor     Augmentin for 3 days.    Follow up in office    New Prescriptions    AMOXICILLIN-CLAVULANATE (AUGMENTIN) 500-125 MG PER TABLET    Take 1 tablet by mouth 2 times daily

## 2019-09-22 NOTE — Anesthesia Procedure Notes (Signed)
---------------------------------------------------------------------------------------------------------------------------------------    AIRWAY   GENERAL INFORMATION AND STAFF    Patient location during procedure: OR       Date of Procedure: 09/22/2019 10:24 AM  CONDITION PRIOR TO MANIPULATION     Current Airway/Neck Condition:  Normal        For more airway physical exam details, see Anesthesia PreOp Evaluation  AIRWAY METHOD     Patient Position:  Sniffing    Preoxygenated: yes      Induction: IV    Mask Difficulty Assessment:  1 - vent by mask      Mask NMB: 1 - vent by mask      Technique Used for Successful ETT Placement:  Direct laryngoscopy    Devices/Methods Used in Placement:  Intubating stylet    Blade Type:  Miller    Laryngoscope Blade/Video laryngoscope Blade Size:  2    Cormack-Lehane Classification:  Grade IIa - partial view of glottis    Placement Verified by: capnometry, auscultation and equal breath sounds      Number of Attempts at Approach:  2    Ventilation Between Attempts:  Bag valve mask  FINAL AIRWAY DETAILS    Final Airway Type:  Endotracheal airway    Final Endotracheal Airway:  ETT      Cuffed: cuffed    Insertion Site:  Oral    ETT Size (mm):  7.5    Distance inserted from Lips (cm):  22  ADDITIONAL COMMENTS   FirstattemptwithMiller2withoutparalytic.secondatemptwithmilleraftersuccinylcholinedelivered  ----------------------------------------------------------------------------------------------------------------------------------------

## 2019-09-22 NOTE — Procedures (Signed)
Procedure Report    Endoscopic Retrograde Cholangiopancreatography Note     Date of Procedure: 09/22/2019   Referring Physician:   No ref. provider found  Primary Care Provider: Unknown, Provider, PA   Attending Physician: Juline Patch, MD  Fellow: None    Indication: History of [place].  Biopsies in the past has shown low-grade dysplasia.      Preprocedure Evaluation: The patient was stable for sedation and endoscopy. An informed consent was obtained prior to the procedure, including risks, benefits, potential complications and alternatives of therapeutic ERCP.  Specifically, the risks of life-threatening pancreatitis, perforation, infection and emergency surgery were discussed with the patient and patient requested to proceed.    Medications:   GETA    Endoscope: Olympus [id number]-180V    Procedure Details/Findings: The patient was placed in prone position and monitored continuously with automatic blood pressure monitoring, ECG tracing, pulse oximetry, and direct observations.      Ampulla showed evidence of sphincterotomy.  CBD was cannulated with a Performa catheter.  Cholangiogram showed beading of intrahepatic ducts.  There was mild diffuse narrowing of common hepatic duct.  No definite stricture of common hepatic duct was seen.  The common bile duct was of small caliber.  A wire was positioned deep in the common bile duct.  The ampulla was dilated with a 4 mm balloon to facilitate the passage of spyglass cholangioscope through the ampulla.  Over the wire the spyglass cholangioscope was introduced into the common bile duct and advanced up to the bifurcation of common hepatic duct.  The common hepatic duct and common bile duct showed diffuse scarring.  Some friable tissue was seen over the common hepatic duct.  Multiple biopsies were taken with the biopsy forceps from the CBD and CHD.  The spyglass cholangioscope was withdrawn.  The CBD was cannulated with a cytology brush and a wire.  Brushings were obtained from the  common bile duct and common hepatic duct.  After the biopsies/brushings CBD was cannulated with a Performa catheter.  Cholangiogram was performed.  There was no extravasation of the contrast.  Patient tolerated the procedure well.            Complications: none    Estimated Blood Loss: 5 in mL    Impression:     1.  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  2.  Mild diffuse narrowing of common hepatic duct.  No definite stricture of common hepatic duct seen.  3.  Cholangioscopy shows diffuse scarring of common bile duct and common hepatic duct.  Some friable tissue was seen over the common hepatic duct.  Status post multiple biopsies.  Recommendations:   Augmentin for 3 days.   Follow up in office.     Kyle Brandstetter Foye Spurling, MD

## 2019-09-22 NOTE — Preop H&P (Signed)
OUTPATIENT PRE-PROCEDURE H&P    Chief Complaint / Indications for Procedure: [place]    Past Medical History:     Past Medical History:   Diagnosis Date    Anemia     Liver disease     Osteoporosis     Palpitations     Pouchitis     PSC (primary sclerosing cholangitis)     Ulcerative colitis      Past Surgical History:   Procedure Laterality Date    APPENDECTOMY      COLONOSCOPY      TOTAL COLECTOMY       Family History   Problem Relation Age of Onset    Crohn's disease Brother     Ulcerative colitis Brother     Colon cancer Neg Hx     Liver Disease Neg Hx      Social History     Socioeconomic History    Marital status: Single     Spouse name: Not on file    Number of children: Not on file    Years of education: Not on file    Highest education level: Not on file   Tobacco Use    Smoking status: Never Smoker    Smokeless tobacco: Never Used   Substance and Sexual Activity    Alcohol use: Not Currently    Drug use: Not Currently    Sexual activity: Not on file   Other Topics Concern    Not on file   Social History Narrative    Not on file       Allergies:    Allergies   Allergen Reactions    Adhesive Tape Rash    Sulfa Antibiotics Rash     Rash on legs        Medications:  (Not in a hospital admission)     Current Outpatient Medications   Medication    Calcium Carbonate-Simethicone (GAS-X WITH MAALOX EX ST PO)    amoxicillin-clavulanate (AUGMENTIN) 500-125 MG per tablet    mesalamine (LIALDA) 1.2 GM EC tablet    loperamide (IMODIUM) 2 MG capsule    multi-vitamin (TAB-A-VITE/BETA CAROTENE) per tablet    calcium-vitamin D (OSCAL-500) 500-200 MG-UNIT per tablet    cholecalciferol (VITAMIN D) 50 MCG (2000 UT) tablet    Probiotic Product (VSL#3) CAPS     Current Facility-Administered Medications   Medication Dose Route Frequency    Lactated Ringers Infusion  100 mL/hr Intravenous Continuous     Vitals:    09/22/19 0937   BP: 104/77   Resp: 16   Temp: 37.1 C (98.8 F)   Weight: 53.5 kg (118  lb)   Height: 175.3 cm (_0 )       ROS:  GI:See HPI.    Physical Examination:  Head/Nose/Throat:negative  Lungs:Lungs clear  Cardiovascular:normal S1 and S2  Abdomen: abdomen soft, non-tender, nondistended, normal active bowel sounds, no masses or organomegaly      Lab Results: none    Radiology impressions (last 30 days):  No results found.    Currently Active Problems:  Patient Active Problem List   Diagnosis Code    PSC (primary sclerosing cholangitis) K83.01        Impression:  PSC    Plan:  ERCP  MAC    UPDATES TO PATIENT'S CONDITION on the DAY OF SURGERY/PROCEDURE    I. Updates to Patient's Condition (to be completed by a provider privileged to complete a H&P, following reassessment of the patient by the provider):  Full H&P done today; no updates needed.    II. Procedure Readiness   I have reviewed the patient's H&P and updated condition. By completing and signing this form, I attest that this patient is ready for surgery/procedure.      III. Attestation   I have reviewed the updated information regarding the patient's condition and it is appropriate to proceed with the planned surgery/procedure.    Daiya Tamer, MD as of 9:40 AM 09/22/2019     For the extent of the COVID-19 pandemic, [facility] is avoiding physical signatures on consent forms in an effort to reduce transmission of COVID-19. I have reviewed the procedure, risks, and alternatives documented above with the patient. All questions have been answered. The patient expresses understanding and has verbally consented to the procedure and for blood transfusions in the event of an emergency during the procedure.

## 2019-09-22 NOTE — Anesthesia Postprocedure Evaluation (Signed)
Anesthesia Post-Op Note    Patient: Kyle Bright    Procedure(s) Performed:  Procedure Summary  Date:  09/22/2019 Anesthesia Start: 09/22/2019 10:08 AM Anesthesia Stop: 09/22/2019 11:33 AM Room / Location:  * No operating room entered * / [facility] GI PROCEDURES   * No procedures listed * Diagnosis:  * No pre-op diagnosis entered * * No surgeons listed * Responsible Anesthesia Provider:  Rollene Rotunda, MD         Recovery Vitals  BP: 106/70 (09/22/2019 12:00 PM)  Heart Rate: 74 (09/22/2019 12:00 PM)  Heart Rate (via Pulse Ox): 67 (09/22/2019  9:37 AM)  Resp: 16 (09/22/2019 12:00 PM)  Temp: 36.2 C (97.2 F) (09/22/2019 11:30 AM)  SpO2: 97 % (09/22/2019 12:00 PM)  O2 Flow Rate: 4 L/min (09/22/2019 11:30 AM)   0-10 Scale: 0 (09/22/2019 12:00 PM)    Anesthesia type:  general  Complications Noted During Procedure or in PACU:  None   Comment:    Patient Location:  PACU  Level of Consciousness:    Recovered to baseline, awake, alert and oriented  Patient Participation:     Able to participate  Temperature Status:    Normothermic  Oxygen Saturation:    Within patient's normal range  Cardiac Status:   Within patient's normal range  Fluid Status:    Stable  Airway Patency:     Yes  Pulmonary Status:    Baseline  Pain Management:    Adequate analgesia  Nausea and Vomiting:  None    Post Op Assessment:    Tolerated procedure well and no evidence of recallAttending Attestation:  All indicated post anesthesia care provided       -

## 2019-09-29 LAB — SURGICAL PATHOLOGY

## 2019-09-29 LAB — MEDICAL CYTOLOGY

## 2019-10-04 LAB — SURGICAL PATHOLOGY

## 2019-10-09 ENCOUNTER — Telehealth: Payer: Self-pay

## 2019-10-09 NOTE — Telephone Encounter (Signed)
Received call from pt who requested update re:  listing status s/p ERCP (as per Selection Committee, pt is listable pending bx results); advised pt bx will be reviewed at this week's Tumor Board after which I'll call him with update.  Pt verbalized understanding.      Pt also needs dental clearance prior to listing; pt reported having ongoing dental appointments--local dentist recommended removal of one wisdom tooth, however, pt plans to have all four removed; dentist also recommended fillings and crowns.  Pt requested general anesthesia for removal of wisdom teeth--he inquired if there are any contraindications to receiving general anesthesia.  Advised pt I would review inquiry with provider and update him.  Pt appreciative.

## 2019-10-10 ENCOUNTER — Telehealth: Payer: Self-pay

## 2019-10-10 NOTE — Telephone Encounter (Signed)
Called pt to request that he obtain updated labs (last labs were in July), also to advise pt that general anesthesia is okay for dental work.  Pt verbalized understanding.

## 2019-10-13 ENCOUNTER — Telehealth: Payer: Self-pay

## 2019-10-13 ENCOUNTER — Encounter: Payer: Self-pay | Admitting: Gastroenterology

## 2019-10-13 NOTE — Progress Notes (Signed)
Liver tumor board:  10/12/2019    Attending Radiologist:  Dr. Lyndel Safe    Initial Presentation? 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  Please review biopsy results obtained at time of ERCP; pt is being considered for LDLT, want to rule out potential cholangiocarcinoma.  7/21 tumor markers:  AFP 3, Ca 19-9 134, CEA 3.0.      Tumor Board Discussion:  7/21 CT showed scattered areas of ductal dilatation, no mass.       Pathology:  Biopsy revealed benign biliary mucosa with inflammation; cytologic features are also benign--nothing suspicious noted on biopsy.        Plan and follow up:  Discuss listing status at Selection Committee as plan of care.

## 2019-10-13 NOTE — Telephone Encounter (Signed)
Took call from patient's wife stating that they were going to go to the lab, but needed orders to be faxed. Writer faxed standing order to [phone].

## 2019-10-13 NOTE — Telephone Encounter (Signed)
Received call from lab re: critical results - alk phos 1,008 and GGT of 547.  Rest of LFTs include TB 1.0, ALT/AST 248/190.  Upon chart review, patient has had worsening of his AP/GGT and has [place].  Rest of LFTs at his baseline.  Called to ensure no signs of cholangitis - no answer, LVM for patient to call back if having any symptoms.

## 2019-10-13 NOTE — Telephone Encounter (Signed)
Called pt re:  outcome of yesterday's Tumor Board which included that ERCP brushings showed nothing concerning for malignancy; and today's Selection Committee included that team (Dr. Jerelyn Charles and Dr. Vickii Penna) would like to meet with pt to discuss recommendations re:  Transplantation.  Pt requested Zoom meeting vs in clinic meeting at this time d/t currently having car problems.

## 2019-10-16 DIAGNOSIS — K8301 Primary sclerosing cholangitis: Secondary | ICD-10-CM

## 2019-10-16 LAB — COMPREHENSIVE METABOLIC PANEL
ALT: 248 U/L
AST: 190 U/L
Albumin: 4.3 g/dL
Alk Phos: 1008 U/L
Anion Gap: 9
Bilirubin,Total: 1 mg/dL
CO2: 31 mmol/L
Calcium: 9.9 mg/dL
Chloride: 99 mmol/L
Creatinine: 0.8 mg/dL
GFR,Black: 128 *
GFR,Caucasian: 105 *
Glucose: 86 mg/dL
Lab: 15 mg/dL
Potassium: 4.2 mmol/L
Sodium: 139 mmol/L
Total Protein: 7.2 g/dL

## 2019-10-16 LAB — PROTIME-INR: INR: 1

## 2019-10-17 ENCOUNTER — Telehealth: Payer: Self-pay

## 2019-10-17 NOTE — Telephone Encounter (Signed)
Please call patient's sister [name] - re: update -3 months- review-possible televisit

## 2019-10-19 ENCOUNTER — Telehealth: Payer: Self-pay

## 2019-10-19 NOTE — Telephone Encounter (Signed)
Returned call to pt's SO, Kyle Bright, who inquired about appts with Dr. Jerelyn Charles and Dr. Francoise Schaumann pt missed appointments.  Advised SO that appts are being scheduled via Zoom, they will be notified once appts have been scheduled.  SO verbalized understanding and appreciation.

## 2019-10-23 NOTE — Telephone Encounter (Signed)
Patient confirmed Zoom appointment with Dr. Vickii Penna and Dr. Jerelyn Charles on 11/07/19 @ 1:30pm.

## 2019-10-30 ENCOUNTER — Telehealth: Payer: Self-pay

## 2019-10-30 NOTE — Telephone Encounter (Signed)
Patient called would like a call back he has some symptoms he wants to discuss with his coordinator

## 2019-10-30 NOTE — Telephone Encounter (Signed)
Called patient. Message left.

## 2019-10-30 NOTE — Telephone Encounter (Signed)
Patient experiencing new episode of pain; started 2 weeks ago with mild  upper rt quadrant pain  alternates with diffuse achy pain in  RUQ and back. approx 2-3 pain scale, no fever, good appetite, no N/V . Takes tylenol with relief. No trauma. Appt with Dr Vickii Penna on Dec 8th.

## 2019-10-30 NOTE — Telephone Encounter (Signed)
Returned patient call, LVM to call me back

## 2019-10-31 ENCOUNTER — Encounter: Payer: Self-pay | Admitting: Urology

## 2019-11-01 ENCOUNTER — Other Ambulatory Visit: Payer: Self-pay | Admitting: Urology

## 2019-11-01 DIAGNOSIS — K7460 Unspecified cirrhosis of liver: Secondary | ICD-10-CM

## 2019-11-01 DIAGNOSIS — R109 Unspecified abdominal pain: Secondary | ICD-10-CM

## 2019-11-02 ENCOUNTER — Encounter: Payer: Self-pay | Admitting: Gastroenterology

## 2019-11-02 ENCOUNTER — Telehealth: Payer: Self-pay

## 2019-11-02 NOTE — Telephone Encounter (Addendum)
RECEIVED WEB PAGE:    Called and spoke with Lattie Haw at lab in [place]. She said patient had 3 critical lab values as follows:   ALK PHOS 1098  ALT 306  GGT 703  She will fax results to both main fax and in office fax. Writer notified M.Collichio PA and was able to give fax of lab results to him to review.

## 2019-11-02 NOTE — Telephone Encounter (Signed)
Copied from [place]. Topic: Access to Care - Labs/Orders/Imaging  >> Nov 02, 2019  2:17 PM Tashya Alberty, Larena Glassman wrote:  Lattie Haw from [place] lab is calling with crital value lab results. Lattie Haw can be reached at [phone] option 2    Writer called back line spoke with Christy Sartorius and Programme researcher, broadcasting/film/video

## 2019-11-03 ENCOUNTER — Telehealth: Payer: Self-pay

## 2019-11-03 DIAGNOSIS — K5190 Ulcerative colitis, unspecified, without complications: Secondary | ICD-10-CM

## 2019-11-03 DIAGNOSIS — K8301 Primary sclerosing cholangitis: Secondary | ICD-10-CM

## 2019-11-03 LAB — CBC
Hematocrit: 51.2 %
Hemoglobin: 17 g/dL
MCV: 92.2 fL
Platelets: 236 10*3/uL
RBC: 5.56 MIL/uL
RDW: 13.3 %
WBC: 5.23 10*3/uL

## 2019-11-03 LAB — COMPREHENSIVE METABOLIC PANEL
ALT: 306 U/L
AST: 218 U/L
Albumin: 4.6 g/dL
Alk Phos: 1098 U/L
Anion Gap: 10
Bilirubin,Total: 1.9 mg/dL
CO2: 31 mmol/L
Calcium: 10.1 mg/dL
Chloride: 97 mmol/L
Creatinine: 0.8 mg/dL
GFR,Black: 128 *
GFR,Caucasian: 105 *
Glucose: 91 mg/dL
Lab: 5 mg/dL
Potassium: 4.2 mmol/L
Sodium: 138 mmol/L
Total Protein: 7.6 g/dL

## 2019-11-03 LAB — PROTIME-INR: INR: 1

## 2019-11-03 NOTE — Telephone Encounter (Signed)
Returned call to pt to review lab results--per Brand Males, NP, liver enzymes are elevated, however, not worse than before.  Per Dr. Jerelyn Charles, keep fuv as scheduled next week (12/8 via Zoom--confirmed that Zoom is okay) to assess abd pain and discuss likely need for ERCP.  Dr. Foye Spurling expressed concerns re:  being able to schedule ERCP in a timely manner.      Per pt, last local ERCP was with Dr. Carloyn Manner at [facility] ~06/2018.  Reviewed with NP who advised following up with Dr. Carloyn Manner to determine his availability--will then determine when and where to schedule ERCP.  Advised pt re:  Plan; pt verbalized understanding, agreed with plan.

## 2019-11-03 NOTE — Telephone Encounter (Signed)
Received lab results and Darden Dates is going to urgent scan in 5 minutes.

## 2019-11-03 NOTE — Telephone Encounter (Signed)
Patient called would like a call back to go over his labs results

## 2019-11-07 ENCOUNTER — Ambulatory Visit: Payer: PRIVATE HEALTH INSURANCE | Admitting: Transplant Surgery Liver and Kidney

## 2019-11-07 ENCOUNTER — Telehealth: Payer: Self-pay

## 2019-11-07 ENCOUNTER — Telehealth: Payer: PRIVATE HEALTH INSURANCE | Admitting: Transplant Surgery Liver and Kidney

## 2019-11-07 DIAGNOSIS — K7290 Hepatic failure, unspecified without coma: Secondary | ICD-10-CM

## 2019-11-07 DIAGNOSIS — K7460 Unspecified cirrhosis of liver: Secondary | ICD-10-CM

## 2019-11-07 DIAGNOSIS — K7210 Chronic hepatic failure without coma: Secondary | ICD-10-CM

## 2019-11-07 DIAGNOSIS — K8301 Primary sclerosing cholangitis: Secondary | ICD-10-CM

## 2019-11-07 NOTE — Progress Notes (Signed)
General H&P/Progress NOTE    Unknown, Provider, PA    [address] / [place] [address]    No referring provider defined for this encounter.    Chief Complaint: [place]    History of Present Illness: Kyle Bright is a 43 y.o. 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 Patient underwent spyglass for tissue biopsy. Biopsy result did not reveal cancer. The two specimens were reviewed at tumor board. It appears that the first biopsy had a significant amount of inflammation that mimic low grade dysplasia.   During the video visit, patient stated that has mild abdominal pain and his ALK is increasing. Patient was informed that he needs an ERCP to be arranged locally. His last ERCP by Dr. Foye Spurling showed  intrahepatic ducts PSC and    Mild diffuse narrowing of common hepatic duct.  No definite stricture of common hepatic duct seen.    Past Medical History:   Diagnosis Date    Anemia     Liver disease     Osteoporosis     Palpitations     Pouchitis     PSC (primary sclerosing cholangitis)     Ulcerative colitis      Past Surgical History:   Procedure Laterality Date    APPENDECTOMY      COLONOSCOPY      TOTAL COLECTOMY       Family History   Problem Relation Age of Onset    Crohn's disease Brother     Ulcerative colitis Brother     Colon cancer Neg Hx     Liver Disease Neg Hx      Social History     Socioeconomic History    Marital status: Single     Spouse name: Not on file    Number of children: Not on file    Years of education: Not on file    Highest education level: Not on file   Tobacco Use    Smoking status: Never Smoker    Smokeless tobacco: Never Used   Substance and Sexual Activity    Alcohol use: Not Currently    Drug use: Not Currently    Sexual activity: Not on file   Other Topics Concern    Not on file   Social History Narrative    Not on file       Allergies:   Allergies   Allergen Reactions    Adhesive Tape  Rash    Sulfa Antibiotics Rash     Rash on legs        (Not in a hospital admission)     Current Outpatient Medications   Medication    amoxicillin-clavulanate (AUGMENTIN) 500-125 MG per tablet    Calcium Carbonate-Simethicone (GAS-X WITH MAALOX EX ST PO)    amoxicillin-clavulanate (AUGMENTIN) 500-125 MG per tablet    mesalamine (LIALDA) 1.2 GM EC tablet    loperamide (IMODIUM) 2 MG capsule    multi-vitamin (TAB-A-VITE/BETA CAROTENE) per tablet    calcium-vitamin D (OSCAL-500) 500-200 MG-UNIT per tablet    cholecalciferol (VITAMIN D) 50 MCG (2000 UT) tablet    Probiotic Product (VSL#3) CAPS     No current facility-administered medications for this visit.        Review of Systems:   ROS         Physical Examination:   There were no vitals taken for this visit.  Physical Exam  Lab Results:       Lab results: 11/02/19  1238   WBC 5.23   Hemoglobin 17.0   Hematocrit 51.20   RBC 5.56   Platelets 236           Lab results: 11/02/19  1238   Sodium 138   Potassium 4.2   Chloride 97   CO2 31   UN 5   Creatinine 0.80   GFR,Caucasian 105   GFR,Black 128   Glucose 91   Calcium 10.1   Total Protein 7.6   Albumin 4.6   ALT 306   AST 218   Alk Phos 1,098   Bilirubin,Total 1.9           Lab results: 11/02/19  1238   INR 1.0       No results found for: AFP2    Radiology impressions (last 3 days):  No results found.          Assessment: Kyle Bright is a 43 y.o. 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 Patient underwent spyglass for tissue biopsy. Biopsy result did not reveal cancer. The two specimens were reviewed at tumor board. It appears that the first biopsy had a significant amount of inflammation that mimic low grade dysplasia.   Due to his current symptom and an increase ALK, patient was advised to contact his local gastroenterologist for ERCP. I am going to arrange for a follow-up in 41-monthto evaluate his symptoms       This  consult was performed via zoom due to [place]      Thank you for referring the patient and please don't hesitate to contact me if you have any questions or concerns     BGaynelle Adu MBBS   BReymundo Poll MBBS, FDalbert Batman MSc   Transplant Hepatology     [facility]  Note created: 11/07/2019  at: 1:57 PM

## 2019-11-07 NOTE — Telephone Encounter (Signed)
Took call from patient requesting call back from Tami to discuss his plan of care. Please call him at [phone].

## 2019-11-09 ENCOUNTER — Telehealth: Payer: Self-pay

## 2019-11-09 NOTE — Telephone Encounter (Signed)
Returned call to pt, no answer, LVM with call back number.

## 2019-11-10 NOTE — Telephone Encounter (Signed)
Patient returned your call and can be reached at [phone].

## 2019-11-17 ENCOUNTER — Telehealth: Payer: Self-pay

## 2019-11-17 NOTE — Telephone Encounter (Signed)
Per previous recommendation, pt needs ERCP scheduled--plan was to confer with Dr. Carloyn Manner, local provider, to request that ERCP be scheduled locally if he is able to do so sooner than able to schedule at [facility].     Called Dr. Lilli Few Roy's office ([phone]) to inquire about scheduling; no answer, LVM with call back number.

## 2019-11-21 ENCOUNTER — Telehealth: Payer: Self-pay

## 2019-11-21 NOTE — Telephone Encounter (Signed)
Took call from patient requesting call back from [place]. Asking about status of scheduling ERCP in [place]. Please call him at [phone].

## 2019-11-28 ENCOUNTER — Telehealth: Payer: Self-pay

## 2019-11-28 NOTE — Telephone Encounter (Signed)
Kyle Bright from Dr. Carloyn Manner office called regarding. They are sending this to Dr. Fay Records to do ERCP. Per Kyle Bright due to [place] they won't be able to do this since they are only doing elective cases.

## 2019-11-29 ENCOUNTER — Telehealth: Payer: Self-pay

## 2019-11-29 NOTE — Telephone Encounter (Addendum)
Kyle Bright patients partner called stating that there local GI office in  [place] with Dr. Fay Records has an appt available for patients ERCP next week

## 2019-12-01 ENCOUNTER — Encounter: Payer: Self-pay | Admitting: Gastroenterology

## 2019-12-04 ENCOUNTER — Inpatient Hospital Stay: Admit: 2019-12-04 | Discharge: 2019-12-04 | Disposition: A | Discharge status: Home / Self Care | Payer: Self-pay

## 2019-12-04 ENCOUNTER — Telehealth: Payer: Self-pay

## 2019-12-04 ENCOUNTER — Encounter: Payer: Self-pay | Admitting: Gastroenterology

## 2019-12-04 NOTE — Telephone Encounter (Signed)
Called pt for update re:  ERCP--per previous messages, Dr. Rex Kras office unable to do ERCP d/t Covid, however, Dr. Fay Records possibly able to do ERCP; no answer, LVM with call back number.

## 2019-12-05 ENCOUNTER — Encounter: Payer: Self-pay | Admitting: Gastroenterology

## 2019-12-05 ENCOUNTER — Telehealth: Payer: Self-pay

## 2019-12-05 NOTE — Telephone Encounter (Signed)
Took call from patient's partner, [name], stating that patient has ERCP with Dr. Fay Records on 12-04-2019. Requesting call back from [place] at either [phone] or [phone].

## 2019-12-07 ENCOUNTER — Telehealth: Payer: Self-pay

## 2019-12-07 NOTE — Telephone Encounter (Signed)
Received call from Dr. Fay Records who reported he had completed ERCP, requested to speak with Dr. Vickii Penna re:  findings/results; provided Dr. Hillary Bow contact number after discussing with Dr. Katina Dung will call Dr. Vickii Penna.

## 2019-12-11 ENCOUNTER — Encounter: Payer: Self-pay | Admitting: Gastroenterology

## 2019-12-11 ENCOUNTER — Ambulatory Visit
Admit: 2019-12-11 | Discharge: 2019-12-11 | Disposition: A | Discharge status: Home / Self Care | Payer: PRIVATE HEALTH INSURANCE

## 2019-12-11 ENCOUNTER — Telehealth: Payer: Self-pay

## 2019-12-11 ENCOUNTER — Other Ambulatory Visit: Payer: Self-pay | Admitting: Gastroenterology

## 2019-12-11 NOTE — Telephone Encounter (Signed)
Call from patients partner [name], said patient saw local GI (Dr Reece Levy) today & was advised to limit tylenol to 2 gm's daily - said he had been taking 2 gm's daily since ~November. Recently having more abdominal discomfort and has increased to 4 gms daily.  She reports that patient has already has taken 3 gms tylenol today & was instructed to go to local ED if needing additional pain med by Dr Reece Levy.  She wanted to know if [facility] team has any alternative recommendations.  Discussed that if another provider has given them instructions, they should follow their advice for tonight.  Will copy primary team for f/u tomorrow if any other input/suggestions.  [name] appreciated call back

## 2019-12-12 ENCOUNTER — Other Ambulatory Visit: Payer: Self-pay

## 2019-12-12 ENCOUNTER — Ambulatory Visit
Admit: 2019-12-12 | Discharge: 2019-12-12 | Disposition: A | Discharge status: Home / Self Care | Payer: PRIVATE HEALTH INSURANCE

## 2019-12-12 ENCOUNTER — Other Ambulatory Visit: Payer: Self-pay | Admitting: Gastroenterology

## 2019-12-12 ENCOUNTER — Telehealth: Payer: Self-pay

## 2019-12-12 NOTE — Telephone Encounter (Signed)
At Dr Al-Judaibi's request called for updated information re_ ED, s/p recent ERCP went to ED with Abdominal pain and jaundice. Work up revealed PE and DVT. They are completing MRCP and will discuss results with Dr Jerelyn Charles.

## 2019-12-12 NOTE — Telephone Encounter (Signed)
Call from [place] in [place] ED [phone]) patient there for f/u from previous call. Had ERCP a week ago & patient is jaundiced w/inc LFT's (tb 12.5, alk 1076, ast 127, alt 121, inr 2.1, sodium 135.  Said he spoke with Dr Merton Border (on call for GI) & want to know if he should be admitted there or transferred to [facility].  D/w Dr Durwin Nora Maximiano Coss & recommend that they exchange the ERCP locally - can call our office if any ongoing issues or concerns

## 2019-12-12 NOTE — Telephone Encounter (Signed)
RECD CD OF ERCP AND UPLOADED TO [facility]

## 2019-12-12 NOTE — Progress Notes (Signed)
Patient has been admitted to reg floor [phone] spoke to [facility] NP , she faxed over records and will be scanned. Patient on regular floor, patient had repeat ERCP 1 week ago w CBD stent placement. Patient had worsening abd pain and went to ED, has had rt  calf pain and swelling 1-2 days. doppler showed non occlusive thrombus of rt popliteal and peroneal veins Chest CT showed RLL PE. Patient having MRCP and U/s and results will be faxed. Provided Dr Rolly Pancake phone number for consultation

## 2019-12-13 ENCOUNTER — Telehealth: Payer: Self-pay

## 2019-12-13 NOTE — Telephone Encounter (Signed)
Dr. Jerelyn Charles accepted pt for transfer to [facility]; called [facility] for update, spoke to [place], charge nurse, who reported pt condition is stable, unchanged from yesterday--pt remains on a regular floor, on Heparin gtt for DVT and PE, alert and oriented x 3.  Labs include:  Na 138, Creat 0.64, tbili 15.1, INR 2.4--->MELD 26, other labs include Alk Phos 1039, AST 127, ALT 118.  5 hour rapid Covid test sent this am a 0930, results pending.     Called [place], NP [phone]) to obtain provider information; transferring physician is Dr. Bess Harvest , floor number is [phone].     [employer], spoke to [place], advised her re:  acceptance for inpatient transfer.    Called pt, updated him re:  Plan for transfer; pt verbalized understanding--inquired about plan, how long he would be here, etc.  Advised pt that will be determined once he's here and assessed.

## 2019-12-15 ENCOUNTER — Telehealth: Payer: Self-pay

## 2019-12-15 NOTE — Telephone Encounter (Signed)
Kyle Bright would like call back at [phone]. Had questions about procedure scheduled and wanted to discuss with coordinator.

## 2019-12-15 NOTE — Telephone Encounter (Signed)
[facility] [phone]) for update, spoke to [place], nurse caring for pt who reported pt had just returned from having biliary stent removed, reported everything went well; Heparin gtt has been resumed at 1000 units/hr, Covid negative on 1/13, labs include:  Na 141, Creat 0.58, tbili 14.5 (direct bili 11.9), INR 1.64--->MELD 22, Alk Phos 856, AST 110, ALT 102.  Advised nurse there are still no beds available for transfer, as per Izora Gala in [place], however, pt is a priority for transfer.     Returned call to pt's SO, [name], who wanted to be sure txp team has been getting updates re:  pt's current status; advised SO we are getting updates as Dr. Jerelyn Charles has been in daily contact with local provider.  SO verbalized understanding and appreciation.     Addendum:  Per Dr. Jerelyn Charles, pt may be discharged home soon--will scheduled fuv in pre-txp clinic; will also obtain MRI disc and report from [facility] for review at Tumor Board.

## 2019-12-18 ENCOUNTER — Ambulatory Visit
Admit: 2019-12-18 | Discharge: 2019-12-18 | Disposition: A | Discharge status: Home / Self Care | Payer: PRIVATE HEALTH INSURANCE

## 2019-12-18 ENCOUNTER — Other Ambulatory Visit: Payer: Self-pay | Admitting: Gastroenterology

## 2019-12-19 ENCOUNTER — Inpatient Hospital Stay
Admission: AD | Admit: 2019-12-19 | Discharge: 2019-12-19 | Disposition: A | Discharge status: Home / Self Care | Payer: PRIVATE HEALTH INSURANCE | Source: Other Acute Inpatient Hospital | Admitting: Gastroenterology

## 2019-12-19 ENCOUNTER — Encounter: Payer: Self-pay | Admitting: Gastroenterology

## 2019-12-19 NOTE — Progress Notes (Signed)
Call for update on patient, spoke to [facility], he is going for a paracentesis and hida scan today TB 14.3, INT 1.67, r 0.49. Na 139 MELD 22, on IV Zosyn, pain scale 4, afebrile. On lovenox for PE and DVT. D/w Dr Leota Jacobsen. Will keep on transfer list. Catalina Lunger in transfer center updated

## 2019-12-20 ENCOUNTER — Inpatient Hospital Stay
Admission: AD | Admit: 2019-12-20 | Discharge: 2020-01-05 | DRG: 444 | Disposition: A | Discharge status: Hospice | Payer: PRIVATE HEALTH INSURANCE | Source: Other Acute Inpatient Hospital | Attending: Pain Medicine | Admitting: Pain Medicine

## 2019-12-20 ENCOUNTER — Inpatient Hospital Stay
Admission: AD | Admit: 2019-12-20 | Discharge: 2019-12-20 | Disposition: A | Discharge status: Home / Self Care | Payer: PRIVATE HEALTH INSURANCE | Source: Other Acute Inpatient Hospital | Admitting: Gastroenterology

## 2019-12-20 DIAGNOSIS — I959 Hypotension, unspecified: Secondary | ICD-10-CM | POA: Diagnosis not present

## 2019-12-20 DIAGNOSIS — K743 Primary biliary cirrhosis: Secondary | ICD-10-CM | POA: Diagnosis present

## 2019-12-20 DIAGNOSIS — I82432 Acute embolism and thrombosis of left popliteal vein: Secondary | ICD-10-CM | POA: Diagnosis present

## 2019-12-20 DIAGNOSIS — I82452 Acute embolism and thrombosis of left peroneal vein: Secondary | ICD-10-CM | POA: Diagnosis present

## 2019-12-20 DIAGNOSIS — Z20822 Contact with and (suspected) exposure to covid-19: Secondary | ICD-10-CM | POA: Diagnosis present

## 2019-12-20 DIAGNOSIS — K259 Gastric ulcer, unspecified as acute or chronic, without hemorrhage or perforation: Secondary | ICD-10-CM | POA: Diagnosis present

## 2019-12-20 DIAGNOSIS — K769 Liver disease, unspecified: Secondary | ICD-10-CM

## 2019-12-20 DIAGNOSIS — K831 Obstruction of bile duct: Secondary | ICD-10-CM | POA: Diagnosis present

## 2019-12-20 DIAGNOSIS — Z681 Body mass index (BMI) 19 or less, adult: Secondary | ICD-10-CM

## 2019-12-20 DIAGNOSIS — Y838 Other surgical procedures as the cause of abnormal reaction of the patient, or of later complication, without mention of misadventure at the time of the procedure: Secondary | ICD-10-CM | POA: Diagnosis present

## 2019-12-20 DIAGNOSIS — R64 Cachexia: Secondary | ICD-10-CM | POA: Diagnosis present

## 2019-12-20 DIAGNOSIS — M7120 Synovial cyst of popliteal space [Baker], unspecified knee: Secondary | ICD-10-CM | POA: Diagnosis present

## 2019-12-20 DIAGNOSIS — K8301 Primary sclerosing cholangitis: Principal | ICD-10-CM | POA: Diagnosis present

## 2019-12-20 DIAGNOSIS — D62 Acute posthemorrhagic anemia: Secondary | ICD-10-CM | POA: Diagnosis present

## 2019-12-20 DIAGNOSIS — Z66 Do not resuscitate: Secondary | ICD-10-CM | POA: Diagnosis present

## 2019-12-20 DIAGNOSIS — Z515 Encounter for palliative care: Secondary | ICD-10-CM | POA: Diagnosis present

## 2019-12-20 DIAGNOSIS — E559 Vitamin D deficiency, unspecified: Secondary | ICD-10-CM | POA: Diagnosis present

## 2019-12-20 DIAGNOSIS — K909 Intestinal malabsorption, unspecified: Secondary | ICD-10-CM | POA: Diagnosis present

## 2019-12-20 DIAGNOSIS — K91850 Pouchitis: Secondary | ICD-10-CM | POA: Diagnosis present

## 2019-12-20 DIAGNOSIS — K7290 Hepatic failure, unspecified without coma: Secondary | ICD-10-CM | POA: Diagnosis present

## 2019-12-20 DIAGNOSIS — K567 Ileus, unspecified: Secondary | ICD-10-CM | POA: Diagnosis present

## 2019-12-20 DIAGNOSIS — R002 Palpitations: Secondary | ICD-10-CM | POA: Diagnosis present

## 2019-12-20 DIAGNOSIS — J90 Pleural effusion, not elsewhere classified: Secondary | ICD-10-CM | POA: Diagnosis present

## 2019-12-20 DIAGNOSIS — I2699 Other pulmonary embolism without acute cor pulmonale: Secondary | ICD-10-CM | POA: Diagnosis present

## 2019-12-20 DIAGNOSIS — F329 Major depressive disorder, single episode, unspecified: Secondary | ICD-10-CM | POA: Diagnosis present

## 2019-12-20 DIAGNOSIS — F419 Anxiety disorder, unspecified: Secondary | ICD-10-CM | POA: Diagnosis present

## 2019-12-20 DIAGNOSIS — E440 Moderate protein-calorie malnutrition: Secondary | ICD-10-CM | POA: Diagnosis present

## 2019-12-20 DIAGNOSIS — K5190 Ulcerative colitis, unspecified, without complications: Secondary | ICD-10-CM | POA: Diagnosis present

## 2019-12-20 DIAGNOSIS — R188 Other ascites: Secondary | ICD-10-CM | POA: Diagnosis present

## 2019-12-20 DIAGNOSIS — K625 Hemorrhage of anus and rectum: Secondary | ICD-10-CM | POA: Diagnosis present

## 2019-12-20 DIAGNOSIS — I82442 Acute embolism and thrombosis of left tibial vein: Secondary | ICD-10-CM | POA: Diagnosis present

## 2019-12-20 DIAGNOSIS — C221 Intrahepatic bile duct carcinoma: Secondary | ICD-10-CM | POA: Diagnosis present

## 2019-12-20 LAB — PROTIME-INR
INR: 1.9 — ABNORMAL HIGH (ref 0.9–1.1)
Protime: 21.4 s — ABNORMAL HIGH (ref 10.0–12.9)

## 2019-12-20 LAB — COMPREHENSIVE METABOLIC PANEL
ALT: 74 U/L — ABNORMAL HIGH (ref 0–50)
AST: 107 U/L — ABNORMAL HIGH (ref 0–50)
Albumin: 3.1 g/dL — ABNORMAL LOW (ref 3.5–5.2)
Alk Phos: 616 U/L — ABNORMAL HIGH (ref 40–130)
Anion Gap: 13 (ref 7–16)
Bilirubin,Total: 18.8 mg/dL — ABNORMAL HIGH (ref 0.0–1.2)
CO2: 23 mmol/L (ref 20–28)
Calcium: 8.8 mg/dL — ABNORMAL LOW (ref 9.0–10.3)
Chloride: 98 mmol/L (ref 96–108)
Creatinine: 0.72 mg/dL (ref 0.67–1.17)
GFR,Black: 132 *
GFR,Caucasian: 114 *
Glucose: 97 mg/dL (ref 60–99)
Lab: 10 mg/dL (ref 6–20)
Potassium: 3.7 mmol/L (ref 3.3–5.1)
Sodium: 134 mmol/L (ref 133–145)
Total Protein: 6 g/dL — ABNORMAL LOW (ref 6.3–7.7)

## 2019-12-20 LAB — CBC AND DIFFERENTIAL
Baso # K/uL: 0 10*3/uL (ref 0.0–0.1)
Basophil %: 0.4 %
Eos # K/uL: 0.2 10*3/uL (ref 0.0–0.5)
Eosinophil %: 2.7 %
Hematocrit: 33 % — ABNORMAL LOW (ref 40–51)
Hemoglobin: 11.2 g/dL — ABNORMAL LOW (ref 13.7–17.5)
IMM Granulocytes #: 0 10*3/uL (ref 0.0–0.0)
IMM Granulocytes: 0.3 %
Lymph # K/uL: 0.9 10*3/uL — ABNORMAL LOW (ref 1.3–3.6)
Lymphocyte %: 10.9 %
MCH: 30 pg/cell (ref 26–32)
MCHC: 34 g/dL (ref 32–37)
MCV: 89 fL (ref 79–92)
Mono # K/uL: 1.1 10*3/uL — ABNORMAL HIGH (ref 0.3–0.8)
Monocyte %: 14.2 %
Neut # K/uL: 5.6 10*3/uL — ABNORMAL HIGH (ref 1.8–5.4)
Nucl RBC # K/uL: 0 10*3/uL (ref 0.0–0.0)
Nucl RBC %: 0 /100 WBC (ref 0.0–0.2)
Platelets: 428 10*3/uL — ABNORMAL HIGH (ref 150–330)
RBC: 3.7 MIL/uL — ABNORMAL LOW (ref 4.6–6.1)
RDW: 17.4 % — ABNORMAL HIGH (ref 11.6–14.4)
Seg Neut %: 71.5 %
WBC: 7.8 10*3/uL (ref 4.2–9.1)

## 2019-12-20 LAB — PHOSPHORUS: Phosphorus: 2.9 mg/dL (ref 2.7–4.5)

## 2019-12-20 LAB — NT-PRO BNP: NT-pro BNP: 90 pg/mL (ref 0–450)

## 2019-12-20 LAB — APTT: aPTT: 78.6 s — ABNORMAL HIGH (ref 25.8–37.9)

## 2019-12-20 LAB — MAGNESIUM: Magnesium: 2.1 mg/dL (ref 1.6–2.5)

## 2019-12-20 MED ORDER — TRAMADOL HCL 50 MG PO TABS *I*
50.0000 mg | ORAL_TABLET | Freq: Two times a day (BID) | ORAL | Status: DC | PRN
Start: 2019-12-20 — End: 2019-12-21
  Administered 2019-12-20: 50 mg via ORAL
  Filled 2019-12-20: qty 1

## 2019-12-20 MED ORDER — MORPHINE SULFATE 2 MG/ML IV SOLN *WRAPPED*
2.0000 mg | Status: DC | PRN
Start: 2019-12-20 — End: 2019-12-21
  Administered 2019-12-20: 2 mg via INTRAVENOUS
  Filled 2019-12-20: qty 1

## 2019-12-20 MED ORDER — DEXTROSE 5 % FLUSH FOR PUMPS *I*
0.0000 mL/h | INTRAVENOUS | Status: DC | PRN
Start: 2019-12-20 — End: 2020-01-01

## 2019-12-20 MED ORDER — PIPERACILLIN/TAZOBACTAM 4.5 G IN NS MINI-BAG PLUS 110 ML *I*
4.5000 g | Freq: Three times a day (TID) | INTRAVENOUS | Status: DC
Start: 2019-12-20 — End: 2019-12-25
  Administered 2019-12-20 – 2019-12-25 (×14): 4.5 g via INTRAVENOUS
  Filled 2019-12-20 (×12): qty 4.5
  Filled 2019-12-20 (×2): qty 110
  Filled 2019-12-20: qty 4.5
  Filled 2019-12-20: qty 110
  Filled 2019-12-20: qty 4.5

## 2019-12-20 MED ORDER — HYOSCYAMINE SULFATE CR 0.375 MG PO TB12 *I*
375.0000 ug | ORAL_TABLET | Freq: Two times a day (BID) | ORAL | Status: DC | PRN
Start: 2019-12-20 — End: 2019-12-24
  Administered 2019-12-21 (×2): 375 ug via ORAL
  Filled 2019-12-20 (×6): qty 1

## 2019-12-20 MED ORDER — SODIUM CHLORIDE 0.9 % FLUSH FOR PUMPS *I*
0.0000 mL/h | INTRAVENOUS | Status: DC | PRN
Start: 2019-12-20 — End: 2020-01-01

## 2019-12-20 MED ORDER — HEPARIN INFUSION 100 UNITS/ML (BOLUS FROM BAG) - APTT CALCULATOR *I*
0.0000 [IU] | INTRAVENOUS | Status: DC | PRN
Start: 2019-12-20 — End: 2019-12-26
  Administered 2019-12-21 – 2019-12-25 (×4): 2000 [IU] via INTRAVENOUS

## 2019-12-20 MED ORDER — MESALAMINE 1.2 GM PO TBEC *I*
4.8000 g | DELAYED_RELEASE_TABLET | Freq: Every day | ORAL | Status: DC
Start: 2019-12-21 — End: 2019-12-29
  Administered 2019-12-21 – 2019-12-28 (×7): 4.8 g via ORAL
  Filled 2019-12-20 (×9): qty 4

## 2019-12-20 MED ORDER — HEPARIN INFUSION 100 UNITS/ML - *WRAPPED* FOR CALCULATORS *I*
0.0000 [IU]/h | INTRAVENOUS | Status: DC
Start: 2019-12-20 — End: 2019-12-26
  Administered 2019-12-20: 950 [IU]/h
  Administered 2019-12-20: 950 [IU]/h via INTRAVENOUS
  Administered 2019-12-21: 950 [IU]/h
  Administered 2019-12-21: 1050 [IU]/h via INTRAVENOUS
  Administered 2019-12-21 (×2): 1050 [IU]/h
  Administered 2019-12-21: 1150 [IU]/h
  Administered 2019-12-21: 1150 [IU]/h via INTRAVENOUS
  Administered 2019-12-21 (×2): 1150 [IU]/h
  Administered 2019-12-21: 1150 [IU]/h via INTRAVENOUS
  Administered 2019-12-21: 950 [IU]/h
  Administered 2019-12-21 (×2): 1150 [IU]/h
  Administered 2019-12-21: 1150 [IU]/h via INTRAVENOUS
  Administered 2019-12-22 (×9): 1150 [IU]/h
  Administered 2019-12-22 – 2019-12-23 (×2): 1150 [IU]/h via INTRAVENOUS
  Administered 2019-12-23: 1150 [IU]/h
  Administered 2019-12-24: 1150 [IU]/h via INTRAVENOUS
  Administered 2019-12-24: 1250 [IU]/h via INTRAVENOUS
  Administered 2019-12-24 – 2019-12-25 (×2): 1150 [IU]/h via INTRAVENOUS
  Administered 2019-12-25 – 2019-12-26 (×2): 1250 [IU]/h via INTRAVENOUS
  Filled 2019-12-20 (×6): qty 250

## 2019-12-20 NOTE — Progress Notes (Signed)
[id number] Admission Note      Report received from Eustace Moore, RN. Pt arrived to unit via stretcher and ambulated to bed independently. Vital signs stable Blood pressure 106/70, pulse 93, temperature 37.2 C (99 F), temperature source Oral, resp. rate 17, SpO2 99 %.   4 eyed Skin assessment completed with Lenard Lance Rn. No skin breakdown noted, clean dry intact. Pt oriented to Unit, call bell and phone. Able to make needs known. Pt resting comfortably in bed.    Donaciano Eva, RN  12/20/19  9:13 PM

## 2019-12-20 NOTE — Telephone Encounter (Signed)
Received reports and placed on Tami's desk.

## 2019-12-20 NOTE — H&P (Signed)
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 Elvin So), ulcerative colitis s/p total colectomy w/ J pouch 2010, recent ERCP with CBD stent placed on 12/04/19 who presented to [facility] ED in [place] with increasing abdominal pain, jaundice and darkened urine found to have imaging consistent with obstructive jaundice / concerns for acute cholangitis. He was admitted for IV antibiotics (Zosyn - appears to have been started on 1/12) and further work-up was revealing for PE / non-occlusive DVT for which he was started on a heparin gtt.   He had CT A/P and MRI which demonstrated liver lesions - his previous CBD stent was removed endoscopically on 1/15, but as his T bili continued to rise a repeat CT on 1/18 revealed gallbladder distension, increased ascites and stable heterogeneity of R hepatic lobe. Underwent diagnostic para on 1/19 and cultures were NGTD w/ cytology pending at time of transfer. Did have HIDA scan done which was reportedly consistent with hepatocellular dysfunction. See below for full CT, MRI and HIDA results from OSH.  For his DVT/PE he was transferred on a heparin gtt.   He has subsequently been transferred to [facility] for further evaluation.     Review of Systems:   Constitutional: Negative for fevers, chills, lightheadedness, dizziness, weight loss, weight gain, night sweats, fatigue, or weakness.  HEENT: Negative for headaches, blurry vision, dizziness, tinnitus.    CV: Negative for chest pain or palpitations.  Respiratory: Negative for shortness of breath or cough.   GI: Negative for abdominal pain, hematochezia, diarrhea, or constipation.  GU: Negative for dysuria, urinary frequency, urinary incontinence, hematuria.   Neuro: Negative for numbness, tingling.   MSK:  Negative for weakness or gait disturbances.   Heme/Lymph: Negative for lymphadenopathy, bruising, or bleeding.   Endo: Negative for cold or heat intolerance.   Skin: Negative for rashes.   Psych: Negative for mood disturbances.     Past Medical History:   Diagnosis Date    Anemia     Liver disease     Osteoporosis     Palpitations     Pouchitis     PSC (primary sclerosing cholangitis)     Ulcerative colitis      Past Surgical History:   Procedure Laterality Date    APPENDECTOMY      COLONOSCOPY      TOTAL COLECTOMY       Family History   Problem Relation Age of Onset    Crohn's disease Brother     Ulcerative colitis Brother     Colon cancer Neg Hx     Liver Disease Neg Hx      Social History     Socioeconomic History    Marital status: Single     Spouse name: Not on file    Number of children: Not on file    Years of education: Not on file    Highest education level: Not on file   Tobacco Use    Smoking status: Never Smoker    Smokeless tobacco: Never Used  Substance and Sexual Activity    Alcohol use: Not Currently    Drug use: Not Currently    Sexual activity: Not on file   Other Topics Concern    Not on file   Social History Narrative    Not on file       Allergies:   Allergies   Allergen Reactions    Adhesive Tape Rash    Sulfa Antibiotics Rash     Rash on legs        Admission Medications:  Prior to Admission medications    Medication Sig Start Date End Date Taking? Authorizing Provider   amoxicillin-clavulanate (AUGMENTIN) 500-125 MG per tablet Take 1 tablet by mouth 2 times daily 09/22/19   Juline Patch, MD   Calcium Carbonate-Simethicone (GAS-X WITH MAALOX EX ST PO) Take by mouth as needed    [provider]   amoxicillin-clavulanate (AUGMENTIN) 500-125 MG per tablet Take 1 tablet by mouth 2 times daily 01/10/19   Juline Patch, MD   mesalamine (LIALDA) 1.2 GM EC tablet Take 4.8 g by mouth daily (with breakfast)  11/29/18   [provider]   loperamide (IMODIUM) 2 MG  capsule Take 2 mg by mouth 3 times daily as needed     [provider]   multi-vitamin (TAB-A-VITE/BETA CAROTENE) per tablet Take 1 tablet by mouth every morning     [provider]   calcium-vitamin D (OSCAL-500) 500-200 MG-UNIT per tablet Take 1 tablet by mouth every morning     [provider]   cholecalciferol (VITAMIN D) 50 MCG (2000 UT) tablet Take 2,000 Units by mouth every morning     [provider]   Probiotic Product (VSL#3) CAPS Take 1 capsule by mouth every morning  12/31/17   [provider]       Physical Examination:  Vitals:    12/20/19 0905   BP: 106/70   Pulse: 93   Resp: 17   Temp: 37.2 C (99 F)     BP: (106)/(70)   Temp:  [37.2 C (99 F)]   Temp src: Oral (01/20 0905)  Heart Rate:  [93]   Resp:  [17]   SpO2:  [99 %]       Gen: Mild distress.    HEENT: Scleral icterus   Nose: No nasal discharge.   Throat: No lymphadenopathy or posterior pharynx erythema or exudates.   Pulm: No increased work of breathing. Clear to auscultation b/l. No wheezing, rhonchi, or rales.   CV: Regular rate and rhythm. No murmurs, rubs, or gallops.   Abd: Normoactive bowel sounds present. +hepatomegaly. Epigastric TTP. No fluid wave.    Skin: Diffuse jaundice  Ext: No LE edema.   Neuro: Alert and oriented to person, time, and place.     Labs reviewed, notable:  Pending     Radiology reviewed, notable:    CT A/P w/ contrast on 12/18/19: increasing abdominal ascites w/ continued interval distension of gallbladder and underlying peritonitis cannot be excluded. Stable heterogeneity throughout R hepatic lobe which remains nonspecific and underlying infectious etiology vs malignancy remains on differential. Medium R-0sided pleural effusion w/ subsegmental atelectasis vs PNA    CTA Chest on 12/12/19: R lower lobe segmental and subsegmental pulmonary emobli. Anterior cardiophrenic adenopathy, reactive vs malignant.     MRI Abdomen w/ and w/out contrast 12/12/19: lobulated contour of  liver; unchanged cyst w/ layering stones in left hepatic lobe that may represent biliary cyst. Areas of hypoenhancement in R hepatic lobe  in the distribution of the portal triads. May represent liver abscess. Gallbladder is distended w/ GB wall edema. No biliary ductal dilation. No gallstones seen. Small amount of RUQ free fluid. Upper abdominal and retroperitoneal lymph nodes, likely reactive     HIDA 12/20/19: activity remains within the hepatic parenchyma. Findings most consistent with hepatocellular dysfunction. There is no biliary ductal visualization.     Assessment:     44 yo M with PMHx of primary sclerosing cholangitis (currently on liver transplant list here at [facility] - follows w/ Dr. Elvin So), ulcerative colitis s/p total colectomy w/ J pouch 2010, recent ERCP with CBD stent placed on 12/04/19 who presented to [facility] ED in [place] with increasing abdominal pain, jaundice and darkened urine with concerns for obstructive jaundice / concerns for acute cholangitis for which he was started on Zosyn. Underwent removal of CBD stent at OSH however T bili reportedly continued to rise. Of note was found to have DVT/PE and was started on heparin gtt.Transferred to [facility] for further management.     Plan:     [place] / Obstructive Jaundice / Concerns for Acute Cholangitis: see above for imaging results from OSH - transferred to [facility] for further liver transplant evaluation.    - hold on blood cx as low yield presently    - check CMP, CBC, INR   - MELD has been 22-25 per [facility] documentation    - will need f/up on diagnostic para results from [facility]    - continue Zosyn for now, as unclear if source control obtained given reports of rising T bili    DVT/PE: hemodynamically stable; on room air. Low risk vs submassive.    - continue heparin gtt   - likely convert to oral on d/c or after procedures    - check TTE (no documentation of this being done at OSH)    - check NT-pro BNP     Ulcerative Colitis: s/p total colectomy w/ J  pouch in 2010.   - Mesalamine 4.8g daily    - tramadol, morphine prn for pain    - levibd bid for cramping      F: po  E: daily CMP   N: NPO overnight in case of procedure/work-up tmrw     Prophylaxis:    - DVT: heparin gtt    Code Status: Full    Med Rec: completed    Sharon Mt. Teressa Senter, MD   Internal Medicine, PGY-2

## 2019-12-21 ENCOUNTER — Inpatient Hospital Stay: Payer: PRIVATE HEALTH INSURANCE

## 2019-12-21 DIAGNOSIS — K831 Obstruction of bile duct: Secondary | ICD-10-CM

## 2019-12-21 DIAGNOSIS — K8301 Primary sclerosing cholangitis: Principal | ICD-10-CM

## 2019-12-21 DIAGNOSIS — K745 Biliary cirrhosis, unspecified: Secondary | ICD-10-CM

## 2019-12-21 LAB — CBC AND DIFFERENTIAL
Baso # K/uL: 0 10*3/uL (ref 0.0–0.1)
Basophil %: 0.5 %
Eos # K/uL: 0.2 10*3/uL (ref 0.0–0.5)
Eosinophil %: 2.4 %
Hematocrit: 36 % — ABNORMAL LOW (ref 40–51)
Hemoglobin: 12 g/dL — ABNORMAL LOW (ref 13.7–17.5)
IMM Granulocytes #: 0 10*3/uL (ref 0.0–0.0)
IMM Granulocytes: 0.3 %
Lymph # K/uL: 0.6 10*3/uL — ABNORMAL LOW (ref 1.3–3.6)
Lymphocyte %: 8.7 %
MCH: 30 pg/cell (ref 26–32)
MCHC: 34 g/dL (ref 32–37)
MCV: 90 fL (ref 79–92)
Mono # K/uL: 1 10*3/uL — ABNORMAL HIGH (ref 0.3–0.8)
Monocyte %: 13 %
Neut # K/uL: 5.5 10*3/uL — ABNORMAL HIGH (ref 1.8–5.4)
Nucl RBC # K/uL: 0 10*3/uL (ref 0.0–0.0)
Nucl RBC %: 0 /100 WBC (ref 0.0–0.2)
Platelets: 438 10*3/uL — ABNORMAL HIGH (ref 150–330)
RBC: 4 MIL/uL — ABNORMAL LOW (ref 4.6–6.1)
RDW: 17.7 % — ABNORMAL HIGH (ref 11.6–14.4)
Seg Neut %: 75.1 %
WBC: 7.4 10*3/uL (ref 4.2–9.1)

## 2019-12-21 LAB — COMPREHENSIVE METABOLIC PANEL
ALT: 82 U/L — ABNORMAL HIGH (ref 0–50)
AST: 125 U/L — ABNORMAL HIGH (ref 0–50)
Albumin: 3.3 g/dL — ABNORMAL LOW (ref 3.5–5.2)
Alk Phos: 682 U/L — ABNORMAL HIGH (ref 40–130)
Anion Gap: 12 (ref 7–16)
Bilirubin,Total: 21.3 mg/dL — ABNORMAL HIGH (ref 0.0–1.2)
CO2: 24 mmol/L (ref 20–28)
Calcium: 9.3 mg/dL (ref 9.0–10.3)
Chloride: 97 mmol/L (ref 96–108)
Creatinine: 0.76 mg/dL (ref 0.67–1.17)
GFR,Black: 129 *
GFR,Caucasian: 112 *
Glucose: 97 mg/dL (ref 60–99)
Lab: 9 mg/dL (ref 6–20)
Potassium: 3.8 mmol/L (ref 3.3–5.1)
Sodium: 133 mmol/L (ref 133–145)
Total Protein: 6.5 g/dL (ref 6.3–7.7)

## 2019-12-21 LAB — IGA: IgA: 224 mg/dL (ref 70–400)

## 2019-12-21 LAB — APTT
aPTT: 51.5 s — ABNORMAL HIGH (ref 25.8–37.9)
aPTT: 57.8 s — ABNORMAL HIGH (ref 25.8–37.9)
aPTT: 80.5 s — ABNORMAL HIGH (ref 25.8–37.9)

## 2019-12-21 LAB — LACTATE, PLASMA: Lactate: 0.9 mmol/L (ref 0.5–2.2)

## 2019-12-21 LAB — PROTIME-INR
INR: 1.9 — ABNORMAL HIGH (ref 0.9–1.1)
Protime: 21.3 s — ABNORMAL HIGH (ref 10.0–12.9)

## 2019-12-21 LAB — COVID-19 NAAT (PCR): COVID-19 NAAT (PCR): NEGATIVE

## 2019-12-21 LAB — TYPE AND SCREEN
ABO RH Blood Type: O POS
Antibody Screen: NEGATIVE

## 2019-12-21 LAB — COVID-19 PCR

## 2019-12-21 MED ORDER — DOCUSATE SODIUM 100 MG PO CAPS *I*
100.0000 mg | ORAL_CAPSULE | Freq: Two times a day (BID) | ORAL | Status: DC
Start: 2019-12-21 — End: 2019-12-28
  Administered 2019-12-22 – 2019-12-28 (×13): 100 mg via ORAL
  Filled 2019-12-21 (×14): qty 1

## 2019-12-21 MED ORDER — HYDROXYZINE HCL 10 MG PO TABS *I*
10.0000 mg | ORAL_TABLET | Freq: Once | ORAL | Status: AC
Start: 2019-12-21 — End: 2019-12-22
  Administered 2019-12-22: 10 mg via ORAL
  Filled 2019-12-21: qty 1

## 2019-12-21 MED ORDER — CHOLECALCIFEROL 1000 UNIT PO CAPS *WRAPPED*
2000.0000 [IU] | ORAL_CAPSULE | Freq: Every morning | ORAL | Status: DC
Start: 2019-12-21 — End: 2019-12-23
  Administered 2019-12-21 – 2019-12-22 (×2): 2000 [IU] via ORAL
  Filled 2019-12-21 (×4): qty 2

## 2019-12-21 MED ORDER — ACETAMINOPHEN 325 MG PO TABS *I*
650.0000 mg | ORAL_TABLET | Freq: Three times a day (TID) | ORAL | Status: DC | PRN
Start: 2019-12-21 — End: 2019-12-25
  Administered 2019-12-22 – 2019-12-25 (×8): 650 mg via ORAL
  Filled 2019-12-21 (×9): qty 2

## 2019-12-21 MED ORDER — MELATONIN 3 MG PO TABS *I*
3.0000 mg | ORAL_TABLET | Freq: Once | ORAL | Status: AC
Start: 2019-12-21 — End: 2019-12-22
  Administered 2019-12-22: 3 mg via ORAL
  Filled 2019-12-21: qty 1

## 2019-12-21 MED ORDER — ADEKS PO CHEW *I*
1.0000 | CHEWABLE_TABLET | Freq: Every day | ORAL | Status: DC
Start: 2019-12-21 — End: 2019-12-23
  Administered 2019-12-21 – 2019-12-23 (×3): 1 via ORAL
  Filled 2019-12-21 (×4): qty 1

## 2019-12-21 MED ORDER — SENNOSIDES 8.6 MG PO TABS *I*
1.0000 | ORAL_TABLET | Freq: Every evening | ORAL | Status: DC
Start: 2019-12-21 — End: 2019-12-23
  Administered 2019-12-22: 1 via ORAL
  Filled 2019-12-21 (×2): qty 1

## 2019-12-21 MED ORDER — CALCIUM CARBONATE-VITAMIN D 500-200 MG-UNIT PO TABS *WRAPPED*
1.0000 | ORAL_TABLET | Freq: Every morning | ORAL | Status: DC
Start: 2019-12-21 — End: 2019-12-23
  Administered 2019-12-21 – 2019-12-23 (×3): 1 via ORAL
  Filled 2019-12-21 (×3): qty 1

## 2019-12-21 MED ORDER — MORPHINE SULFATE 2 MG/ML IV SOLN *WRAPPED*
2.0000 mg | Status: DC | PRN
Start: 2019-12-21 — End: 2019-12-23
  Administered 2019-12-22: 2 mg via INTRAVENOUS

## 2019-12-21 MED ORDER — TRAMADOL HCL 50 MG PO TABS *I*
50.0000 mg | ORAL_TABLET | Freq: Three times a day (TID) | ORAL | Status: DC | PRN
Start: 2019-12-21 — End: 2019-12-23
  Administered 2019-12-21 – 2019-12-23 (×4): 50 mg via ORAL
  Filled 2019-12-21 (×4): qty 1

## 2019-12-21 NOTE — Progress Notes (Addendum)
Transplant Hepatology Progress Note    Interval History  Admitted overnight from OSH for further management of obstructive jaundice/worsening liver disease    Subjective  Feels okay - states he is still having abdominal pain which started around Thanksgiving.  More diffuse/achy across lower abdomen w/ occasional sharp/stabbing pains in RUQ.  Denies any fevers/chills, N/V.  Concerned with his weight - states the lowest he was was around 115 lbs and he has been tracking his intake and not gaining weight.  Endorses more recent development of steatorrhea prior to admission at OSH.  Otherwise denies body aches, sore throat, new onset cough, bloody/black tarry stools, or issues with fluid retention.    Past Medical History:   Diagnosis Date    Anemia     Liver disease     Osteoporosis     Palpitations     Pouchitis     PSC (primary sclerosing cholangitis)     Ulcerative colitis        Past Surgical History:   Procedure Laterality Date    APPENDECTOMY      COLONOSCOPY      TOTAL COLECTOMY         Prior to Admission medications    Medication Sig Start Date End Date Taking? Authorizing Provider   mesalamine (LIALDA) 1.2 GM EC tablet Take 4.8 g by mouth daily (with breakfast)  11/29/18  Yes [provider]   amoxicillin-clavulanate (AUGMENTIN) 500-125 MG per tablet Take 1 tablet by mouth 2 times daily 09/22/19   Juline Patch, MD   Calcium Carbonate-Simethicone (GAS-X WITH MAALOX EX ST PO) Take by mouth as needed    [provider]   amoxicillin-clavulanate (AUGMENTIN) 500-125 MG per tablet Take 1 tablet by mouth 2 times daily 01/10/19   Juline Patch, MD   loperamide (IMODIUM) 2 MG capsule Take 2 mg by mouth 3 times daily as needed     [provider]   multi-vitamin (TAB-A-VITE/BETA CAROTENE) per tablet Take 1 tablet by mouth every morning     [provider]   calcium-vitamin D (OSCAL-500) 500-200 MG-UNIT per tablet Take 1 tablet by mouth every morning     [provider]    cholecalciferol (VITAMIN D) 50 MCG (2000 UT) tablet Take 2,000 Units by mouth every morning     [provider]   Probiotic Product (VSL#3) CAPS Take 1 capsule by mouth every morning  12/31/17   [provider]       Social History     Tobacco Use    Smoking status: Never Smoker    Smokeless tobacco: Never Used   Substance Use Topics    Alcohol use: Not Currently    Drug use: Not Currently       Family History   Problem Relation Age of Onset    Crohn's disease Brother     Ulcerative colitis Brother     Colon cancer Neg Hx     Liver Disease Neg Hx          Medications  Scheduled Meds:   mesalamine  4.8 g Oral Daily with breakfast    piperacillin-tazobactam  4.5 g Intravenous Q8H     Continuous Infusions:   heparin 1,050 Units/hr (12/21/19 1137)     PRN Meds:.heparin, sodium chloride, dextrose, hyoscyamine, traMADol, morphine *PF*    Objective  BP 102/62 (BP Location: Right arm)    Pulse 90    Temp 37.3 C (99.1 F) (Oral)    Resp 16  SpO2 98%   I/O last 3 completed shifts:  01/20 0700 - 01/21 0659  In: 346.5 [P.O.:120; I.V.:93.2; IV Piggyback:133.3]  Out: -   Net: 346.5  I/O this shift:  01/21 0700 - 01/21 1459  In: 294.3 [P.O.:240; I.V.:54.3]  Out: -   Net: 294.3    General: NAD  Skin: +jaundice  HEENT: Sclera icteric  Heart:: Positive S1/S2, RRR, no m/r/gs  Lungs: CTAB  Abdomen: Normoactive bowel sounds. Soft, non-tender, and non-distended.  No guarding or rebound tenderness  Vascular: No LE edema bilaterally  Neurologic: A&O x3, no asterixis present     Laboratory Data        Lab results: 12/21/19  0450 12/20/19  2200 11/02/19  1238   Sodium 133 134 138   Potassium 3.8 3.7 4.2   Chloride 97 98 97   CO2 _0 UN _1 Creatinine 0.76 0.72 0.80   GFR,Caucasian 112 114 105   GFR,Black 129 132 128   Glucose 97 97 91   Calcium 9.3 8.8* 10.1         Lab results: 12/20/19  2200   Magnesium 2.1         Lab results: 12/20/19  2200   Phosphorus 2.9     Lab Results   Component Value  Date    ALT 82 (H) 12/21/2019    AST 125 (H) 12/21/2019    GGT 592 (H) 06/20/2019     Bilirubin,Direct   Date Value Ref Range Status   06/20/2019 0.9 (H) 0.0 - 0.3 mg/dL Final     Bilirubin,Total   Date Value Ref Range Status   12/21/2019 21.3 (H) 0.0 - 1.2 mg/dL Final     No components found for: AMYLASE    No results found for: LIP        Lab results: 12/21/19  0450 12/20/19  2200 11/02/19  1238   WBC 7.4 7.8 5.23   Hemoglobin 12.0* 11.2* 17.0   Hematocrit 36* 33* 51.20   RBC 4.0* 3.7* 5.56   Platelets 438* 428* 236         Lab results: 12/21/19  0450   Protime 21.3*   INR 1.9*     No results for input(s): CRP, ESR in the last 168 hours.    Problem List      Imaging Data   No results found.     Imaging at OSH:    CT A/P w/ contrast on 12/18/19: increasing abdominal ascites w/ continued interval distension of gallbladder and underlying peritonitis cannot be excluded. Stable heterogeneity throughout R hepatic lobe which remains nonspecific and underlying infectious etiology vs malignancy remains on differential. Medium R-0sided pleural effusion w/ subsegmental atelectasis vs PNA    CTA Chest on 12/12/19: R lower lobe segmental and subsegmental pulmonary emobli. Anterior cardiophrenic adenopathy, reactive vs malignant.     MRI Abdomen w/ and w/out contrast 12/12/19: lobulated contour of liver; unchanged cyst w/ layering stones in left hepatic lobe that may represent biliary cyst. Areas of hypoenhancement in R hepatic lobe in the distribution of the portal triads. May represent liver abscess. Gallbladder is distended w/ GB wall edema. No biliary ductal dilation. No gallstones seen. Small amount of RUQ free fluid. Upper abdominal and retroperitoneal lymph nodes, likely reactive     HIDA 12/20/19: activity remains within the hepatic parenchyma. Findings most consistent with hepatocellular dysfunction. There is no biliary ductal visualization.     I have personally reviewed  the above imaging data and agree with the  findings as noted by Radiology    Assessment/Plan  44 yo M with PMHx of PSC and UC s/p total colectomy w/ J pouch creation in 2010 who was transferred from OSH for further management of obstructive jaundice/worsening liver disease.  Presented at OSH with complaints of increasing abd pain, jaundice, and darkened urine.  His OSH course was notable for removal of previously placed CBD stent (12/04/19 as an outpatient) and was c/b new onset DVT/PE and he was started on a heparin drip.      1) PSC/obstructive jaundice/concerns for acute cholangitis - ERCP 09/22/19 w/ mild diffuse narrowing of common hepatic duct, no definite stricture noted, diffuse scarring of CBD and CHD, spyglass/brush biopsies performed  - Repeat blood cultures, continue zosyn for now  - MELD 27 today - when seen as outpatient was around 9  - Repeat MRCP/MRI given continually rising bilirubin  - Dental clearance and 2nd ABO obtained today to complete his outpatient liver txp evaluation     - Biopsies from 09/22/19 reviwed at tumor board - high amounts of inflammation which can mimic low grade dysplasia - plan was to have ERCP as an outpatient  - Underwent ERCP locally on 12/04/19 - will need to obtain report, had CBD stent placed which has since been removed at OSH, unclear if repeat biopsies were taken  - ?Repeat ERCP this admission  - ?Consider starting ursodiol   - Resume home calcium/vitamin D supplementations    2) DVT/PE - unclear etiology at this time  - Continue heparin gtt for now pending any procedures  - Will need to determine long term AC plan with attending  - Echo pending that was ordered overnight by admitting team    3) Steatorrhea and malnutrition  - Nutrition consult  - Will send for IgA level, tissue transglutaminase IgG, and fecal elastase  - Consider ADEK/MCT oil supplementation    4) Chronic stable medical conditions  - UC s/p total colectomy w/ Jpouch creation 2010 - continue home mesalamine, hyoscyamine added for cramping,  tramadol and IV morphine PRN - will wean as able    F: PO  E: daily labs, replete PRN  N: regular diet, nutrition consult    DVT ppx: heparin gtt as above  Bowel reg: colace BID, senna daily  PUD ppx: defer for now    Dispo: pending clinical course     Valarie Schiano, NP  Gastroenterology/Hepatology  [facility] #[id number]      ATTENDING PHYSICIAN ASSESSMENT AND PLAN      The patient was reviewed in collaboration with the nurse practitioner.  This case summary is meant to be complementary to that assessment. Patient was discussed at table rounds with Transplant Surgery this morning then reviewed on service rounds with a detailed chart review.    Jamarius Vento is a  43 y.o. male with PSC    Complete abdominal exam reveals sarcopenia and cachexia and abdominal tenderness    I personally reviewed pertinent laboratory investigations available in erecord.  MELD-Na score: 27 at 12/21/2019  4:50 AM  MELD score: 25 at 12/21/2019  4:50 AM  Calculated from:  Serum Creatinine: 0.76 mg/dL (Rounded to 1 mg/dL) at 12/21/2019  4:50 AM  Serum Sodium: 133 mmol/L at 12/21/2019  4:50 AM  Total Bilirubin: 21.3 mg/dL at 12/21/2019  4:50 AM  INR(ratio): 1.9 at 12/21/2019  4:50 AM  Age: 43 years 2 months      I personally reviewed pertinent   diagnostic imaging reports available in erecord.     IMPRESSION AND PLAN:  Tyjae Issa is a 44 y.o. male with PSC and biliary cirrhosis undergoing final steps of liver transplant evaluation. Question of possible malignancy has been investigated without any identified. Has had significant jump in bilirubin that may speak to stent migration. Will repeat MRCP to reassess obstruction.    Further assessment and plan as documented above.      Open notes: Please note all efforts are made to assure accuracy of this note. Mistakes can be made as we do not have scribes and real time note taking. Information detailed in the note was taken from direct patient interviews as well as chart review.          Dr. Orie Fisherman. Gala Murdoch,  MD.CM, [place] (Int Med, GI)  Hepatologist, Solid Organ Transplant Program  Associate Professor of [employer] [facility]

## 2019-12-21 NOTE — Provider Consult (Addendum)
[facility] ([facility]) Consult Note    Patient's Name: Kyle Bright  MRN:  [id number]  DOB/Age/Gender:  [birth year]44 y.o./male    PCP:  Unknown, Provider, PA (None)    Reason for Consultation:  Kamil Mchaffie is referred to [facility] for dental clearance prior to liver transplant.    Chief Complaint:  I want clearance for liver transplant.  Pain score:  0/10    Past Medical Hx:   Past Medical History:   Diagnosis Date    Anemia     Liver disease     Osteoporosis     Palpitations     Pouchitis     PSC (primary sclerosing cholangitis)     Ulcerative colitis        Past Surgical Hx:   Past Surgical History:   Procedure Laterality Date    APPENDECTOMY      COLONOSCOPY      TOTAL COLECTOMY         Medications:   Prior to Admission medications    Medication Sig Start Date End Date Taking? Authorizing Provider   mesalamine (LIALDA) 1.2 GM EC tablet Take 4.8 g by mouth daily (with breakfast)  11/29/18  Yes [provider]   amoxicillin-clavulanate (AUGMENTIN) 500-125 MG per tablet Take 1 tablet by mouth 2 times daily 09/22/19   Juline Patch, MD   Calcium Carbonate-Simethicone (GAS-X WITH MAALOX EX ST PO) Take by mouth as needed    [provider]   amoxicillin-clavulanate (AUGMENTIN) 500-125 MG per tablet Take 1 tablet by mouth 2 times daily 01/10/19   Juline Patch, MD   loperamide (IMODIUM) 2 MG capsule Take 2 mg by mouth 3 times daily as needed     [provider]   multi-vitamin (TAB-A-VITE/BETA CAROTENE) per tablet Take 1 tablet by mouth every morning     [provider]   calcium-vitamin D (OSCAL-500) 500-200 MG-UNIT per tablet Take 1 tablet by mouth every morning     [provider]   cholecalciferol (VITAMIN D) 50 MCG (2000 UT) tablet Take 2,000 Units by mouth every morning     [provider]   Probiotic Product (VSL#3) CAPS Take 1 capsule by mouth every morning  12/31/17   [provider]      (home meds)  Current Facility-Administered  Medications   Medication Dose Route Frequency    heparin 25000 units/250 mL (100 units/mL) infusion  0-3,000 Units/hr Intravenous Continuous    Heparin Bolus(es) from infusion 100 units/ml PRN low aPTT  0-10,000 unit Intravenous PRN    sodium chloride 0.9 % FLUSH REQUIRED IF PATIENT HAS IV  0-500 mL/hr Intravenous PRN    dextrose 5 % FLUSH REQUIRED IF PATIENT HAS IV  0-500 mL/hr Intravenous PRN    hyoscyamine (LEVBID) 12 hr tablet 375 mcg  375 mcg Oral Q12H PRN    traMADol (ULTRAM) tablet 50 mg  50 mg Oral Q12H PRN    morphine 2 mg/mL injection 2 mg  2 mg Intravenous Q4H PRN    mesalamine (LIALDA) EC tablet 4.8 g  4.8 g Oral Daily with breakfast    piperacillin-tazobactam (ZOSYN) IVPB 4.5 g  4.5 g Intravenous Q8H      (current meds)    Allergies:  Adhesive tape and Sulfa antibiotics    Social History: Eudell  reports that he has never smoked. He has never used smokeless tobacco. He reports previous alcohol use. He reports previous drug use.     VS: BP 104/64 (BP Location:  Right arm)    Pulse 88    Temp 36.5 C (97.7 F) (Oral)    Resp 17    SpO2 98%     Labs:  Recent Results (from the past 72 hour(s))   APTT    Collection Time: 12/20/19 10:00 PM   Result Value Ref Range    aPTT 78.6 (H) 25.8 - 37.9 sec   Comprehensive metabolic panel    Collection Time: 12/20/19 10:00 PM   Result Value Ref Range    Sodium 134 133 - 145 mmol/L    Potassium 3.7 3.3 - 5.1 mmol/L    Chloride 98 96 - 108 mmol/L    CO2 23 20 - 28 mmol/L    Anion Gap 13 7 - 16    UN 10 6 - 20 mg/dL    Creatinine 0.72 0.67 - 1.17 mg/dL    GFR,Caucasian 114 *    GFR,Black 132 *    Glucose 97 60 - 99 mg/dL    Calcium 8.8 (L) 9.0 - 10.3 mg/dL    Total Protein 6.0 (L) 6.3 - 7.7 g/dL    Albumin 3.1 (L) 3.5 - 5.2 g/dL    Bilirubin,Total 18.8 (H) 0.0 - 1.2 mg/dL    AST 107 (H) 0 - 50 U/L    ALT 74 (H) 0 - 50 U/L    Alk Phos 616 (H) 40 - 130 U/L   CBC and differential    Collection Time: 12/20/19 10:00 PM   Result Value Ref Range    Platelets 428 (H) 150 -  330 THOU/uL    WBC 7.8 4.2 - 9.1 THOU/uL    RBC 3.7 (L) 4.6 - 6.1 MIL/uL    Hemoglobin 11.2 (L) 13.7 - 17.5 g/dL    Hematocrit 33 (L) 40 - 51 %    MCV 89 79 - 92 fL    MCH 30 26 - 32 pg/cell    MCHC 34 32 - 37 g/dL    RDW 17.4 (H) 11.6 - 14.4 %    Seg Neut % 71.5 %    Lymphocyte % 10.9 %    Monocyte % 14.2 %    Eosinophil % 2.7 %    Basophil % 0.4 %    Neut # K/uL 5.6 (H) 1.8 - 5.4 THOU/uL    Lymph # K/uL 0.9 (L) 1.3 - 3.6 THOU/uL    Mono # K/uL 1.1 (H) 0.3 - 0.8 THOU/uL    Eos # K/uL 0.2 0.0 - 0.5 THOU/uL    Baso # K/uL 0.0 0.0 - 0.1 THOU/uL    Nucl RBC % 0.0 0.0 - 0.2 /100 WBC    Nucl RBC # K/uL 0.0 0.0 - 0.0 THOU/uL    IMM Granulocytes # 0.0 0.0 - 0.0 THOU/uL    IMM Granulocytes 0.3 %   Magnesium    Collection Time: 12/20/19 10:00 PM   Result Value Ref Range    Magnesium 2.1 1.6 - 2.5 mg/dL   Phosphorus    Collection Time: 12/20/19 10:00 PM   Result Value Ref Range    Phosphorus 2.9 2.7 - 4.5 mg/dL   Protime-INR    Collection Time: 12/20/19 10:00 PM   Result Value Ref Range    Protime 21.4 (H) 10.0 - 12.9 sec    INR 1.9 (H) 0.9 - 1.1   NT-pro BNP    Collection Time: 12/20/19 10:00 PM   Result Value Ref Range    NT-pro BNP 90 0 - 450 pg/mL  Comprehensive metabolic panel    Collection Time: 12/21/19  4:50 AM   Result Value Ref Range    Sodium 133 133 - 145 mmol/L    Potassium 3.8 3.3 - 5.1 mmol/L    Chloride 97 96 - 108 mmol/L    CO2 24 20 - 28 mmol/L    Anion Gap 12 7 - 16    UN 9 6 - 20 mg/dL    Creatinine 0.76 0.67 - 1.17 mg/dL    GFR,Caucasian 112 *    GFR,Black 129 *    Glucose 97 60 - 99 mg/dL    Calcium 9.3 9.0 - 10.3 mg/dL    Total Protein 6.5 6.3 - 7.7 g/dL    Albumin 3.3 (L) 3.5 - 5.2 g/dL    Bilirubin,Total 21.3 (H) 0.0 - 1.2 mg/dL    AST 125 (H) 0 - 50 U/L    ALT 82 (H) 0 - 50 U/L    Alk Phos 682 (H) 40 - 130 U/L   CBC and differential    Collection Time: 12/21/19  4:50 AM   Result Value Ref Range    Platelets 438 (H) 150 - 330 THOU/uL    WBC 7.4 4.2 - 9.1 THOU/uL    RBC 4.0 (L) 4.6 - 6.1 MIL/uL     Hemoglobin 12.0 (L) 13.7 - 17.5 g/dL    Hematocrit 36 (L) 40 - 51 %    MCV 90 79 - 92 fL    MCH 30 26 - 32 pg/cell    MCHC 34 32 - 37 g/dL    RDW 17.7 (H) 11.6 - 14.4 %    Seg Neut % 75.1 %    Lymphocyte % 8.7 %    Monocyte % 13.0 %    Eosinophil % 2.4 %    Basophil % 0.5 %    Neut # K/uL 5.5 (H) 1.8 - 5.4 THOU/uL    Lymph # K/uL 0.6 (L) 1.3 - 3.6 THOU/uL    Mono # K/uL 1.0 (H) 0.3 - 0.8 THOU/uL    Eos # K/uL 0.2 0.0 - 0.5 THOU/uL    Baso # K/uL 0.0 0.0 - 0.1 THOU/uL    Nucl RBC % 0.0 0.0 - 0.2 /100 WBC    Nucl RBC # K/uL 0.0 0.0 - 0.0 THOU/uL    IMM Granulocytes # 0.0 0.0 - 0.0 THOU/uL    IMM Granulocytes 0.3 %   Protime-INR    Collection Time: 12/21/19  4:50 AM   Result Value Ref Range    Protime 21.3 (H) 10.0 - 12.9 sec    INR 1.9 (H) 0.9 - 1.1   APTT    Collection Time: 12/21/19  4:50 AM   Result Value Ref Range    aPTT 51.5 (H) 25.8 - 37.9 sec           Clinical Evaluation:  Submandibular lymph nodes:  Non-tender, non-palpable.  no lymphadenopathy noted.      Face and neck:  no extra-oral swelling, no facial asymmetry, no tenderness to palpation noted.  Lips:  Lips are WNL.        TMJ:  no pain or clicking noted on bilateral TMJ, no pain/discomfort/limitation of motion on wide opening and lateral excursion.          Oral mucosa:  Soft tissues appear pink and moist.  no intra-oral swelling noted.  no elevation of floor of mouth noted.  No tenderness to palpation.  Uvula is at midline.  Teeth:               Missing:  none               Amalgams:  #17,32               Composites:  #31               Crowns:  #30               RCT:  #30                         Mobility: none                                Gingiva:  Gingiva appears erythematous generally.    Radiographic Evaluation:  TMJ: WNL                                                               Alveolar bone:  Appears well-trabeculated.                          Periapical radiolucencies:  No periapical radiolucencies noted.  no widened PDL spaces  noted.   Furcation involvement:  no furcation inolvement noted.                    Missing: none  Retained roots:   none                                                           Impacted teeth/buds:  none  Caries:  #17-O  Diagnosis: Liver cirrhosis, Caries #17    Assessment:  Gatsby Chismar is referred to Hospital [facility] for dental clearance prior to liver transplant.  Guerino  has a past medical history of Anemia, Liver disease, Osteoporosis, Palpitations, Pouchitis, PSC (primary sclerosing cholangitis), and Ulcerative colitis. Dental findings as documented above.     Discussed Jovoni's medical history, medications, allergies, relevant lab results, relevant dental radiographs, vital signs, and treatment plan with attending.         Plan:    1. No signs of acute infections/pain. Patient cleared for liver transplant from dental perspective.  2. Advised to follow up with [facility] hospital dentistry or dentist of choice for extraction of #17 after the liver transplant.    Treatment Completed this Visit:  1. Dental examination/consultation completed.  Radiographs taken as appropriate.  Findings as noted above.  Discussed findings with Olen Cordial.  Discussed treatment options, risks/benefits of treatment options, and plan as detailed above.  All questions answered.  [place] agrees with plan.         For any questions/concerns, please contact the Dental Resident On-Call.    Laray Anger, DDS  [facility] Dental Resident

## 2019-12-21 NOTE — Progress Notes (Signed)
12/21/19 1124   UM Patient Class Review   Patient Class Review Inpatient   Patient class effective 12/20/19     [facility] RN,  Utilization Management    [phone]

## 2019-12-21 NOTE — Progress Notes (Signed)
Patient's chart reviewed for discharge planning needs. [facility] will continue to be available to coordinate discharge.    Oluwateniola Leitch, RN-BC  Acute Care Coordinator [id number]  Phone # [phone]  Pager # [phone]  Weekend Pager # [phone]

## 2019-12-22 ENCOUNTER — Other Ambulatory Visit: Payer: Self-pay

## 2019-12-22 ENCOUNTER — Inpatient Hospital Stay: Payer: PRIVATE HEALTH INSURANCE

## 2019-12-22 ENCOUNTER — Inpatient Hospital Stay: Payer: PRIVATE HEALTH INSURANCE | Admitting: Radiology

## 2019-12-22 DIAGNOSIS — K769 Liver disease, unspecified: Secondary | ICD-10-CM

## 2019-12-22 DIAGNOSIS — R188 Other ascites: Secondary | ICD-10-CM

## 2019-12-22 DIAGNOSIS — I2699 Other pulmonary embolism without acute cor pulmonale: Secondary | ICD-10-CM

## 2019-12-22 DIAGNOSIS — K828 Other specified diseases of gallbladder: Secondary | ICD-10-CM

## 2019-12-22 DIAGNOSIS — R161 Splenomegaly, not elsewhere classified: Secondary | ICD-10-CM

## 2019-12-22 DIAGNOSIS — K56609 Unspecified intestinal obstruction, unspecified as to partial versus complete obstruction: Secondary | ICD-10-CM

## 2019-12-22 DIAGNOSIS — K5989 Other specified functional intestinal disorders: Secondary | ICD-10-CM

## 2019-12-22 LAB — ECHO COMPLETE
BMI: 17.4 kg/m2
BP Diastolic: 63 mmHg
BP Systolic: 107 mmHg
BSA: 1.61 m2
Heart Rate: 90 {beats}/min
Height: 69.016 in
LA Systolic Vol BSA Index: 12.4 mL/m2
LA Systolic Vol Height Index: 11.4 mL/m
LA Systolic Volume: 20 mL
LV ASE Mass BSA Index: 60.5 gm/m2
LV ASE Mass Height 2.7 Index: 21.4 gm/m2.7
LV ASE Mass Height Index: 55.5 gm/m
LV ASE Mass: 97.3 gm
LV CO BSA Index: 3.58 L/min/m2
LV Cardiac Output: 5.76 L/min
LV Diastolic Volume Index: 51.6 mL/m2
LV Posterior Wall Thickness: 0.8 cm
LV SV - LVOT SV Diff: 4.46 mL
LV SV BSA Index: 39.8 mL/m2
LV SV Height Index: 36.5 mL/m
LV Septal Thickness: 0.8 cm
LV Stroke Volume: 64 mL
LV Systolic Volume Index: 11.8 mL/m2
LVED Diameter BSA Index: 2.5 cm/m2
LVED Diameter Height Index: 2.3 cm/m
LVED Diameter: 4.1 cm
LVED Volume BSA Index: 51.6 mL/m2
LVED Volume BSA Index: 52 ml/m2
LVED Volume Height Index: 47.3 mL/m
LVED Volume: 83 mL
LVEF (Volume): 77 %
LVES Volume BSA Index: 11.8 mL/m2
LVES Volume BSA Index: 12 ml/m2
LVES Volume Height Index: 10.8 mL/m
LVES Volume: 19 mL
LVOT Area (calculated): 3.46 cm2
LVOT Cardiac Index: 3.33 L/min/m2
LVOT Cardiac Output: 5.36 L/min
LVOT Diameter: 2.1 cm
LVOT PWD VTI: 17.2 cm
LVOT PWD Velocity (mean): 63.2 cm/s
LVOT PWD Velocity (peak): 86 cm/s
LVOT SV BSA Index: 36.98 mL/m2
LVOT SV Height Index: 34 mL/m
LVOT Stroke Rate (mean): 218.8 mL/s
LVOT Stroke Rate (peak): 297.7 mL/s
LVOT Stroke Volume: 59.54 cc
MR Regurgitant Fraction (LV SV Mtd): 0.07
MR Regurgitant Volume (LV SV Mtd): 4.5 mL
RR Interval: 666.67 ms
Weight (lbs): 117.95 [lb_av]
Weight: 1887.14 oz

## 2019-12-22 LAB — CBC AND DIFFERENTIAL
Baso # K/uL: 0 10*3/uL (ref 0.0–0.1)
Basophil %: 0.5 %
Eos # K/uL: 0.2 10*3/uL (ref 0.0–0.5)
Eosinophil %: 2.6 %
Hematocrit: 38 % — ABNORMAL LOW (ref 40–51)
Hemoglobin: 12.7 g/dL — ABNORMAL LOW (ref 13.7–17.5)
IMM Granulocytes #: 0 10*3/uL (ref 0.0–0.0)
IMM Granulocytes: 0.3 %
Lymph # K/uL: 0.7 10*3/uL — ABNORMAL LOW (ref 1.3–3.6)
Lymphocyte %: 8.3 %
MCH: 30 pg/cell (ref 26–32)
MCHC: 34 g/dL (ref 32–37)
MCV: 90 fL (ref 79–92)
Mono # K/uL: 1 10*3/uL — ABNORMAL HIGH (ref 0.3–0.8)
Monocyte %: 11.9 %
Neut # K/uL: 6.7 10*3/uL — ABNORMAL HIGH (ref 1.8–5.4)
Nucl RBC # K/uL: 0 10*3/uL (ref 0.0–0.0)
Nucl RBC %: 0 /100 WBC (ref 0.0–0.2)
Platelets: 440 10*3/uL — ABNORMAL HIGH (ref 150–330)
RBC: 4.2 MIL/uL — ABNORMAL LOW (ref 4.6–6.1)
RDW: 17.9 % — ABNORMAL HIGH (ref 11.6–14.4)
Seg Neut %: 76.4 %
WBC: 8.8 10*3/uL (ref 4.2–9.1)

## 2019-12-22 LAB — COMPREHENSIVE METABOLIC PANEL
ALT: 86 U/L — ABNORMAL HIGH (ref 0–50)
AST: 137 U/L — ABNORMAL HIGH (ref 0–50)
Albumin: 3.3 g/dL — ABNORMAL LOW (ref 3.5–5.2)
Alk Phos: 681 U/L — ABNORMAL HIGH (ref 40–130)
Anion Gap: 15 (ref 7–16)
Bilirubin,Total: 21.1 mg/dL — ABNORMAL HIGH (ref 0.0–1.2)
CO2: 22 mmol/L (ref 20–28)
Calcium: 9.4 mg/dL (ref 9.0–10.3)
Chloride: 99 mmol/L (ref 96–108)
Creatinine: 0.78 mg/dL (ref 0.67–1.17)
GFR,Black: 128 *
GFR,Caucasian: 110 *
Glucose: 123 mg/dL — ABNORMAL HIGH (ref 60–99)
Lab: 11 mg/dL (ref 6–20)
Potassium: 3.9 mmol/L (ref 3.3–5.1)
Sodium: 136 mmol/L (ref 133–145)
Total Protein: 6.6 g/dL (ref 6.3–7.7)

## 2019-12-22 LAB — PROTIME-INR
INR: 2 — ABNORMAL HIGH (ref 0.9–1.1)
Protime: 23.3 s — ABNORMAL HIGH (ref 10.0–12.9)

## 2019-12-22 LAB — PHOSPHORUS: Phosphorus: 2.8 mg/dL (ref 2.7–4.5)

## 2019-12-22 LAB — MAGNESIUM: Magnesium: 2.5 mg/dL (ref 1.6–2.5)

## 2019-12-22 LAB — APTT: aPTT: 86 s — ABNORMAL HIGH (ref 25.8–37.9)

## 2019-12-22 MED ORDER — PERFLUTREN PROTEIN A MICROSPH (OPTISON) IV SUSP *I*
1.5000 mL | INTRAVENOUS | Status: DC | PRN
Start: 2019-12-22 — End: 2019-12-23

## 2019-12-22 MED ORDER — IOHEXOL 350 MG/ML (OMNIPAQUE) IV SOLN *I*
1.0000 mL | Freq: Once | INTRAVENOUS | Status: AC
Start: 2019-12-22 — End: 2019-12-22
  Administered 2019-12-22: 80 mL via INTRAVENOUS

## 2019-12-22 MED ORDER — GADOTERIDOL 279.3 MG/ML (PROHANCE) IV SOLN *I*
14.0000 mL | Freq: Once | INTRAVENOUS | Status: DC
Start: 2019-12-22 — End: 2019-12-23

## 2019-12-22 MED ORDER — MEDIUM CHAIN TRIGLYCERIDES PO OIL *I*
15.0000 mL | TOPICAL_OIL | Freq: Three times a day (TID) | ORAL | Status: DC
Start: 2019-12-22 — End: 2019-12-23
  Filled 2019-12-22 (×7): qty 15

## 2019-12-22 MED ORDER — HYDROMORPHONE HCL 2 MG PO TABS *I*
2.0000 mg | ORAL_TABLET | Freq: Every evening | ORAL | Status: DC | PRN
Start: 2019-12-22 — End: 2019-12-23

## 2019-12-22 MED ORDER — STERILE WATER FOR IRRIGATION IR SOLN *I*
900.0000 mL | Freq: Once | Status: AC
Start: 2019-12-22 — End: 2019-12-22
  Administered 2019-12-22: 900 mL via ORAL

## 2019-12-22 MED ORDER — MORPHINE SULFATE 4 MG/ML IV SOLN *WRAPPED*
INTRAVENOUS | Status: AC
Start: 2019-12-22 — End: 2019-12-22
  Filled 2019-12-22: qty 1

## 2019-12-22 NOTE — Consults (Signed)
Medical Nutrition Therapy - Initial Assessment    Admit Date: 12/20/2019    Reason for consult: Eval & Treat    Patient Summary: 44 yo M with PMHx [place] and UC s/p total colectomy w/ J pouch creation in 2010 who was transferred from OSH for further management of obstructive jaundice/worsening liver disease. Presented at OSH with complaints of increasing abd pain, jaundice, and darkened urine. His OSH course was notable for removal of previously placed CBD stent (12/04/19 as an outpatient) and was c/b new onset DVT/PE and he was started on a heparin drip.     Past Medical History:   Diagnosis Date    Anemia     Liver disease     Osteoporosis     Palpitations     Pouchitis     PSC (primary sclerosing cholangitis)     Ulcerative colitis      Past Surgical History:   Procedure Laterality Date    APPENDECTOMY      COLONOSCOPY      TOTAL COLECTOMY          Pertinent Social Hx: assessed as outpatient on 7/20, at time lied with partner [name]    Pertinent Meds: reviewed   mesalamine (LIALDA) 1.2 GM EC tablet Take 4.8 g by mouth daily (with breakfast)  11/29/18  Yes [provider]   amoxicillin-clavulanate (AUGMENTIN) 500-125 MG per tablet Take 1 tablet by mouth 2 times daily 09/22/19   Juline Patch, MD   Calcium Carbonate-Simethicone (GAS-X WITH MAALOX EX ST PO) Take by mouth as needed    [provider]   amoxicillin-clavulanate (AUGMENTIN) 500-125 MG per tablet Take 1 tablet by mouth 2 times daily 01/10/19   Juline Patch, MD   loperamide (IMODIUM) 2 MG capsule Take 2 mg by mouth 3 times daily as needed     [provider]   multi-vitamin (TAB-A-VITE/BETA CAROTENE) per tablet Take 1 tablet by mouth every morning     [provider]   calcium-vitamin D (OSCAL-500) 500-200 MG-UNIT per tablet Take 1 tablet by mouth every morning     [provider]   cholecalciferol (VITAMIN D) 50 MCG (2000 UT) tablet Take 2,000 Units by mouth every morning      [provider]   Probiotic Product (VSL#3) CAPS Take 1 capsule by mouth every morning            Pertinent Labs: reviewed   Lab results: 12/21/19  0450 12/20/19  2200 11/02/19  1238   Sodium 133 134 138   Potassium 3.8 3.7 4.2   Chloride 97 98 97   CO2 24 23 31    UN 9 10 5    Creatinine 0.76 0.72 0.80   GFR,Caucasian 112 114 105   GFR,Black 129 132 128   Glucose 97 97 91   Calcium 9.3 8.8* 10.1     1/22 albumin 3.3        Lab results: 12/20/19  2200   Magnesium 2.1             Lab results: 12/20/19  2200   Phosphorus 2.9       Reviewed I/O's  I/O last 3 completed shifts:  01/20 0700 - 01/21 0659  In: 346.5   Out: -   Net: 346.5  I/O this shift:  01/21 0700 - 01/21 1459  In: 294.3   Out: -   Net: 294.3  Enteral or parenteral access: PIV    Nutrition Hx: ;when assessed in 7/20  had good appetite, intake; challenge for patient was fluid management as he reported high fluid lossess  Concern for malabsorption    Food allergies: NKFA    Current diet: Regular  Supplements: None, at home he has taken ensure, protein powder, made own protein shakes with greek yogurt, fruit, benefiber type fiber supplement    Nutrition Focused Physical Exam:  Edema: none noted  Abdomen: tenderness, guarded  Skin: jaundiced  Body fat stores: moderate loss  Lean body mass stores: moderate loss    Anthropometrics:  Height: 175.3 cm (5' 9.02")    Admit Weight: 53.5 kg (117#)  Current Weight: 53.5 kg (117 lb 15.1 oz); % 73 IBW; 98 UBW  UBW: 54.4 kg  Ideal Body Weight: 72.7  kg + 10%  BMI: 17.41 kg/(m^2)   Weight Hx: 54.4 kg on 06/20/19  Down approx 1 kg from 6 mo.    Estimated Nutritional Needs (based on 54.4g) based on uBW   kcal 1630-1900 kcal/day (30-35 kcal/kg)   g protein 65-82 g protein/day (1.2-1.5g protein/kg)   mL fluid 1630-1900 mL/day (30-35 mL/kg)           Nutrition Assessment and Diagnosis:     Malnutrition Status: Pt with evidence of moderate malnutrition related to current condition as evidenced by loss of muscle mass,loss  of subcutaneous fat stores, BMI 17.72   as per nutrition assessment completed 7/20. Weight is fairly stable, just 1 kg down from his usual weight.         Nutrition Intervention:   1. Continue regular diet  2. Recommend Consider trial of MCT oil with  Short bowel. Can begin with 15 ml with meal, max dose 20 ml.  3. Dosing:Can provide up to 50-100 ml MCT / day given equally divided dose/meal  4. Will Add ensure supplement, ensure surgery bid as this product contains MCT (8 oz.=330 kcal, 18 g. Protein)    Nutrition Monitoring/Evaluation:   1. Will monitor diet tolerance and intake, nutrition-related labs, weight trend, BM pattern, supplement acceptance.   2. Nutrition to follow up per high nutrition risk protocol.    Percival Spanish MS,  RDN, CDN, CSR, [facility]  Liver Transplant Dietitian  [facility] # [phone]

## 2019-12-22 NOTE — Progress Notes (Signed)
Report Given    Diagnostic Imaging Nurse Handoff:    Study: MR abdomen without and with contrast &  mrcp with 3D render  A&O:oriented to person, place, and time  Telemetry:No  IV Access:  Peripheral IV Left Forearm (distal third) (Active)       Peripheral IV  Left;Anterior Forearm (middle third) (Active)     Continuous Infusions:Yes,1  Transducer Cables Inspected for Damage:No  Additional lines and drains: None  Medication Patches/Insulin Pumps:No  Precautions:None  Hospital gown:Yes  Transport:Stretcher  Hover Requested:Yes  Respiratory:Room Air  Sleep Apnea:No  Claustrophobic:No  Sedation Needed:No  Pain Concerns:No  Able to Lay Flat for the Duration of the Scan:Yes  Code status: Full Code    Linden Dolin, RN Received Handoff Report from [facility] for MRI 4:03 PM      Procedure Summary

## 2019-12-22 NOTE — Progress Notes (Addendum)
Transplant Hepatology Follow up    Admit Date:  12/20/2019    Interval Events:   Awaiting MR/MRCP  Otherwise no acute events overnight    Subjective:   Still with significant abdominal pain at night, especially before and during a BM. No blood or melena. He is tolerating PO intake. No fevers or chills. No increased frequency of BMs or diarrhea.     Medications:    Scheduled Meds:   calcium-vitamin D  1 tablet Oral QAM    cholecalciferol  2,000 unit Oral QAM    docusate sodium  100 mg Oral 2 times per day    senna  1 tablet Oral Nightly    vitamins A-D-E-K  1 tablet Oral Daily    mesalamine  4.8 g Oral Daily with breakfast    piperacillin-tazobactam  4.5 g Intravenous Q8H     Continuous Infusions:   heparin 1,150 Units/hr (12/22/19 0759)     PRN Meds:.perflutren protein A microsph, acetaminophen, traMADol, morphine *PF*, heparin, sodium chloride, dextrose, hyoscyamine    Physical examination:      BP 106/65 (BP Location: Left arm)    Pulse 84    Temp 37.5 C (99.5 F) (Oral)    Resp 16    Ht 175.3 cm (5' 9.02")    Wt 53.5 kg (117 lb 15.1 oz)    SpO2 98%    BMI 17.41 kg/m   General: NAD  Skin: +jaundice  HEENT: Sclera icteric  Heart:: Positive S1/S2, RRR, no m/r/gs  Lungs: CTAB  Abdomen: Normoactive bowel sounds. Soft, non-tender, and non-distended.  No guarding or rebound tenderness  Vascular: No LE edema bilaterally  Neurologic: A&O x3, no asterixis present     LAB DATA:         Lab results: 12/22/19  0226 12/21/19  0450 12/20/19  2200 11/02/19  1238 06/20/19  1235   WBC 8.8 7.4 7.8 5.23 5.4   5.6   Hemoglobin 12.7* 12.0* 11.2* 17.0 16.8   17.3   Hematocrit 38* 36* 33* 51.20 50   51   RBC 4.2* 4.0* 3.7* 5.56 5.6   5.7   Platelets 440* 438* 428* 236 246   290           Lab results: 12/22/19  0226 12/21/19  0450 12/20/19  2200   Sodium 136 133 134   Potassium 3.9 3.8 3.7   Chloride 99 97 98   CO2 _0 UN _1 Creatinine 0.78 0.76 0.72   GFR,Caucasian 110 112 114   GFR,Black 128 129 132   Glucose  123* 97 97   Calcium 9.4 9.3 8.8*   Total Protein 6.6 6.5 6.0*   Albumin 3.3* 3.3* 3.1*   ALT 86* 82* 74*   AST 137* 125* 107*   Alk Phos 681* 682* 616*   Bilirubin,Total 21.1* 21.3* 18.8*           Lab results: 12/22/19  0226   INR 2.0*     MELD-Na score: 26 at 12/22/2019  2:26 AM  MELD score: 26 at 12/22/2019  2:26 AM  Calculated from:  Serum Creatinine: 0.78 mg/dL (Rounded to 1 mg/dL) at 12/22/2019  2:26 AM  Serum Sodium: 136 mmol/L at 12/22/2019  2:26 AM  Total Bilirubin: 21.1 mg/dL at 12/22/2019  2:26 AM  INR(ratio): 2.0 at 12/22/2019  2:26 AM  Age: 44 years 2 months      IMAGING:   CT A/P w/ contrast on 12/18/19:  increasing abdominal ascites w/ continued interval distension of gallbladder and underlying peritonitis cannot be excluded. Stable heterogeneity throughout R hepatic lobe which remains nonspecific and underlying infectious etiology vs malignancy remains on differential. Medium R-0sided pleural effusion w/ subsegmental atelectasis vs PNA    CTA Chest on 12/12/19: R lower lobe segmental and subsegmental pulmonary emobli. Anterior cardiophrenic adenopathy, reactive vs malignant.     MRI Abdomen w/ and w/out contrast 12/12/19: lobulated contour of liver; unchanged cyst w/ layering stones in left hepatic lobe that may represent biliary cyst. Areas of hypoenhancement in R hepatic lobe in the distribution of the portal triads. May represent liver abscess. Gallbladder is distended w/ GB wall edema. No biliary ductal dilation. No gallstones seen. Small amount of RUQ free fluid. Upper abdominal and retroperitoneal lymph nodes, likely reactive     HIDA 12/20/19: activity remains within the hepatic parenchyma. Findings most consistent with hepatocellular dysfunction. There is no biliary ductal visualization.    ASSESSMENT/RECOMMENDATIONS:   44 yo M with PMHx [place] and UC s/p total colectomy w/ J pouch creation in 2010 who was transferred from OSH for further management of obstructive jaundice/worsening liver disease.   Presented at OSH with complaints of increasing abd pain, jaundice, and darkened urine.  His OSH course was notable for removal of previously placed CBD stent (12/04/19 as an outpatient) and was c/b new onset DVT/PE and he was started on a heparin drip.      1) PSC/obstructive jaundice/concerns for acute cholangitis - ERCP 09/22/19 w/ mild diffuse narrowing of common hepatic duct, no definite stricture noted, diffuse scarring of CBD and CHD, spyglass/brush biopsies performed  - Follow-up blood cultures. Continue Zosyn empirically  - MELD 26 today, blood group O  - Repeat MR/MRCP - ordered but still awaiting. I called MRI and they have him scheduled for this to be done today  - Dental clearance and 2nd ABO obtained today to complete his outpatient liver txp evaluation     - Biopsies from 09/22/19 reviwed at tumor board - high amounts of inflammation which can mimic low grade dysplasia - plan was to have ERCP as an outpatient  - Resume home calcium/vitamin D supplementations    2) DVT/PE - unclear etiology at this time  - Continue heparin gtt for now pending any procedures  - Echo exam begun, will await report    3) Steatorrhea and malnutrition  - Nutrition consult  - Pending IgA level, tissue transglutaminase IgG, and fecal elastase  - Continue ADEK/MCT oil supplementation    4) Chronic stable medical conditions  - UC s/p total colectomy w/ Jpouch creation 2010 - continue home mesalamine, hyoscyamine added for cramping, tramadol and PO hydromorphone.   - Would consider adding gabapentin to help taper off/avoid all narcotics, and given patient with enteral access    F: PO  E: daily labs, replete PRN  N: regular diet, nutrition consult    DVT ppx: heparin gtt as above  Bowel reg: colace BID, senna (will discuss discontinuing senna and starting Miralax instead)  PUD ppx: defer for now    Dispo: pending clinical course     Laroy Apple, MD  PGY-6, Gastroenterology and Hepatology Fellow    Patient was reviewed at  table rounds with transplant surgery and further reviewed on rounds.I saw and evaluated the patient. I agree with the resident's/fellow's findings and plan of care as documented above.      Open notes: Please note all efforts are made to assure accuracy of this note.  Mistakes can be made as we do not have scribes and real time note taking. Information detailed in the note was taken from direct patient interviews as well as chart review. If you find something important you wish corrected in the record please let us know.            Dr. Orie Fisherman. Gala Murdoch, MD.CM, [place] (Int Med, GI)  Hepatologist, Solid Organ Transplant Program  Associate Professor of [place] [facility]

## 2019-12-22 NOTE — Plan of Care (Signed)
Called by radiology:  Pt unable to complete MR abdomen    On images obtained - concern for SBO with dilated small bowel up to 4 cm.  Recommend CT abd/pelvis     Discussed with Dr Leota Jacobsen - will get CT scan abd/pelvis tonight     Gertie Exon, NP  12/22/19  6:53 PM

## 2019-12-22 NOTE — Progress Notes (Signed)
Nutrition Progress Note        O:     Height: 175.3 cm (5'9")   Current Weight: 53.5 kg ( lb);73 % IBW    BMI: 17.41 kg/(m^2)        Edema: none noted, per RN Shift Assessment    Albumin of 3.3  Skin:WDL , per RN Shift Assessment        Body fat stores: moderate/severe  Lean body mass stores: moderate /severe           Registered Dietitian Assessment: Upon nutritional assessment, patient meets criteria for  moderate malnutrition related to current illness as evidenced by     Moderate/severe  loss of muscle mass, moderate/severe loss of subcutaneous fat stores The patient meets the criteria for malnutrition based on the consensus statement from the Academy of Nutrition and Dietetics and the American Society of Parenteral and Enteral Nutrition.    Please refer to nutrition assessment dated 12/22/19 for detailed assessment/recommendation.     Present on Admission: Yes     Plan/Recommendations:    Nutrition Intervention:   1. Continue regular diet  2. Recommend Consider trial of MCT oil with  Short bowel. Can begin with 15 ml with meal, max dose 20 ml.  3. Dosing:Can provide up to 50-100 ml MCT / day given equally divided dose/meal  4. Will Add ensure supplement, ensure surgery bid as this product contains MCT (8 oz.=330 kcal, 18 g. Protein)  If agree with malnutrition diagnosis, please cosign malnutrition progress note.  Nutrition Monitoring/Evaluation:   1. Will monitor diet tolerance and intake, nutrition-related labs, weight trend, BM pattern, supplement acceptance.   2. Nutrition to follow up per high nutrition risk protocol.    Percival Spanish MS,  RDN, CDN, CSR, [facility]  Liver Transplant Dietitian  [facility] # [phone]    agree with malnutrition diagnosis, please cosign malnutrition progress note.If              Attention to provider:   By co-signing this note, I agree with the diagnosis above.  Please document the malnutrition diagnosis in the patients progress note

## 2019-12-23 LAB — CBC AND DIFFERENTIAL
Baso # K/uL: 0 10*3/uL (ref 0.0–0.1)
Basophil %: 0.2 %
Eos # K/uL: 0.3 10*3/uL (ref 0.0–0.5)
Eosinophil %: 3.6 %
Hematocrit: 33 % — ABNORMAL LOW (ref 40–51)
Hemoglobin: 11.1 g/dL — ABNORMAL LOW (ref 13.7–17.5)
IMM Granulocytes #: 0 10*3/uL (ref 0.0–0.0)
IMM Granulocytes: 0.2 %
Lymph # K/uL: 0.8 10*3/uL — ABNORMAL LOW (ref 1.3–3.6)
Lymphocyte %: 9 %
MCH: 30 pg/cell (ref 26–32)
MCHC: 33 g/dL (ref 32–37)
MCV: 90 fL (ref 79–92)
Mono # K/uL: 1.2 10*3/uL — ABNORMAL HIGH (ref 0.3–0.8)
Monocyte %: 14.2 %
Neut # K/uL: 6.1 10*3/uL — ABNORMAL HIGH (ref 1.8–5.4)
Nucl RBC # K/uL: 0 10*3/uL (ref 0.0–0.0)
Nucl RBC %: 0 /100 WBC (ref 0.0–0.2)
Platelets: 389 10*3/uL — ABNORMAL HIGH (ref 150–330)
RBC: 3.7 MIL/uL — ABNORMAL LOW (ref 4.6–6.1)
RDW: 17.7 % — ABNORMAL HIGH (ref 11.6–14.4)
Seg Neut %: 72.8 %
WBC: 8.3 10*3/uL (ref 4.2–9.1)

## 2019-12-23 LAB — COMPREHENSIVE METABOLIC PANEL
ALT: 85 U/L — ABNORMAL HIGH (ref 0–50)
AST: 130 U/L — ABNORMAL HIGH (ref 0–50)
Albumin: 3 g/dL — ABNORMAL LOW (ref 3.5–5.2)
Alk Phos: 623 U/L — ABNORMAL HIGH (ref 40–130)
Anion Gap: 14 (ref 7–16)
Bilirubin,Total: 19.6 mg/dL — ABNORMAL HIGH (ref 0.0–1.2)
CO2: 20 mmol/L (ref 20–28)
Calcium: 8.8 mg/dL — ABNORMAL LOW (ref 9.0–10.3)
Chloride: 100 mmol/L (ref 96–108)
Creatinine: 0.74 mg/dL (ref 0.67–1.17)
GFR,Black: 130 *
GFR,Caucasian: 113 *
Glucose: 101 mg/dL — ABNORMAL HIGH (ref 60–99)
Lab: 10 mg/dL (ref 6–20)
Potassium: 3.8 mmol/L (ref 3.3–5.1)
Sodium: 134 mmol/L (ref 133–145)
Total Protein: 6 g/dL — ABNORMAL LOW (ref 6.3–7.7)

## 2019-12-23 LAB — PROTIME-INR
INR: 1.5 — ABNORMAL HIGH (ref 0.9–1.1)
Protime: 16.8 s — ABNORMAL HIGH (ref 10.0–12.9)

## 2019-12-23 LAB — PHOSPHORUS: Phosphorus: 2.5 mg/dL — ABNORMAL LOW (ref 2.7–4.5)

## 2019-12-23 LAB — TISSUE TRANSGLUTAMINASE, IGG: tTG,IgG: 2 U/mL (ref 0–5)

## 2019-12-23 LAB — MAGNESIUM: Magnesium: 2.2 mg/dL (ref 1.6–2.5)

## 2019-12-23 LAB — APTT: aPTT: 62.3 s — ABNORMAL HIGH (ref 25.8–37.9)

## 2019-12-23 MED ORDER — SENNOSIDES 8.6 MG PO TABS *I*
1.0000 | ORAL_TABLET | Freq: Two times a day (BID) | ORAL | Status: DC
Start: 2019-12-23 — End: 2019-12-25
  Administered 2019-12-23 – 2019-12-25 (×4): 1 via ORAL
  Filled 2019-12-23 (×5): qty 1

## 2019-12-23 MED ORDER — HYDROMORPHONE HCL PF 1 MG/ML IJ SOLN *WRAPPED*
0.5000 mg | Freq: Once | INTRAMUSCULAR | Status: AC
Start: 2019-12-23 — End: 2019-12-23
  Administered 2019-12-23: 0.5 mg via INTRAVENOUS
  Filled 2019-12-23: qty 0.5

## 2019-12-23 MED ORDER — ALBUMIN HUMAN 5 % IV SOLN *I*
25.0000 g | Freq: Once | INTRAVENOUS | Status: AC
Start: 2019-12-23 — End: 2019-12-23
  Administered 2019-12-23: 25 g via INTRAVENOUS
  Filled 2019-12-23: qty 500

## 2019-12-23 MED ORDER — MELATONIN 3 MG PO TABS *I*
3.0000 mg | ORAL_TABLET | Freq: Once | ORAL | Status: AC
Start: 2019-12-23 — End: 2019-12-23
  Administered 2019-12-23: 3 mg via ORAL
  Filled 2019-12-23: qty 1

## 2019-12-23 MED ORDER — HYDROXYZINE HCL 10 MG PO TABS *I*
10.0000 mg | ORAL_TABLET | Freq: Once | ORAL | Status: AC
Start: 2019-12-23 — End: 2019-12-23
  Administered 2019-12-23: 10 mg via ORAL
  Filled 2019-12-23: qty 1

## 2019-12-23 MED ORDER — HYDROMORPHONE HCL PF 1 MG/ML IJ SOLN *WRAPPED*
1.0000 mg | INTRAMUSCULAR | Status: DC | PRN
Start: 2019-12-23 — End: 2019-12-25
  Administered 2019-12-23 – 2019-12-25 (×5): 1 mg via INTRAVENOUS
  Filled 2019-12-23 (×5): qty 1

## 2019-12-23 NOTE — Significant Event (Signed)
HMD APP Cross Cover Note    Following up on CT a/p results.    CT a/p final result:  1. Ill-defined, heterogeneous attenuation involving the right hepatic lobe, with focal overlying capsular retraction. This is concerning for possible cholangiocarcinoma, with abscess and metastasis less likely.      2. Sequela of known biliary PSC. Unchanged mixed density lesion in the left hepatic lobe.      2. Soft tissue thickening in the upper retroperitoneum, likely confluent adenopathy.      3. Splenomegaly of unclear etiology though this may related to developing portal hypertension.      4. Multiple focally dilated small bowel loops, likely representing developing ileus.      5. Distended gallbladder.      6. Sequela of fluid overload, with small right pleural effusion, moderate intra-abdominal ascites, and moderate periportal edema.       Assessment/Plan:    -No concern for SBO noted on CT results  -Concern for developing ileus--> will make NPO for now  -Will defer to day team for further work-up of heterogenous attenuation involving right hepatic lobe concerning for possible cholangiocarcinoma.     Velora Mediate, NP  Hospital Medicine   12/23/19  12:22 AM

## 2019-12-23 NOTE — Progress Notes (Addendum)
Transplant Hepatology Follow up    Admit Date:  12/20/2019    Interval Events:   Unable to obtain MRCP/MRI abd - CT obtained instead  CT findings concerning for developing ileus without signs of SBO and ill-defined heterogenous attenuation of right hepatic lobe w/ overlying capsular retraction concerning for cholangiocarcinoma w/ abscess    Subjective:   Feeling frustrated and tired - states he has had multiple scans done and doesn't feel as if he has any answers as to why he is having this chronic abdominal pain.  Denies fevers/chills, N/V, bloody or black tarry stools, body aches, sore throat, dysuria, or CP/SOB.    Medications:    Scheduled Meds:   medium chain triglycerides  15 mL Oral TID    gadoteridol  14 mL Intravenous Once    calcium-vitamin D  1 tablet Oral QAM    cholecalciferol  2,000 unit Oral QAM    docusate sodium  100 mg Oral 2 times per day    senna  1 tablet Oral Nightly    vitamins A-D-E-K  1 tablet Oral Daily    mesalamine  4.8 g Oral Daily with breakfast    piperacillin-tazobactam  4.5 g Intravenous Q8H     Continuous Infusions:   heparin 1,150 Units/hr (12/23/19 0646)     PRN Meds:.perflutren protein A microsph, HYDROmorphone, acetaminophen, traMADol, morphine *PF*, heparin, sodium chloride, dextrose, hyoscyamine    Physical examination:      BP 100/70    Pulse 82    Temp 37.1 C (98.8 F) (Oral)    Resp 18    Ht 1.753 m (5' 9.02")    Wt 53.5 kg (117 lb 15.1 oz)    SpO2 100%    BMI 17.41 kg/m   General: NAD  Skin: +jaundice  HEENT: Sclera icteric  Heart:: Positive S1/S2, RRR, no m/r/gs  Lungs: CTAB  Abdomen: Normoactive bowel sounds. Soft, non-tender, and non-distended.  No guarding or rebound tenderness  Vascular: No LE edema bilaterally  Neurologic: A&O x3, no asterixis present     LAB DATA:         Lab results: 12/23/19  0128 12/22/19  0226 12/21/19  0450 12/20/19  2200 11/02/19  1238   WBC 8.3 8.8 7.4 7.8 5.23   Hemoglobin 11.1* 12.7* 12.0* 11.2* 17.0   Hematocrit 33* 38* 36* 33*  51.20   RBC 3.7* 4.2* 4.0* 3.7* 5.56   Platelets 389* 440* 438* 428* 236           Lab results: 12/23/19  0128 12/22/19  0226 12/21/19  0450 12/20/19  2200   Sodium 134 136 133 134   Potassium 3.8 3.9 3.8 3.7   Chloride 100 99 97 98   CO2 20 22 24 23    UN 10 11 9 10    Creatinine 0.74 0.78 0.76 0.72   GFR,Caucasian 113 110 112 114   GFR,Black 130 128 129 132   Glucose 101* 123* 97 97   Calcium 8.8* 9.4 9.3 8.8*   Total Protein 6.0* 6.6 6.5 6.0*   Albumin 3.0* 3.3* 3.3* 3.1*   ALT 85* 86* 82* 74*   AST 130* 137* 125* 107*   Alk Phos 623* 681* 682* 616*   Bilirubin,Total 19.6* 21.1* 21.3* 18.8*         Lab results: 12/23/19  0128   INR 1.5*     IMAGING:   CT A/P w/ contrast on 12/18/19: increasing abdominal ascites w/ continued interval distension of gallbladder and underlying peritonitis  cannot be excluded. Stable heterogeneity throughout R hepatic lobe which remains nonspecific and underlying infectious etiology vs malignancy remains on differential. Medium R-0sided pleural effusion w/ subsegmental atelectasis vs PNA    CTA Chest on 12/12/19: R lower lobe segmental and subsegmental pulmonary emobli. Anterior cardiophrenic adenopathy, reactive vs malignant.     MRI Abdomen w/ and w/out contrast 12/12/19: lobulated contour of liver; unchanged cyst w/ layering stones in left hepatic lobe that may represent biliary cyst. Areas of hypoenhancement in R hepatic lobe in the distribution of the portal triads. May represent liver abscess. Gallbladder is distended w/ GB wall edema. No biliary ductal dilation. No gallstones seen. Small amount of RUQ free fluid. Upper abdominal and retroperitoneal lymph nodes, likely reactive     HIDA 12/20/19: activity remains within the hepatic parenchyma. Findings most consistent with hepatocellular dysfunction. There is no biliary ductal visualization.    Ct Abdomen And Pelvis With Iv Contrast    Result Date: 12/22/2019  1. Ill-defined, heterogeneous attenuation involving the right hepatic  lobe, with focal overlying capsular retraction. This is concerning for possible cholangiocarcinoma, with abscess and metastasis less likely. 2. Sequela of known biliary PSC. Unchanged mixed density lesion in the left hepatic lobe. 2. Soft tissue thickening in the upper retroperitoneum, likely confluent adenopathy. 3. Splenomegaly of unclear etiology though this may related to developing portal hypertension. 4. Multiple focally dilated small bowel loops, likely representing developing ileus. 5. Distended gallbladder. 6. Sequela of fluid overload, with small right pleural effusion, moderate intra-abdominal ascites, and moderate periportal edema. END OF IMPRESSION I have personally reviewed the images and the Resident's/Fellow's interpretation and agree with or edited the findings. [facility] submits this DICOM format image data and final report to the [facility], an independent secure electronic health information exchange, on a reciprocally searchable basis (with patient authorization) for a minimum of 12 months after exam date.    Mr Abdomen Without Contrast &  Mrcp With 3d Render    Result Date: 12/22/2019  Suboptimal evaluation due to patient's difficulty with complete the study due to pain. The patient may return for the remaining images when it is clinically appropriate. 1. Distended small bowel loops with small amount of ascites, incompletely evaluated on this exam. This may represent small bowel obstruction or ileus. Recommend further evaluation with CT abdomen and pelvis. 2. Sequela of known PSC, grossly similar to prior exams. 3. Redemonstration of the segment 2 cystic lesion with layering debris, grossly stable since prior exams. Findings discussed with the [facility] by Dr. Kirke Shaggy of radiology as documented in the primordial system. END OF IMPRESSION I have personally reviewed the images and the Resident's/Fellow's interpretation and agree with or edited the findings. [facility] submits this DICOM  format image data and final report to the [facility], an independent secure electronic health information exchange, on a reciprocally searchable basis (with patient authorization) for a minimum of 12 months after exam date.    I have personally reviewed the above imaging data and agree with the findings as noted by Radiology    ASSESSMENT/RECOMMENDATIONS:   44 yo M with PMHx [place] and UC s/p total colectomy w/ J pouch creation in 2010 who was transferred from OSH for further management of obstructive jaundice/worsening liver disease.  Presented at OSH with complaints of increasing abd pain, jaundice, and darkened urine.  His OSH course was notable for removal of previously placed CBD stent (12/04/19 as an outpatient) and was c/b new onset DVT/PE and he was  started on a heparin drip.  Hospital course here notable for ileus and concerning ill-defined heterogenous attenuation of right hepatic lobe on CT scan.    1) PSC/obstructive jaundice/concerns for acute cholangitis/new ill-defined heterogenous attenuation of right hepatic lobe - ERCP 09/22/19 w/ mild diffuse narrowing of common hepatic duct, no definite stricture noted, diffuse scarring of CBD and CHD, spyglass/brush biopsies performed  - Follow-up blood cultures. Continue Zosyn empirically  - MELD 24 today, blood group O, was listed yesterday for liver transplantation  - Repeat MRCP/MRI suboptimal w/ ?ileus vs SBO, CT scan ordered and completed overnight c/w ileus and concerning area of right hepatic lobe - will ask transplant surgery to review CT scan findings and make recommendations   - Brush biopsies in 08/2019 c/w high amounts of inflammation which can mimic low grade dysplasia   - Will obtain repeat biopsies obtained from ERCP earlier this month at [facility]  - Held calcium/vitamin D supplementations in setting of ileus to minimize PO, restart as able    2) Ileus  - NPO for now - remains without any N/V at this time  - Minimize PO pills to only what is  necessary  - IV dilaudid for pain control - educated on using sparingly as can worsen ileus  - Encouraged frequent ambulation  - Colace/senna BID, consider miralax  - Albumin 5% 25g x1 for volume  - Will discuss with attending whether OK to start on CLD this afternoon, if to remain NPO will place on mIVF    3) DVT/PE - unclear etiology at this time  - Continue heparin gtt for now pending any procedures  - Echo exam begun, will await report    4) Steatorrhea and malnutrition  - Nutrition consult - MCT oil supplementation recommended but on hold for now given NPO - will resume once tolerating PO  - IgA level WNL, pending tissue transglutaminase IgG, and fecal elastase  - ADEK supplementation on hold 2/2 ileus to minimize PO, will resume as able    5) Chronic stable medical conditions  - UC s/p total colectomy w/ Jpouch creation 2010 - continue home mesalamine, hyoscyamine added for cramping, tramadol and PO hydromorphone.    - Will need to consider adding gabapentin to help taper off/avoid all narcotics, and given patient with enteral access but will defer for now given ileus    F: Albumin 5% 25g x1  E: daily labs, replete PRN  N: NPO     DVT ppx: heparin gtt as above  Bowel reg: colace/senna BID, consider miralax   PUD ppx: will add IV protonix given ileus and likely more prolonged stay     Dispo: pending clinical course     Welford Roche, NP  Gastroenterology/Hepatology  [facility] [phone]      ATTENDING PHYSICIAN ASSESSMENT AND PLAN      The patient was reviewed in collaboration with the nurse practitioner.  This case summary is meant to be complementary to that assessment. Patient was discussed an seed on service rounds with a detailed chart review.    Kyle Bright is a  44 y.o. male with PSC    Complete abdominal exam reveals tenderness in RUQ and across lower abdomen as well as jaundice    I personally reviewed available laboratory investigations. MELD-Na score: 24 at 12/23/2019  1:28 AM  MELD score: 22 at  12/23/2019  1:28 AM  Calculated from:  Serum Creatinine: 0.74 mg/dL (Rounded to 1 mg/dL) at 12/23/2019  1:28 AM  Serum Sodium: 134  mmol/L at 12/23/2019  1:28 AM  Total Bilirubin: 19.6 mg/dL at 12/23/2019  1:28 AM  INR(ratio): 1.5 at 12/23/2019  1:28 AM  Age: 50 years 2 months  .    I personally reviewed all available diagnostic imaging reports.  I also personally reviewed the images of his MRI/CT from yesterday.    IMPRESSION AND PLAN:  Albion Weatherholtz is a 44 y.o. male with PSC and icteric cholestasis. CT scan suspicious for infiltrative cholangiocarcinoma. Planning for biopsy come Monday.    Further assessment and plan as documented above.      Open notes: Please note all efforts are made to assure accuracy of this note. Mistakes can be made as we do not have scribes and real time note taking. Information detailed in the note was taken from direct patient interviews as well as chart review. If you find something important you wish corrected in the record please let us know.            Dr. Orie Fisherman. Gala Murdoch, MD.CM, [place] (Int Med, GI)  Hepatologist, Solid Organ Transplant Program  Associate Professor of [place] of [facility]

## 2019-12-23 NOTE — Progress Notes (Signed)
Transplant Surgery Brief Note    Patient's CT reviewed with Dr. Jerilee Hoh.  Agree with radiology findings, CT demonstrates progression of capsule retractment concerning for new malignancy when compare to CT from several months prior. Recommendations as follows:    - Recommend obtaining image guided biopsy of possible lesion in right lobe of liver.  - Obtain updated CA19-9  - As this patient is currently listed, will place on internal hold.  - Further discussion to be carried out in multidisciplinary meetings on Monday morning.    Please call with questions.    Rolene Arbour, MD

## 2019-12-24 ENCOUNTER — Inpatient Hospital Stay: Payer: PRIVATE HEALTH INSURANCE

## 2019-12-24 ENCOUNTER — Encounter: Payer: Self-pay | Admitting: Gastroenterology

## 2019-12-24 DIAGNOSIS — K567 Ileus, unspecified: Secondary | ICD-10-CM

## 2019-12-24 LAB — CBC AND DIFFERENTIAL
Baso # K/uL: 0 10*3/uL (ref 0.0–0.1)
Basophil %: 0.2 %
Eos # K/uL: 0.2 10*3/uL (ref 0.0–0.5)
Eosinophil %: 2.4 %
Hematocrit: 31 % — ABNORMAL LOW (ref 40–51)
Hemoglobin: 10.7 g/dL — ABNORMAL LOW (ref 13.7–17.5)
IMM Granulocytes #: 0 10*3/uL (ref 0.0–0.0)
IMM Granulocytes: 0.3 %
Lymph # K/uL: 0.8 10*3/uL — ABNORMAL LOW (ref 1.3–3.6)
Lymphocyte %: 9.5 %
MCH: 30 pg/cell (ref 26–32)
MCHC: 34 g/dL (ref 32–37)
MCV: 89 fL (ref 79–92)
Mono # K/uL: 1.2 10*3/uL — ABNORMAL HIGH (ref 0.3–0.8)
Monocyte %: 13.3 %
Neut # K/uL: 6.6 10*3/uL — ABNORMAL HIGH (ref 1.8–5.4)
Nucl RBC # K/uL: 0 10*3/uL (ref 0.0–0.0)
Nucl RBC %: 0 /100 WBC (ref 0.0–0.2)
Platelets: 367 10*3/uL — ABNORMAL HIGH (ref 150–330)
RBC: 3.5 MIL/uL — ABNORMAL LOW (ref 4.6–6.1)
RDW: 17.9 % — ABNORMAL HIGH (ref 11.6–14.4)
Seg Neut %: 74.3 %
WBC: 8.8 10*3/uL (ref 4.2–9.1)

## 2019-12-24 LAB — MAGNESIUM: Magnesium: 2.2 mg/dL (ref 1.6–2.5)

## 2019-12-24 LAB — COMPREHENSIVE METABOLIC PANEL
ALT: 85 U/L — ABNORMAL HIGH (ref 0–50)
AST: 139 U/L — ABNORMAL HIGH (ref 0–50)
Albumin: 3.2 g/dL — ABNORMAL LOW (ref 3.5–5.2)
Alk Phos: 635 U/L — ABNORMAL HIGH (ref 40–130)
Anion Gap: 13 (ref 7–16)
Bilirubin,Total: 21.4 mg/dL — ABNORMAL HIGH (ref 0.0–1.2)
CO2: 22 mmol/L (ref 20–28)
Calcium: 9.2 mg/dL (ref 9.0–10.3)
Chloride: 96 mmol/L (ref 96–108)
Creatinine: 0.67 mg/dL (ref 0.67–1.17)
GFR,Black: 136 *
GFR,Caucasian: 118 *
Glucose: 110 mg/dL — ABNORMAL HIGH (ref 60–99)
Lab: 8 mg/dL (ref 6–20)
Potassium: 3.6 mmol/L (ref 3.3–5.1)
Sodium: 131 mmol/L — ABNORMAL LOW (ref 133–145)
Total Protein: 6 g/dL — ABNORMAL LOW (ref 6.3–7.7)

## 2019-12-24 LAB — APTT
aPTT: 54.7 s — ABNORMAL HIGH (ref 25.8–37.9)
aPTT: 107.1 s — ABNORMAL HIGH (ref 25.8–37.9)
aPTT: 89 s — ABNORMAL HIGH (ref 25.8–37.9)

## 2019-12-24 LAB — CA 19 9 (EFF. 01-2011): CA 19 9 (eff. 01-2011): 58800 U/mL — ABNORMAL HIGH (ref 0–35)

## 2019-12-24 LAB — PROTIME-INR
INR: 1.8 — ABNORMAL HIGH (ref 0.9–1.1)
Protime: 20.6 s — ABNORMAL HIGH (ref 10.0–12.9)

## 2019-12-24 LAB — TYPE AND SCREEN
ABO RH Blood Type: O POS
Antibody Screen: NEGATIVE

## 2019-12-24 LAB — PHOSPHORUS: Phosphorus: 2.3 mg/dL — ABNORMAL LOW (ref 2.7–4.5)

## 2019-12-24 MED ORDER — PANTOPRAZOLE SODIUM 40 MG IV SOLR *I*
40.0000 mg | INTRAVENOUS | Status: DC
Start: 2019-12-24 — End: 2019-12-26
  Administered 2019-12-24 – 2019-12-26 (×3): 40 mg via INTRAVENOUS
  Filled 2019-12-24 (×3): qty 10

## 2019-12-24 MED ORDER — SODIUM CHLORIDE 0.9 % IV SOLN WRAPPED *I*
75.0000 mL/h | Status: DC
Start: 2019-12-25 — End: 2019-12-25
  Administered 2019-12-25: 75 mL/h via INTRAVENOUS

## 2019-12-24 NOTE — Progress Notes (Addendum)
Transplant Hepatology Follow up    Admit Date:  12/20/2019    Interval Events:   NAE overnight - CT scan reviewed w/ transplant surgery, plan for biopsy Monday    Subjective:   Feels a little better today.  States he slept overnight and his pain seems to be more well controlled with the dilaudid.  Anxious to have the biopsy and obtain the results of that.  Tolerating a clear liquid diet well.  Denies fevers/chills, increased bloating, N/V, bloody or black tarry stools, body aches, sore throat, dysuria, or CP/SOB.    Medications:    Scheduled Meds:   senna  1 tablet Oral 2 times per day    docusate sodium  100 mg Oral 2 times per day    mesalamine  4.8 g Oral Daily with breakfast    piperacillin-tazobactam  4.5 g Intravenous Q8H     Continuous Infusions:   heparin 1,250 Units/hr (12/24/19 0351)     PRN Meds:.HYDROmorphone hcl, acetaminophen, heparin, sodium chloride, dextrose, hyoscyamine    Physical examination:      BP 114/66 (BP Location: Right arm)    Pulse 93    Temp 37.7 C (99.9 F) (Oral)    Resp 20    Ht 1.753 m (5' 9.02")    Wt 53.2 kg (117 lb 3.2 oz)    SpO2 97%    BMI 17.30 kg/m   General: NAD  Skin: +jaundice  HEENT: Sclera icteric  Heart:: Positive S1/S2, RRR, no m/r/gs  Lungs: CTAB  Abdomen: Normoactive bowel sounds. Soft, non-tender, and non-distended.  No guarding or rebound tenderness  Vascular: No LE edema bilaterally  Neurologic: A&O x3, no asterixis present     LAB DATA:         Lab results: 12/24/19  0214 12/23/19  0128 12/22/19  0226 12/21/19  0450 12/20/19  2200   WBC 8.8 8.3 8.8 7.4 7.8   Hemoglobin 10.7* 11.1* 12.7* 12.0* 11.2*   Hematocrit 31* 33* 38* 36* 33*   RBC 3.5* 3.7* 4.2* 4.0* 3.7*   Platelets 367* 389* 440* 438* 428*           Lab results: 12/24/19  0214 12/23/19  0128 12/22/19  0226 12/21/19  0450 12/20/19  2200   Sodium 131* 134 136 133 134   Potassium 3.6 3.8 3.9 3.8 3.7   Chloride 96 100 99 97 98   CO2 22 20 22 24 23    UN 8 10 11 9 10    Creatinine 0.67 0.74 0.78 0.76 0.72    GFR,Caucasian 118 113 110 112 114   GFR,Black 136 130 128 129 132   Glucose 110* 101* 123* 97 97   Calcium 9.2 8.8* 9.4 9.3 8.8*   Total Protein 6.0* 6.0* 6.6 6.5 6.0*   Albumin 3.2* 3.0* 3.3* 3.3* 3.1*   ALT 85* 85* 86* 82* 74*   AST 139* 130* 137* 125* 107*   Alk Phos 635* 623* 681* 682* 616*   Bilirubin,Total 21.4* 19.6* 21.1* 21.3* 18.8*         Lab results: 12/24/19  0214   INR 1.8*     IMAGING:   CT A/P w/ contrast on 12/18/19: increasing abdominal ascites w/ continued interval distension of gallbladder and underlying peritonitis cannot be excluded. Stable heterogeneity throughout R hepatic lobe which remains nonspecific and underlying infectious etiology vs malignancy remains on differential. Medium R-0sided pleural effusion w/ subsegmental atelectasis vs PNA    CTA Chest on 12/12/19: R lower lobe segmental  and subsegmental pulmonary emobli. Anterior cardiophrenic adenopathy, reactive vs malignant.     MRI Abdomen w/ and w/out contrast 12/12/19: lobulated contour of liver; unchanged cyst w/ layering stones in left hepatic lobe that may represent biliary cyst. Areas of hypoenhancement in R hepatic lobe in the distribution of the portal triads. May represent liver abscess. Gallbladder is distended w/ GB wall edema. No biliary ductal dilation. No gallstones seen. Small amount of RUQ free fluid. Upper abdominal and retroperitoneal lymph nodes, likely reactive     HIDA 12/20/19: activity remains within the hepatic parenchyma. Findings most consistent with hepatocellular dysfunction. There is no biliary ductal visualization.    Ct Abdomen And Pelvis With Iv Contrast    Result Date: 12/22/2019  1. Ill-defined, heterogeneous attenuation involving the right hepatic lobe, with focal overlying capsular retraction. This is concerning for possible cholangiocarcinoma, with abscess and metastasis less likely. 2. Sequela of known biliary PSC. Unchanged mixed density lesion in the left hepatic lobe. 2. Soft tissue thickening  in the upper retroperitoneum, likely confluent adenopathy. 3. Splenomegaly of unclear etiology though this may related to developing portal hypertension. 4. Multiple focally dilated small bowel loops, likely representing developing ileus. 5. Distended gallbladder. 6. Sequela of fluid overload, with small right pleural effusion, moderate intra-abdominal ascites, and moderate periportal edema. END OF IMPRESSION I have personally reviewed the images and the Resident's/Fellow's interpretation and agree with or edited the findings. [facility] submits this DICOM format image data and final report to the [facility], an independent secure electronic health information exchange, on a reciprocally searchable basis (with patient authorization) for a minimum of 12 months after exam date.    Mr Abdomen Without Contrast &  Mrcp With 3d Render    Result Date: 12/22/2019  Suboptimal evaluation due to patient's difficulty with complete the study due to pain. The patient may return for the remaining images when it is clinically appropriate. 1. Distended small bowel loops with small amount of ascites, incompletely evaluated on this exam. This may represent small bowel obstruction or ileus. Recommend further evaluation with CT abdomen and pelvis. 2. Sequela of known PSC, grossly similar to prior exams. 3. Redemonstration of the segment 2 cystic lesion with layering debris, grossly stable since prior exams. Findings discussed with the [facility] by Dr. Kirke Shaggy of radiology as documented in the primordial system. END OF IMPRESSION I have personally reviewed the images and the Resident's/Fellow's interpretation and agree with or edited the findings. [facility] submits this DICOM format image data and final report to the [facility], an independent secure electronic health information exchange, on a reciprocally searchable basis (with patient authorization) for a minimum of 12 months after exam date.    I have personally  reviewed the above imaging data and agree with the findings as noted by Radiology    ASSESSMENT/RECOMMENDATIONS:   44 yo M with PMHx [place] and UC s/p total colectomy w/ J pouch creation in 2010 who was transferred from OSH for further management of obstructive jaundice/worsening liver disease.  Presented at OSH with complaints of increasing abd pain, jaundice, and darkened urine.  His OSH course was notable for removal of previously placed CBD stent (12/04/19 as an outpatient) and was c/b new onset DVT/PE and he was started on a heparin drip.  Hospital course here notable for ileus and concerning ill-defined heterogenous attenuation of right hepatic lobe on CT scan.    1) PSC/obstructive jaundice/concerns for acute cholangitis/new ill-defined heterogenous attenuation of right hepatic lobe -  ERCP 09/22/19 w/ mild diffuse narrowing of common hepatic duct, no definite stricture noted, diffuse scarring of CBD and CHD, spyglass/brush biopsies performed  - Follow-up blood cultures. Continue Zosyn empirically  - MELD up to 28 today, blood group O, holding off on listing due to concerns for malignancy  - Repeat MRCP/MRI suboptimal w/ ?ileus vs SBO, CT scan ordered and completed c/w ileus and concerning area of right hepatic lobe - was reviewed by transplant surgery - will plan for liver biopsy Monday in IR  - Held calcium/vitamin D supplementations in setting of ileus to minimize PO, restart as able    2) Ileus  - Advanced to CLD yesterday afternoon - no issues  - Repeat KUB ordered for this AM - if signs of improvement will consider full liquid diet  - Minimize PO pills to only what is necessary  - IV dilaudid for pain control - educated on using sparingly as can worsen ileus  - Encouraged frequent ambulation  - Colace/senna BID, consider miralax when able per patient request  - Will d/c hyoscyamine     3) DVT/PE - unclear etiology at this time  - Continue heparin gtt for now pending any procedures  - Echo benign  - Will  need to clarify long term anticoagulation plan    4) Steatorrhea and malnutrition  - Nutrition consult - MCT oil supplementation recommended but on hold for now given CLD only - will resume once tolerating PO  - IgA level WNL, pending tissue transglutaminase IgG, and fecal elastase  - ADEK supplementation on hold 2/2 ileus to minimize PO, will resume as able    5) Chronic stable medical conditions  - UC s/p total colectomy w/ Jpouch creation 2010 - continue home mesalamine      F: PO  E: daily labs, replete PRN  N: CLD    DVT ppx: heparin gtt as above  Bowel reg: colace/senna BID, consider miralax   PUD ppx: IV PPI     Dispo: pending clinical course     Welford Roche, NP  Gastroenterology/Hepatology  [facility] [phone]      ATTENDING PHYSICIAN ASSESSMENT AND PLAN      The patient was reviewed in collaboration with the nurse practitioner.  This case summary is meant to be complementary to that assessment. Patient was discussed an seed on service rounds with a detailed chart review.    Kyle Bright is a  44 y.o. male with PSC    Complete abdominal exam reveals tenderness in RUQ and across lower abdomen as well as jaundice    I personally reviewed available laboratory investigations.   MELD-Na score: 28 at 12/24/2019  2:14 AM  MELD score: 25 at 12/24/2019  2:14 AM  Calculated from:  Serum Creatinine: 0.67 mg/dL (Rounded to 1 mg/dL) at 12/24/2019  2:14 AM  Serum Sodium: 131 mmol/L at 12/24/2019  2:14 AM  Total Bilirubin: 21.4 mg/dL at 12/24/2019  2:14 AM  INR(ratio): 1.8 at 12/24/2019  2:14 AM  Age: 66 years 2 months      I personally reviewed all available diagnostic imaging reports.  I also personally reviewed the images of his MRI/CT from yesterday.    IMPRESSION AND PLAN:  Kyle Bright is a 44 y.o. male with PSC and icteric cholestasis. CT scan suspicious for infiltrative cholangiocarcinoma. Planning for biopsy come Monday. Ca 19-9 58000 c/w cholangioCA.    Further assessment and plan as documented  above.      Open notes: Please note all efforts  are made to assure accuracy of this note. Mistakes can be made as we do not have scribes and real time note taking. Information detailed in the note was taken from direct patient interviews as well as chart review. If you find something important you wish corrected in the record please let us know.            Dr. Orie Fisherman. Gala Murdoch, MD.CM, [place] (Int Med, GI)  Hepatologist, Solid Organ Transplant Program  Associate Professor of [place] of [facility]

## 2019-12-25 DIAGNOSIS — K7689 Other specified diseases of liver: Secondary | ICD-10-CM

## 2019-12-25 DIAGNOSIS — C221 Intrahepatic bile duct carcinoma: Secondary | ICD-10-CM

## 2019-12-25 DIAGNOSIS — I2699 Other pulmonary embolism without acute cor pulmonale: Secondary | ICD-10-CM

## 2019-12-25 DIAGNOSIS — R160 Hepatomegaly, not elsewhere classified: Secondary | ICD-10-CM

## 2019-12-25 LAB — HCT AND HGB
Hematocrit: 29 % — ABNORMAL LOW (ref 40–51)
Hemoglobin: 9.7 g/dL — ABNORMAL LOW (ref 13.7–17.5)

## 2019-12-25 LAB — COMPREHENSIVE METABOLIC PANEL
ALT: 84 U/L — ABNORMAL HIGH (ref 0–50)
AST: 135 U/L — ABNORMAL HIGH (ref 0–50)
Albumin: 3 g/dL — ABNORMAL LOW (ref 3.5–5.2)
Alk Phos: 610 U/L — ABNORMAL HIGH (ref 40–130)
Anion Gap: 11 (ref 7–16)
Bilirubin,Total: 20.9 mg/dL — ABNORMAL HIGH (ref 0.0–1.2)
CO2: 24 mmol/L (ref 20–28)
Calcium: 8.9 mg/dL — ABNORMAL LOW (ref 9.0–10.3)
Chloride: 100 mmol/L (ref 96–108)
Creatinine: 0.69 mg/dL (ref 0.67–1.17)
GFR,Black: 134 *
GFR,Caucasian: 116 *
Glucose: 94 mg/dL (ref 60–99)
Lab: 9 mg/dL (ref 6–20)
Potassium: 3.6 mmol/L (ref 3.3–5.1)
Sodium: 135 mmol/L (ref 133–145)
Total Protein: 5.7 g/dL — ABNORMAL LOW (ref 6.3–7.7)

## 2019-12-25 LAB — HEPARINASE TEG THROMBELASTOGRAPH
Heparinase A Angle: 65.2 deg (ref 53.0–73.0)
Heparinase Coagulation Index: -1.4 (ref <=3.0)
Heparinase K Time: 2 min (ref 1.0–3.0)
Heparinase LY30: 0.6 % (ref 0.0–8.0)
Heparinase Maximum Amplitude: 79.1 mm — ABNORMAL HIGH (ref 50.0–72.0)
Heparinase R Time: 11.4 min — ABNORMAL HIGH (ref 4.0–10.0)

## 2019-12-25 LAB — TEG REVIEW

## 2019-12-25 LAB — CBC AND DIFFERENTIAL
Baso # K/uL: 0 10*3/uL (ref 0.0–0.1)
Basophil %: 0.4 %
Eos # K/uL: 0.2 10*3/uL (ref 0.0–0.5)
Eosinophil %: 3.3 %
Hematocrit: 29 % — ABNORMAL LOW (ref 40–51)
Hemoglobin: 10 g/dL — ABNORMAL LOW (ref 13.7–17.5)
IMM Granulocytes #: 0 10*3/uL (ref 0.0–0.0)
IMM Granulocytes: 0.4 %
Lymph # K/uL: 0.7 10*3/uL — ABNORMAL LOW (ref 1.3–3.6)
Lymphocyte %: 10.6 %
MCH: 31 pg/cell (ref 26–32)
MCHC: 34 g/dL (ref 32–37)
MCV: 90 fL (ref 79–92)
Mono # K/uL: 1.1 10*3/uL — ABNORMAL HIGH (ref 0.3–0.8)
Monocyte %: 15.1 %
Neut # K/uL: 4.9 10*3/uL (ref 1.8–5.4)
Nucl RBC # K/uL: 0 10*3/uL (ref 0.0–0.0)
Nucl RBC %: 0 /100 WBC (ref 0.0–0.2)
Platelets: 337 10*3/uL — ABNORMAL HIGH (ref 150–330)
RBC: 3.3 MIL/uL — ABNORMAL LOW (ref 4.6–6.1)
RDW: 18.1 % — ABNORMAL HIGH (ref 11.6–14.4)
Seg Neut %: 70.2 %
WBC: 7 10*3/uL (ref 4.2–9.1)

## 2019-12-25 LAB — MAGNESIUM: Magnesium: 2.3 mg/dL (ref 1.6–2.5)

## 2019-12-25 LAB — PROTIME-INR
INR: 2.2 — ABNORMAL HIGH (ref 0.9–1.1)
Protime: 25.6 s — ABNORMAL HIGH (ref 10.0–12.9)

## 2019-12-25 LAB — APTT
aPTT: 41.9 s — ABNORMAL HIGH (ref 25.8–37.9)
aPTT: 83.1 s — ABNORMAL HIGH (ref 25.8–37.9)

## 2019-12-25 LAB — INTERPRETATION, TEG

## 2019-12-25 LAB — MCHC: MCHC: 33 g/dL (ref 32–37)

## 2019-12-25 LAB — PHOSPHORUS: Phosphorus: 2.5 mg/dL — ABNORMAL LOW (ref 2.7–4.5)

## 2019-12-25 LAB — REVIEWD BY:

## 2019-12-25 LAB — HOLD LAVENDER

## 2019-12-25 MED ORDER — ACETAMINOPHEN 325 MG PO TABS *I*
650.0000 mg | ORAL_TABLET | Freq: Two times a day (BID) | ORAL | Status: DC
Start: 2019-12-25 — End: 2020-01-05
  Administered 2019-12-25 – 2020-01-05 (×22): 650 mg via ORAL
  Filled 2019-12-25 (×22): qty 2

## 2019-12-25 MED ORDER — SODIUM CHLORIDE 0.9 % IV SOLN WRAPPED *I*
3.0000 mL/h | Status: DC
Start: 2019-12-25 — End: 2019-12-26

## 2019-12-25 MED ORDER — SODIUM CHLORIDE 0.9 % 100 ML IV SOLN *I*
10.0000 mg | Freq: Once | INTRAVENOUS | Status: AC
Start: 2019-12-25 — End: 2019-12-25
  Administered 2019-12-25: 10 mg via INTRAVENOUS
  Filled 2019-12-25: qty 1

## 2019-12-25 MED ORDER — MELATONIN 3 MG PO TABS *I*
3.0000 mg | ORAL_TABLET | Freq: Once | ORAL | Status: AC | PRN
Start: 2019-12-25 — End: 2019-12-25

## 2019-12-25 MED ORDER — GABAPENTIN 300 MG PO CAPSULE *I*
300.0000 mg | ORAL_CAPSULE | Freq: Every evening | ORAL | Status: DC
Start: 2019-12-25 — End: 2019-12-26
  Administered 2019-12-25: 300 mg via ORAL
  Filled 2019-12-25 (×2): qty 1

## 2019-12-25 MED ORDER — POLYETHYLENE GLYCOL 3350 PO PACK 17 GM *I*
17.0000 g | PACK | Freq: Every day | ORAL | Status: DC
Start: 2019-12-25 — End: 2019-12-26
  Administered 2019-12-25: 17 g via ORAL
  Filled 2019-12-25 (×2): qty 17

## 2019-12-25 MED ORDER — HYDROMORPHONE HCL 2 MG PO TABS *I*
2.0000 mg | ORAL_TABLET | ORAL | Status: DC | PRN
Start: 2019-12-25 — End: 2019-12-26
  Administered 2019-12-26: 2 mg via ORAL
  Filled 2019-12-25: qty 1

## 2019-12-25 MED ORDER — HYDROMORPHONE HCL 2 MG PO TABS *I*
1.0000 mg | ORAL_TABLET | ORAL | Status: DC | PRN
Start: 2019-12-25 — End: 2019-12-26
  Administered 2019-12-26: 1 mg via ORAL
  Filled 2019-12-25: qty 1

## 2019-12-25 MED ORDER — SODIUM CHLORIDE 0.9 % IV SOLN WRAPPED *I*
75.0000 mL/h | Status: AC
Start: 2019-12-26 — End: 2019-12-26
  Administered 2019-12-26: 75 mL/h via INTRAVENOUS

## 2019-12-25 MED ORDER — HYDROXYZINE HCL 10 MG PO TABS *I*
10.0000 mg | ORAL_TABLET | Freq: Three times a day (TID) | ORAL | Status: DC | PRN
Start: 2019-12-26 — End: 2020-01-01
  Administered 2019-12-25 – 2019-12-27 (×2): 10 mg via ORAL
  Filled 2019-12-25 (×2): qty 1

## 2019-12-25 NOTE — Progress Notes (Signed)
Patient's chart reviewed for discharge planning needs. [facility] will continue to be available to coordinate discharge.    Latorria Zeoli, RN-BC  Acute Care Coordinator [id number]  Phone # [phone]  Pager # [phone]  Weekend Pager # [phone]

## 2019-12-25 NOTE — Provider Consult (Signed)
Brief Blood Bank Note:  ?  We were asked to evaluate a request for 1 unit of FFP based on an INR of 2.2 for a patient with suspected cholangiocarcinoma that is going for a liver biopsy today. We were informed the patient was not actively bleeding but that the elevated INR was cause for concern.  We recommend, based upon randomized trials, primarily assessing hemostasis in bleeding patients using TEG.  Patients not actively bleeding do not benefit from replacement of plasma, cryoprecipitate or platelets in general when based only upon laboratory values, with perhaps the exception of platelet counts under 10,000.  Based on the most recent HTEG and an R time that is slightly elevated at 11.4, it is unlikely that a single unit of FFP would be beneficial and might cause harm (lung injury, congestive heart failure, nosocomial infection, etc.). We recommend against prophylactic use of plasma at this particular moment in the patients course.

## 2019-12-25 NOTE — Progress Notes (Signed)
HMD APP crosscover:    CTSP for increased bloody bowel movements.     Reports that he has had frequent bloody stools. Said his stools started as normal consistency with some flecks of blood in them and have progressed to straight blood tonight. He reports that he had blood stools about every 10 min for the last 40 min or so. He had not have bowel like this before. Had some hx of bloody stools when he had a prep for a colonoscopy and he needed to have it cauterized. He denies any fever, chills, nausea, emesis, dizziness, chest pain, palpitations. Reports abdominal pain in his RUQ, Right lower quadrant, and some left sided abdominal pain. Pain is better after medications. He said that he feels "gurgling" and can feel the blood going through from up higher in his stomach.       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     Spoke with Dr. Kalman Drape regarding patient- said Hold heparin for 2 hours  If cont to bleed hold heparin gtt and get another HCT  - verbally consented to blood  - NPO now  - d/w pt and RN    Langston Masker, NP  10:49 PM  12/25/2019

## 2019-12-25 NOTE — Progress Notes (Addendum)
Transplant Hepatology Follow up    Admit Date:  12/20/2019    Interval Events:   Plan for liver biopsy with IR today  NPO overnight    Subjective:   Patient continues with abdominal pain in the epigastric/peri-umbilical region and is using IV Dilaudid for pain control. These episodes of pain are mostly at night, around 10pm. It is a struggle for him to sleep during these episodes. He has BMs throughout the day, and says the pain may also be associated with BMs. He has been having liquid BMs. No blood.     Patient is understandably upset that he may not be a transplant candidate now and with terminal cholangiocarcinoma. We discussed involving palliative care early, and he is on board with this. He had done a masters on advanced directives, and would like to prioritize quality of life at home with minimal abdominal pain over quantity of life.     He also understands that we will consult Medical Oncology, awaiting biopsy. Given the CT findings and Ca 19-9, highly suspicious for infiltrative cholangiocarcinoma.     Finally, we discussed with him that we will transition him to oral anticoagulation after his biopsy. He does not have a PCP.     Medications:    Scheduled Meds:   polyethylene glycol  17 g Oral Daily    pantoprazole  40 mg Intravenous Q24H    docusate sodium  100 mg Oral 2 times per day    mesalamine  4.8 g Oral Daily with breakfast    piperacillin-tazobactam  4.5 g Intravenous Q8H     Continuous Infusions:   sodium chloride      sodium chloride 75 mL/hr (12/25/19 0208)    heparin 1,150 Units/hr (12/24/19 1056)     PRN Meds:.HYDROmorphone hcl, acetaminophen, heparin, sodium chloride, dextrose    Physical examination:      BP 101/59 (BP Location: Right arm)    Pulse 78    Temp 37.1 C (98.8 F) (Oral)    Resp 16    Ht 175.3 cm (5' 9.02")    Wt 53.2 kg (117 lb 3.2 oz)    SpO2 98%    BMI 17.30 kg/m   General appearance: awake and alert, appears comfortable  Cardiovascular: regular rate and rhythm, S1, S2  normal, no murmurs.  Respiratory CTAB. No crackles or wheezes  Gastrointestinal: Normoactive bowel sounds. Abdomen soft, nontender and nondistended  Skin: jaundice with excoriations   Neurological: grossly normal    LAB DATA:         Lab results: 12/24/19  2347 12/24/19  0214 12/23/19  0128 12/22/19  0226 12/21/19  0450   WBC 7.0 8.8 8.3 8.8 7.4   Hemoglobin 10.0* 10.7* 11.1* 12.7* 12.0*   Hematocrit 29* 31* 33* 38* 36*   RBC 3.3* 3.5* 3.7* 4.2* 4.0*   Platelets 337* 367* 389* 440* 438*           Lab results: 12/24/19  2347 12/24/19  0214 12/23/19  0128 12/22/19  0226 12/21/19  0450   Sodium 135 131* 134 136 133   Potassium 3.6 3.6 3.8 3.9 3.8   Chloride 100 96 100 99 97   CO2 24 22 20 22 24    UN 9 8 10 11 9    Creatinine 0.69 0.67 0.74 0.78 0.76   GFR,Caucasian 116 118 113 110 112   GFR,Black 134 136 130 128 129   Glucose 94 110* 101* 123* 97   Calcium 8.9* 9.2 8.8* 9.4 9.3  Total Protein 5.7* 6.0* 6.0* 6.6 6.5   Albumin 3.0* 3.2* 3.0* 3.3* 3.3*   ALT 84* 85* 85* 86* 82*   AST 135* 139* 130* 137* 125*   Alk Phos 610* 635* 623* 681* 682*   Bilirubin,Total 20.9* 21.4* 19.6* 21.1* 21.3*           Lab results: 12/24/19  2347   INR 2.2*     MELD-Na score: 28 at 12/24/2019 11:47 PM  MELD score: 27 at 12/24/2019 11:47 PM  Calculated from:  Serum Creatinine: 0.69 mg/dL (Rounded to 1 mg/dL) at 12/24/2019 11:47 PM  Serum Sodium: 135 mmol/L at 12/24/2019 11:47 PM  Total Bilirubin: 20.9 mg/dL at 12/24/2019 11:47 PM  INR(ratio): 2.2 at 12/24/2019 11:47 PM  Age: 44 years 2 months      IMAGING:   Ct Abdomen And Pelvis With Iv Contrast    Result Date: 12/22/2019  1. Ill-defined, heterogeneous attenuation involving the right hepatic lobe, with focal overlying capsular retraction. This is concerning for possible cholangiocarcinoma, with abscess and metastasis less likely. 2. Sequela of known biliary PSC. Unchanged mixed density lesion in the left hepatic lobe. 2. Soft tissue thickening in the upper retroperitoneum, likely confluent  adenopathy. 3. Splenomegaly of unclear etiology though this may related to developing portal hypertension. 4. Multiple focally dilated small bowel loops, likely representing developing ileus. 5. Distended gallbladder. 6. Sequela of fluid overload, with small right pleural effusion, moderate intra-abdominal ascites, and moderate periportal edema. END OF IMPRESSION I have personally reviewed the images and the Resident's/Fellow's interpretation and agree with or edited the findings. [facility] submits this DICOM format image data and final report to the [facility], an independent secure electronic health information exchange, on a reciprocally searchable basis (with patient authorization) for a minimum of 12 months after exam date.    * Abdomen Standard Ap Single View/kub    Result Date: 12/24/2019  Variably mildly distended segments of bowel in the central abdomen, some are likely small bowel segments and others could be colonic segments. This pattern could be due to an evolving ileus, follow-up radiographs could be performed to further assess as needed. Evidence of ascites again seen. END OF IMPRESSION    Mr Abdomen Without Contrast &  Mrcp With 3d Render    Result Date: 12/22/2019  Suboptimal evaluation due to patient's difficulty with complete the study due to pain. The patient may return for the remaining images when it is clinically appropriate. 1. Distended small bowel loops with small amount of ascites, incompletely evaluated on this exam. This may represent small bowel obstruction or ileus. Recommend further evaluation with CT abdomen and pelvis. 2. Sequela of known PSC, grossly similar to prior exams. 3. Redemonstration of the segment 2 cystic lesion with layering debris, grossly stable since prior exams. Findings discussed with the [facility] by Dr. Kirke Shaggy of radiology as documented in the primordial system. END OF IMPRESSION I have personally reviewed the images and the Resident's/Fellow's  interpretation and agree with or edited the findings. [facility] submits this DICOM format image data and final report to the [facility], an independent secure electronic health information exchange, on a reciprocally searchable basis (with patient authorization) for a minimum of 12 months after exam date.    ASSESSMENT/RECOMMENDATIONS:   44 yo M with PMHx [place] and UC s/p total colectomy w/ J pouch creation in 2010 who was transferred from OSH for further management of obstructive jaundice/worsening liver disease. Presented at OSH with complaints of increasing  abd pain, jaundice, and darkened urine. His OSH course was notable for removal of previously placed CBD stent (12/04/19 as an outpatient) and was c/b new onset DVT/PE and he was started on a heparin drip. Hospital course here notable for ileus and concerning ill-defined heterogenous attenuation of right hepatic lobe on CT scan. We are awaiting biopsy.     [place] now with new ill-defined heterogenous attenuation of right hepatic lobe-   - We will await biopsy of the right hepatic lobe lesion. We have called IR, who defers procedure until INR is less than 1.7. We had ordered FFP and vitamin K, but [facility] Bank would not release products until TEG is completed. Will coordinate with Blood Bank and IR for biopsy to happen today  - Patient was listed for transplant but now is on internal hold given concerns for infiltrative cholangiocarcinoma  - Appreciate Medical Oncology's involvement. Likely with limited chemotherapy options given bilirubin of 20.9  - We discussed with patient, who would appreciate Palliative Care's involvement to discuss end of life goals and planning      Mild Ileus  - Currently NPO for biopsy. After biopsy, will restart clear liquids and advance as tolerated  - Patient is having 6-12 BMs daily (normal for him after total colectomy)  - We will stop senna. Continue Colace and start Miralax    Concern for cholangitis:  - From notes, it seems  that patient was at [facility] for ERCP on January 4th, 2021. He had a stent placed (cannot find procedure notes), and based on telephone notes, then became jaundiced 12/12/2019 at which point he went back to [place]. There, he had biliary stent removed. His most recent MR/MRCP at [facility] on 12/22/2019 shows intrahepatic duct beading, without main duct narrowing. We will get the procedure and cholangiogram report from [facility]  - Patient continues on empiric Zosyn (was started at admission). Blood cultures are NGTD. He has been on Zosyn for the past 5 days.  - Will stop antibiotics and closely monitor    DVT/PE-   - Continue heparin gtt for now pending biopsy. After biopsy, we will need a longer term anticoagulation plan. He has no PCP and if we were to place him on warfarin, it may be more difficult to titrate this medication for him. With apixaban, the patient is childs pugh C, and apixaban is not recommended in use of such patients. Lovenox might be the best option for him.      Abdominal pain:  - Currently on tylenol 655m every 8 hours PRN and has dilaudid 167mIV available every 4 hours PRN (he is only using dilaudid IV twice a day)  - I will add gabapentin 30047mt night (given his symptoms are most pronounced at night)     Malnutrition  - Nutrition consult appreciated    UC s/p total colectomy w/ Jpouch creation 2010  - continue home mesalamine    F: PO after biopsy  E:daily labs, replete PRN  N:CLD and advance, after biopsy    DVT ppxDGU:YQIHKVQt as above, then switch to Lovenox  Bowel regQVZ:DGLOVFd miralax  PUD ppx: IV PPI     Dispo: pending clinical course    JanLaroy AppleD  PGY-6, Gastroenterology and Hepatology Fellow  I saw and evaluated the patient. I agree with the resident's/fellow's findings and plan of care as documented above.    GOPStasia CavalierD

## 2019-12-25 NOTE — Provider Consult (Signed)
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    Palliative care consulted for goals of care discussion.    Kyle Bright did projects in advanced care planning and goals of care and says he knows that palliative care helps with symptom management and end of life care and would like to express his wishes.  He is very clear in his decision making and included his partner of 20 years, [name], on the phone while he expressed himself.  He says that if he is no longer a transplant candidate, and if the chemotherapy is going to have side effects that hurt his quality of life, then he does not wish to pursue those things. He would rather live a shorter amount of time but be able to be at home and eat the food he wants and pet his cat and look outside at nature. These things are very important to him.    [name] supports Kyle Bright's wishes and decisions, and they both understand that we still have more information to obtain from the biopsy and from oncology about treatment options but they want to be prepared in case Kyle Bright's best option that is in alignment with his wishes is comfort care treatment.   [name] says that they may lose their insurance since it was linked with Adarsh's status as a Ship broker and is worried how hospice would be covered then.  Shannan says he is interested in comfort care homes too as he has heard about them before.     [name]'s symptoms include back pain, abdominal pain, constipation and itching. The worst is the abdominal pain  and back pain which seem to be linked with each other. Constipation is also up there for him.  He says his current pain medication helps a bit, and allows him to sleep but he has very fragmented sleep in the hospital due to the nature of receiving medical care.  For this reason too, he would want to be home so that he could get quality sleep. He understands that if his symptoms cannot be managed at home or if there is an issue with delivering medical care at home, he can go to a facility or to the inpatient palliative care unit down the road in that situation.     Says he has been jaundiced since Christmas so it is still relatively new, but his itching has been manageable so far.  He also itches when he is anxious, he says.     Past Medical History:   Diagnosis Date    Anemia     Liver disease     Osteoporosis     Palpitations     Pouchitis     PSC (primary sclerosing cholangitis)     Ulcerative colitis        Past Surgical History:   Procedure Laterality Date    APPENDECTOMY      COLONOSCOPY      TOTAL COLECTOMY  Family Hx:   Family History   Problem Relation Age of Onset    Crohn's disease Brother     Ulcerative colitis Brother     Multiple myeloma Maternal Aunt     Multiple myeloma Maternal Uncle     Colon cancer Neg Hx     Liver Disease Neg Hx        Social Hx: Student    Spiritual Screen:  1. Are there any particular cultural beliefs or practices that are important for [place] to know as we take care of you? no  2. Are there any particular spiritual or religious beliefs or practices that are important for [place] to know as we take care of you? No  3. Would you and your family appreciate a visit from an interfaith chaplain?  They are available 24/7. no     Allergies:   Allergies   Allergen Reactions    Adhesive Tape Rash    Sulfa Antibiotics Rash     Rash on legs        Active Medications:   polyethylene glycol  17 g Oral Daily    gabapentin  300 mg Oral Nightly    pantoprazole  40 mg  Intravenous Q24H    docusate sodium  100 mg Oral 2 times per day    mesalamine  4.8 g Oral Daily with breakfast     Continuous Infusions:   sodium chloride      sodium chloride 75 mL/hr (12/25/19 0208)    heparin 1,150 Units/hr (12/24/19 1056)     PRN Meds:  HYDROmorphone hcl, acetaminophen, heparin, sodium chloride, dextrose    Palliative Care Review of Systems:  ROS unobtainable/patient unresponsive no   Pain Severity: Moderate (if none, erase pain descriptors)    Location abdomen, back   Quality achy  Constant or intermittent both   Aggravating factors laying in bed, movement in certain positions   Alleviating factors pain medicine   Related symptoms  constipation  Shortness of breath None   Anxiety Mild  Depression Unknown  Drowsiness: None  Nausea None,   Loss of appetite Mild  Constipation Moderate last BM was this morning, but still feels constipated  Tiredness Mild  Loss of well-being Moderate  Confusion None  Weakness  unknown  Dysuria  no  Fever  no  Rash yes with excoriations  Visual changes  no  Bleeding  no  Lymphadenopathy  unknown  Others: itching    Palliative Care Performance Status Scale:  Scale = 70  Decreased activity/cares for self/normal intake/alert    Patient Capacity:  Full    Current Therapies:  No ventilator, dialysis, feeding tube, or TPN.   No time limited trial.    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    Pain/dyspnea  Says that his pain bothers him most. Says IV dilaudid does take the edge off of his pain when he gets it.  PO dilaudid would last longer than IV form so would recommend:  - Change Tylenol 649m q8h prn PO to BID  - Start PO Dilaudid 55mPO for moderate pain  - Start PO Dilaudid 74m77mO for severe pain  - Discontinue IV dilaudid      Anxiety/agitation/nausea  Haldol 0.5 mg IV Q3h prn for nausea  Lorazepam 0.5 mg IV Q1h prn for anxiety     Prevention of constipation  - Last stool: 1/25 AM  - continue miralax  - Bisacodyl supp daily prn      GOC/prognosis  FULL  HCP is JesHarriette Oharaignificant other  Estimate prognosis: pending more information  Enrolled w/ [facility] : No    Family Meeting Scheduled: No      Was goals of care or advance care planning discussed? Yes      Goals of care/Advance care planning    Goals: Comfort  Decision-maker(s): patient    Current code status: Full    Is MOLST present? No,  no limitations to care     Living will:  no    HCP:  yes, formal HCP completed  Name of HCP/Surrogate (if known):  See above    Any important changes not captured above:      Total time spent 30 min on GOCDoctor PhillipsD 12/25/2019   Palliative care fellow    Note is not finalized until attested by attending.

## 2019-12-25 NOTE — Provider Consult (Addendum)
[facility]  Oncology Consult Service  Initial Consultation Note    Name: Kyle Bright  MRN: [id number]  PCP: Unknown, Provider, PA  Requesting physician: Dr. Leota Jacobsen  Date: 12/25/19    Reason for Consult: Assistance with evaluation of presumed cholangiocarcinoma     History of Present Illness: [name] is a 44 y.o. male with a PMH of [place] and UC s/p total colectomy who presented to [facility] on 12/20/19 with obstructive jaundice and dark urine.     Kyle Bright states that he has had progressive abdominal pain and most recently worsening jaundice of the past several months.  He had initially lost weight, and it seems to have regained some recently which he thinks could be fluid.  He also notes some itching.  He is primarily frustrated at perceived chances to "catch this earlier."    He initially presented to [facility] with concerns for obstructive jaundice and possible acute cholangitis. He received IV Zosyn and was found to have a PE/non-occulsive DVT. Imaging was notable for an area of hypoenhancement in the R hepatic lobe. He also had a CBD stent removed there, bili continued to rise and was 21 on admission. He was transferred here for HLOC.     He was unable to complete an MR abdomen here due to pain. A CT showed an ill-defined heterogenous attenuation involving the R hepatic lobe with focal capsular retraction; concerning for possible cholangiocarcinoma. Transplant surgery has evaluated him and recommended biopsy of the lesion in the R lobe of the liver. His CA 19-9 was found to be 58,000. He has been on an internal hold for transplant candidacy due to concern for malignancy. Per the Transplant GI team, he is not a surgical candidate even if this was deemed to be resectable due to high MELD.     Past Medical History:  has a past medical history of Anemia, Liver disease, Osteoporosis, Palpitations, Pouchitis, PSC (primary sclerosing cholangitis), and Ulcerative colitis.      Past Surgical  History:  has a past surgical history that includes Colonoscopy; total colectomy; and Appendectomy.     Allergies:   Allergies   Allergen Reactions    Adhesive Tape Rash    Sulfa Antibiotics Rash     Rash on legs         Medications:   No current facility-administered medications on file prior to encounter.      Current Outpatient Medications on File Prior to Encounter   Medication Sig Dispense Refill    mesalamine (LIALDA) 1.2 GM EC tablet Take 4.8 g by mouth daily (with breakfast)       amoxicillin-clavulanate (AUGMENTIN) 500-125 MG per tablet Take 1 tablet by mouth 2 times daily 6 tablet 0    Calcium Carbonate-Simethicone (GAS-X WITH MAALOX EX ST PO) Take by mouth as needed      amoxicillin-clavulanate (AUGMENTIN) 500-125 MG per tablet Take 1 tablet by mouth 2 times daily 10 tablet 0    loperamide (IMODIUM) 2 MG capsule Take 2 mg by mouth 3 times daily as needed       multi-vitamin (TAB-A-VITE/BETA CAROTENE) per tablet Take 1 tablet by mouth every morning       calcium-vitamin D (OSCAL-500) 500-200 MG-UNIT per tablet Take 1 tablet by mouth every morning       cholecalciferol (VITAMIN D) 50 MCG (2000 UT) tablet Take 2,000 Units by mouth every morning       Probiotic Product (VSL#3) CAPS Take 1 capsule by mouth  every morning           Social History:  reports that he has never smoked. He has never used smokeless tobacco. He reports previous alcohol use. He reports previous drug use. No history on file for sexual activity.  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  Lives with a long-term partner, Kyle Bright.    Family History:   Family History   Problem Relation Age of Onset    Crohn's disease Brother     Ulcerative colitis Brother     Multiple myeloma Maternal Aunt     Multiple myeloma Maternal Uncle     Colon cancer Neg Hx     Liver Disease Neg Hx        Review of Systems: Review of Systems: Refer to HPI for pertinent positives and negatives. Remainder of detailed 12 point ROS (constitutional, eyes,  ears/nose/mouth/throat, cardiovascular, respiratory, gastrointestinal, genitourinary, musculoskeletal, integumentary, neurologic, psychiatric, endocrine, hematologic, allergic/immunologic) is negative.     Physical Exam  BP 101/59 (BP Location: Right arm)    Pulse 78    Temp 37.1 C (98.8 F) (Oral)    Resp 16    Ht 175.3 cm (5' 9.02")    Wt 53.2 kg (117 lb 3.2 oz)    SpO2 98%    BMI 17.30 kg/m    General appearance: lying in bed in NAD, appears stated age.   HENT: Wearing a mask   Eyes: Prominent scleral icterus EOMI, PERRL.    Abdominal: Scaphoid abdomen   Skin: Diffuse jaundice   Extremities: Warm to touch, palpable pulses, no lower extremity edema.   Neuro: Alert and oriented to person, place and time; normal comprehension and speech. grossly normal cranial nerves, strength and sensation; moves all four extremities spontaneously.   Psych: Appropriately somber affect.  Expresses some frustration.    Laboratory Results: Personally reviewed and notable for bili 20.9, AST 135, ALT 84,wbc 7.0, Hgb 10.0, platelets 337    Radiology: Personally reviewed and notable for   1. Ill-defined, heterogeneous attenuation involving the right hepatic lobe, with focal overlying capsular retraction. This is concerning for possible cholangiocarcinoma, with abscess and metastasis less likely.      2. Sequela of known biliary PSC. Unchanged mixed density lesion in the left hepatic lobe.      2. Soft tissue thickening in the upper retroperitoneum, likely confluent adenopathy.      3. Splenomegaly of unclear etiology though this may related to developing portal hypertension.      4. Multiple focally dilated small bowel loops, likely representing developing ileus.      5. Distended gallbladder.      6. Sequela of fluid overload, with small right pleural effusion, moderate intra-abdominal ascites, and moderate periportal edema.       Pathology: 10/23: Common bile duct, brushing:   Atypical biliary cells, favor reactive.      Impression:   [name] is a 44 y.o. male with PMH of [place] and UC s/p total colectomy who presented to [facility] on 12/20/19 with obstructive jaundice; found to have an unresectable liver mass and markedly elevated CA 19-9, concerning for cholangiocarcinoma of which a biopsy is pending.     Darril is in a challenging position. We are essentially unable to offer any chemotherapy with his bilirubin of 20.9. We also feel as though RT would be dangerous with a bilirubin that high.  I discussed with Yashas and Kyle Bright that his bilirubin would have to be closer to around 6  or 8 before chemotherapy would be safe. I informed them that transplant hepatology team was considering any possible interventions that would help lower the bilirubin (assessing most recent ERCP, considering PTC) but is my understanding that at their first pass these seem unlikely to be possible. There is great concern that his bilirubin is this high solely because of the mass.     I expressed my sorrow that he was in this situation. Together we planned to go forward with the biopsy, determine if there are any interventions possible to attempt to lower the bilirubin, and regroup following results.  I do not think staging CT chest is necessary at this time given the hurdles that would have to be overcome in order for him to receive chemotherapy.  He has already met with palliative care regarding advanced care planning given the likelihood of having to focus on comfort as opposed to treatment.    Recommend:    - IR guided biopsy of liver mass when possible, will follow for results   - Transplant Hep to review prior ERCP to assure there is no mechanical mechanism to his direct hyperbilirubinemia that could be addressed with either repeat ERCP or PTC   - Plan to see [place] again following biopsy and above determination from Transplant Hep    Thank you for this consult.    Recommendations discussed with primary inpatient team.    We will continue to  follow.  The patient was discussed with attending oncologist Dr. Morton Peters who will see the patient tomorrow 1/26.   _______________________________________________  Elouise Munroe, MD   Hematology/Oncology Fellow  [place]   12/25/19 at 10:43 AM

## 2019-12-26 ENCOUNTER — Encounter: Payer: Self-pay | Admitting: Gastroenterology

## 2019-12-26 ENCOUNTER — Inpatient Hospital Stay: Payer: PRIVATE HEALTH INSURANCE

## 2019-12-26 LAB — BLOOD CULTURE
Bacterial Blood Culture: 0
Bacterial Blood Culture: 0

## 2019-12-26 LAB — COMPREHENSIVE METABOLIC PANEL
ALT: 96 U/L — ABNORMAL HIGH (ref 0–50)
AST: 151 U/L — ABNORMAL HIGH (ref 0–50)
Albumin: 3 g/dL — ABNORMAL LOW (ref 3.5–5.2)
Alk Phos: 650 U/L — ABNORMAL HIGH (ref 40–130)
Anion Gap: 12 (ref 7–16)
Bilirubin,Total: 25.4 mg/dL — ABNORMAL HIGH (ref 0.0–1.2)
CO2: 24 mmol/L (ref 20–28)
Calcium: 9.2 mg/dL (ref 9.0–10.3)
Chloride: 101 mmol/L (ref 96–108)
Creatinine: 0.69 mg/dL (ref 0.67–1.17)
GFR,Black: 134 *
GFR,Caucasian: 116 *
Glucose: 118 mg/dL — ABNORMAL HIGH (ref 60–99)
Lab: 12 mg/dL (ref 6–20)
Potassium: 3.8 mmol/L (ref 3.3–5.1)
Sodium: 137 mmol/L (ref 133–145)
Total Protein: 5.6 g/dL — ABNORMAL LOW (ref 6.3–7.7)

## 2019-12-26 LAB — CBC
Hematocrit: 26 % — ABNORMAL LOW (ref 40–51)
Hematocrit: 29 % — ABNORMAL LOW (ref 40–51)
Hemoglobin: 8.5 g/dL — ABNORMAL LOW (ref 13.7–17.5)
Hemoglobin: 9.4 g/dL — ABNORMAL LOW (ref 13.7–17.5)
MCH: 30 pg/cell (ref 26–32)
MCH: 30 pg/cell (ref 26–32)
MCHC: 33 g/dL (ref 32–37)
MCHC: 33 g/dL (ref 32–37)
MCV: 92 fL (ref 79–92)
MCV: 92 fL (ref 79–92)
Platelets: 340 10*3/uL — ABNORMAL HIGH (ref 150–330)
Platelets: 365 10*3/uL — ABNORMAL HIGH (ref 150–330)
RBC: 2.8 MIL/uL — ABNORMAL LOW (ref 4.6–6.1)
RBC: 3.1 MIL/uL — ABNORMAL LOW (ref 4.6–6.1)
RDW: 18 % — ABNORMAL HIGH (ref 11.6–14.4)
RDW: 17.7 % — ABNORMAL HIGH (ref 11.6–14.4)
WBC: 7.2 10*3/uL (ref 4.2–9.1)
WBC: 7.1 10*3/uL (ref 4.2–9.1)

## 2019-12-26 LAB — CBC AND DIFFERENTIAL
Baso # K/uL: 0 10*3/uL (ref 0.0–0.1)
Basophil %: 0.2 %
Eos # K/uL: 0.2 10*3/uL (ref 0.0–0.5)
Eosinophil %: 2.2 %
Hematocrit: 32 % — ABNORMAL LOW (ref 40–51)
Hemoglobin: 10.2 g/dL — ABNORMAL LOW (ref 13.7–17.5)
IMM Granulocytes #: 0 10*3/uL (ref 0.0–0.0)
IMM Granulocytes: 0.3 %
Lymph # K/uL: 0.8 10*3/uL — ABNORMAL LOW (ref 1.3–3.6)
Lymphocyte %: 9.3 %
MCH: 30 pg/cell (ref 26–32)
MCHC: 32 g/dL (ref 32–37)
MCV: 92 fL (ref 79–92)
Mono # K/uL: 1.1 10*3/uL — ABNORMAL HIGH (ref 0.3–0.8)
Monocyte %: 12.7 %
Neut # K/uL: 6.6 10*3/uL — ABNORMAL HIGH (ref 1.8–5.4)
Nucl RBC # K/uL: 0 10*3/uL (ref 0.0–0.0)
Nucl RBC %: 0 /100 WBC (ref 0.0–0.2)
Platelets: 434 10*3/uL — ABNORMAL HIGH (ref 150–330)
RBC: 3.5 MIL/uL — ABNORMAL LOW (ref 4.6–6.1)
RDW: 18.4 % — ABNORMAL HIGH (ref 11.6–14.4)
Seg Neut %: 75.3 %
WBC: 8.7 10*3/uL (ref 4.2–9.1)

## 2019-12-26 LAB — PROTIME-INR
INR: 1.1 (ref 0.9–1.1)
Protime: 12 s (ref 10.0–12.9)

## 2019-12-26 LAB — PHOSPHORUS: Phosphorus: 2.7 mg/dL (ref 2.7–4.5)

## 2019-12-26 LAB — MAGNESIUM: Magnesium: 2.4 mg/dL (ref 1.6–2.5)

## 2019-12-26 LAB — FECAL PANCREATIC ELASTASE: Pancreatic Elastase-1: 599 ug/g (ref >=100)

## 2019-12-26 MED ORDER — HYDROMORPHONE HCL 2 MG PO TABS *I*
2.0000 mg | ORAL_TABLET | ORAL | Status: DC | PRN
Start: 2019-12-26 — End: 2019-12-26

## 2019-12-26 MED ORDER — FENTANYL CITRATE 50 MCG/ML IJ SOLN *WRAPPED*
INTRAMUSCULAR | Status: AC
Start: 2019-12-26 — End: 2019-12-26
  Filled 2019-12-26: qty 2

## 2019-12-26 MED ORDER — MIDAZOLAM HCL 1 MG/ML IJ SOLN *I* WRAPPED
INTRAMUSCULAR | Status: AC
Start: 2019-12-26 — End: 2019-12-26
  Filled 2019-12-26: qty 2

## 2019-12-26 MED ORDER — SODIUM CHLORIDE 0.9 % IV SOLN WRAPPED *I*
75.0000 mL/h | Status: DC
Start: 2019-12-27 — End: 2019-12-27
  Administered 2019-12-27: 75 mL/h via INTRAVENOUS

## 2019-12-26 MED ORDER — PANTOPRAZOLE SODIUM 40 MG IV SOLR *I*
40.0000 mg | Freq: Two times a day (BID) | INTRAVENOUS | Status: DC
Start: 2019-12-26 — End: 2020-01-02
  Administered 2019-12-26 – 2020-01-02 (×14): 40 mg via INTRAVENOUS
  Filled 2019-12-26 (×14): qty 10

## 2019-12-26 MED ORDER — LIDOCAINE HCL 1 % IJ SOLN *I*
INTRAMUSCULAR | Status: AC
Start: 2019-12-26 — End: 2019-12-26
  Filled 2019-12-26: qty 20

## 2019-12-26 MED ORDER — HYDROMORPHONE HCL 2 MG PO TABS *I*
2.0000 mg | ORAL_TABLET | ORAL | Status: DC | PRN
Start: 2019-12-26 — End: 2019-12-30
  Administered 2019-12-28 – 2019-12-30 (×5): 2 mg via ORAL
  Filled 2019-12-26 (×5): qty 1

## 2019-12-26 MED ORDER — HYDROMORPHONE HCL 4 MG PO TABS *I*
4.0000 mg | ORAL_TABLET | ORAL | Status: DC | PRN
Start: 2019-12-26 — End: 2019-12-30
  Administered 2019-12-26 – 2019-12-30 (×6): 4 mg via ORAL
  Filled 2019-12-26 (×6): qty 1

## 2019-12-26 MED ORDER — SODIUM CHLORIDE 0.9 % IV SOLN WRAPPED *I*
3.0000 mL/h | Status: DC
Start: 2019-12-26 — End: 2020-01-01

## 2019-12-26 MED ORDER — HALOPERIDOL LACTATE 5 MG/ML IJ SOLN *I*
0.5000 mg | INTRAMUSCULAR | Status: DC | PRN
Start: 2019-12-26 — End: 2020-01-05

## 2019-12-26 MED ORDER — HYDROMORPHONE HCL 2 MG PO TABS *I*
1.0000 mg | ORAL_TABLET | Freq: Once | ORAL | Status: AC
Start: 2019-12-26 — End: 2019-12-26
  Administered 2019-12-26: 1 mg via ORAL
  Filled 2019-12-26: qty 1

## 2019-12-26 MED ORDER — LORAZEPAM 0.5 MG PO TABS *I*
1.0000 mg | ORAL_TABLET | ORAL | Status: DC | PRN
Start: 2019-12-26 — End: 2020-01-01
  Administered 2019-12-27: 1 mg via ORAL
  Filled 2019-12-26: qty 2

## 2019-12-26 MED ORDER — HYDROMORPHONE HCL 4 MG PO TABS *I*
4.0000 mg | ORAL_TABLET | ORAL | Status: DC | PRN
Start: 2019-12-26 — End: 2019-12-26

## 2019-12-26 MED ORDER — CEFTRIAXONE SODIUM 1 G IN STERILE WATER 10ML SYRINGE *I*
1000.0000 mg | INTRAVENOUS | Status: DC
Start: 2019-12-26 — End: 2019-12-27
  Administered 2019-12-26: 1000 mg via INTRAVENOUS
  Filled 2019-12-26: qty 1000
  Filled 2019-12-26: qty 10

## 2019-12-26 NOTE — Progress Notes (Signed)
[employer] [employer] Nursing Procedure Note    Kyle Bright  [id number]    Procedure: Liver  Bx        Status: Aborted, reason: see provider notes    Report given to:Unit Nurse. Unit 734 Name Ubaldo Glassing RN      Last Filed Vitals    12/26/19 1030   BP: 99/67   Pulse: 79   Resp: 17   Temp:    SpO2: 100%

## 2019-12-26 NOTE — Comprehensive Assessment (Signed)
Writer's contact with patient was via phone.      Adult Social Work Initial Assessment    SW spoke with patient to introduce self and discuss discharge planning. Patient lives with [name], his significant other, of 20 yrs in an apartment with elevator access. Patient is fully independent at baseline. 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 Patient shares that he is processing potentially terminal diagnosis and trying to figure out best plan for after discharge (stay locally or stay out of state with family). Patient shares that both his family and Jessica's family live out of state. Patient's one brother lives in [place] and would be able to take some time off. Patient's mother and other brother reside in [place]- patient notes that this brother is unemployed and he is confident both brothers and mother would be able to come to see/assist patient. Jessica's parents lives in [place].     Patient shares that his insurance is currently through school and he is unsure if they will allow him to remain on the insurance plan if he has to withdraw from school. SW placed [facility] referral to see if financial counselor could contact patient to discuss potential insurance options. SW also offered chaplain services and advised patient could ask nursing to page chaplain if he was interested. Patient appreciative.     Demographics:  Religious Beliefs: None  [place] of Residence: Other (Comment)([place])  Marital status: Significant other(of 20 years)  Ethnicity/Race: Caucasian  Primary Language: English  Primary Care Taker of?: No one    Risk Factors:  Risk Factors: Adjustment to Dx/Injury/Illness    Advance Directive:  *Has patient (or family) completed any of the following? (select all that apply): Health Care Proxy (HCP)(& detailed 5 wishes document)  HCP Name: [name]  HCP Phone Number: [phone]  HCP available for  inclusion in the chart?: Yes  HCP in chart: Yes    Personal Contacts/Support System:  More[place] Hummingbird (S/O): [id number]    Living Situation:  Lives With: Significant other  Primary Care Taker of?: No one    Home Geography:  Type of Home: Apartment  # Of Steps In Home: 0(elevator)  # Steps to Enter Home: 0  Utilitites Working: Yes    Psychosocial:  Person assessed: Patient  Coping Status: With some difficulty  Current Goal of Care: (possibly end of life, hospice)    Alcohol Assessment:  > 0 ETOH drinks in past month: No    Substance Abuse (Not including alcohol):  Other Substance Abuse: No    Baseline ADL functioning:  Transfers: Independent  Ambulation: Independent  Assistive Device: none  Bathing/Grooming: Independent  Meal Prep: Independent  Able to feed self?: Yes  Household maintenance/chores: Independent  Able to drive?: Yes    Income Information:  Vocational: Part time employment(& working on PhD)  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 : No  Is the patient presumed eligible for OPWDD services?: No    Home Care Services:  Do you currently have home care services?: No     Home Oxygen:  Do you have home oxygen?: No     Keyshun Elpers Cinquino, LMSW   12:00 PM on 12/26/2019

## 2019-12-26 NOTE — Invasive Procedure Plan of Care (Signed)
[facility] [facility]  OR SURGICAL PROCEDURE                            Patient Name: Kyle Bright  [facility] [id number] MR                                                              DOB: [birth year]         Please read this form or have someone read it to you.   It's important to understand all parts of this form. If something isn't clear, ask [place] to explain.   When you sign it, that means you understand the form and give [place] permission to do this surgery or procedure.     I agree for Earmon Phoenix, PA along with any assistants* they may choose, to treat the following condition(s): Abnormal tissue needing sampling   By doing this surgery or procedure on me: Introduction of a needle under image guidance in to abnormal liver tissue and obtaining tissue samples    For the extent of the COVID-19 pandemic, [facility] is avoiding physical signatures on consent forms in an effort to reduce transmission of COVID-19. I have reviewed the procedure, risks, and alternatives documented above with the patient. All questions have been answered. The patient expresses understanding and has verbally consented to the procedure.       This is also known as: Imaging guided biopsy of liver lesion   Laterality: Right     *if you'd like a list of the assistants, please ask. We can give that to you.    1. The care provider has explained my condition to me. They have told me how the procedure can help me. They have told me about other ways of treating my condition. I understand the care provider cannot guarantee the result of the procedure. If I don't have this procedure, my other choices are: Not to perform the procedure    2. The care provider has told me the risks (problems that can happen) of the procedure. I understand there may be unwanted results. The risks that are related to this procedure include: Bleeding, infection, damage to surrounding organs and structures, allergic  reaction to contrast agent, collapse of lung, and in very rare circumstances, death.    3. I understand that during the procedure, my care provider may find a condition that we didn't know about before the treatment started. Therefore, I agree that my care provider can perform any other treatment which they think is necessary and available.    4. I understand the care provider may remove tissue, body parts, or materials during this procedure. These materials may be used to help with my diagnosis and treatment. They might also be used for teaching purposes or for research studies that I have separately agreed to participate in. Otherwise they will be disposed of as required by law.    5. My care provider might want a representative from a [place] to be there during my procedure. I understand that person works for:          The ways they might help my care provider during my procedure include:  6. Here are my decisions about receiving blood, blood products, or tissues. I understand my decisions cover the time before, during and after my procedure, my treatment, and my time in the hospital. After my procedure, if my condition changes a lot, my care provider will talk with me again about receiving blood or blood products. At that time, my care provider might need me to review and sign another consent form, about getting or refusing blood.    I understand that the blood is from the community blood supply. Volunteers donated the blood, the volunteers were screened for health problems. The blood was examined with very sensitive and accurate tests to look for hepatitis, HIV/AIDS, and other diseases. Before I receive blood, it is tested again to make sure it is the correct type.    My chances of getting a sickness from blood products are small. But no transfusion is 100% safe. I understand that my care provider feels the good I will receive from the blood is greater than the chances of something  going wrong. My care provider has answered my questions about blood products.      My decision  about blood or  blood products           My decision   about tissue  Implants              I understand this  form.    My care provider  or his/her  assistants have  explained:   What I am having done and why I need it.  What other choices I can make instead of having this done.  The benefits and possible risks (problems) to me of having this done.  The benefits and possible risks (problems) to me of receiving transplants, blood, or blood products.  There is no guarantee of the results.  The care provider may not stay with me the entire time that I am in the operating or procedure room.  My provider has explained how this may affect my procedure. My provider has answered my  questions about this.         I give my  permission for  this surgery or  procedure.            _______________________________________________                                     My signature  (or parent or other person authorized to sign for you, if you are unable to sign for  yourself or if you are under 80 years old)        ______           Date        _____        Time   Electronic Signatures will display at the bottom of the consent form.    Care provider's statement: I have discussed the planned procedure, including the possibility for transfusion of blood  products or receipt of tissue as necessary; expected benefits; the possible complications and risks; and possible alternatives  and their benefits and risks with the patients or the patient's surrogate. In my opinion, the patient or the patient's surrogate  understands the proposed procedure, its risks, benefits and alternatives.              Electronically signed by: Earmon Phoenix, PA  12/26/2019         Date        10:04 AM        Time

## 2019-12-26 NOTE — Preop H&P (Signed)
UPDATES TO PATIENT'S CONDITION on the DAY OF SURGERY/PROCEDURE    I. Updates to Patient's Condition (to be completed by a provider privileged to complete a H&P, following reassessment of the patient by the provider):    Day of Surgery/Procedure Update:  History  (Inpatients only): I confirm that progress notes within the past 24 hours document updates to the patient's condition.    Physical  (Inpatients only): I confirm that progress notes within the past 24 hours document updates to the patient's condition.    Pre-Procedure Assessment  Airway Visibility: Posterior pharynx  Loose/Broken Teeth, Oral Piercings: Not Applicable  Lungs: Clear  Heart sounds: Rate: 85 bpm  Heart sounds rhythm: Regular Rate Rhythm  ASA Physical status rating: Class III: Severe systemic disease       II. Procedure Readiness   I have reviewed the patient's H&P and updated condition. By completing and signing this form, I attest that this patient is ready for surgery/procedure.    III. Attestation   I have reviewed the updated information regarding the patient's condition and it is appropriate to proceed with the planned surgery/procedure.    I have reminded Kyle Bright that COVID-19 is still present in our community. He was advised that [facility] and its affiliates have made deliberate and widespread changes to policies and procedures, consistent with applicable directives, in order to reduce the risk of exposure in our facilities.     I further explained and Kyle Bright understands that given the communicability of the SARS CoV2 coronavirus, there remains a small but real risk of contracting the disease while receiving perioperative care - even with stringent preventive measures in place.   Kyle Bright understands the potential consequences of COVID-19 disease as it relates to their planned procedure and anticipated postoperative course.      The patient and I have considered and discussed the relative risks and benefits of proceeding with  his/her surgery - both in terms of the procedure itself, and also in the context of the ongoing pandemic.  Kyle Bright wishes to proceed with the procedure.     Kyle Bright, Kyle Bright as of 10:10 AM 12/26/2019

## 2019-12-26 NOTE — Progress Notes (Signed)
Interventional Radiology Pre-Procedure Handoff/Checklist    NPO:  Yes  Tube feed Stopped: N/A  Anticoagulants:heparin drip off @ 0730  Telemetry: No  Transport mode: Technical brewer of Transporters needed: 1  Hover Mat:  N/A  Is patient changed into a hospital gown? Yes  Reminded floor RN to have patient remove jewelry and leave valuables on the unit: Yes  IV access:   Peripheral IV Left Forearm (distal third) (Active)   Phlebitis Scale Grade 0 12/25/19 2045   Infiltration Scale Grade 0 12/25/19 2045   Line Status Flushed;No blood return;Saline locked;Capped 12/25/19 2045   Dressing Type Transparent 12/25/19 2045   Dressing Status Clean, dry and intact 12/25/19 2045       Peripheral IV  Left;Anterior Forearm (middle third) (Active)   Phlebitis Scale Grade 0 12/25/19 2045   Infiltration Scale Grade 0 12/25/19 2045   Line Status Infusing 12/25/19 2045   Dressing Type Transparent 12/25/19 2045   Dressing Status Clean, dry and intact 12/25/19 2045     Respiratory: Room Air  Consentable:yes  Alert and Oriented to person, place and time?: yes  Code Status: Full Code  Does patient wear an insulin pump? N/A  Precautions:none  Allergies:   Allergies   Allergen Reactions    Adhesive Tape Rash    Sulfa Antibiotics Rash     Rash on legs        Beryle Quant, RN Received Handoff Report from [place], [place] for IR procedure 7:21 AM

## 2019-12-26 NOTE — Progress Notes (Signed)
Blood Transfusion Consent Form    Full disclosure of indications, risks and alternatives were reviewed with patient as detailed on the blood transfusion consent form, Verbal consent was obtained in light of current hospital policy regarding [id number] pandemic.    Welford Roche, NP

## 2019-12-26 NOTE — Progress Notes (Addendum)
Transplant Hepatology Follow up    Admit Date:  12/20/2019    Interval Events:   Patient had multiple blood BMs overnight  INR now of 1.1  Heparin held starting at 7:30am    Subjective:     Bloody BMs overnight and continued into this morning. Still with significant abdominal pain, and could not sleep between 3-5am. Had productive discussion with palliative care and medical oncology. Would like to try oral longer acting narcotics. Added gabapentin last night.     We discussed risks of biopsy, including bleed. Discussed also that it would give definitive answer. Patient would like to proceed with the biopsy.     We discussed holding heparin in the setting of his ongoing GI bleed. We discussed that we may plan for scope tomorrow, if within goals.       Medications:    Scheduled Meds:   HYDROmorphone  1 mg Oral Once    polyethylene glycol  17 g Oral Daily    gabapentin  300 mg Oral Nightly    acetaminophen  650 mg Oral BID    pantoprazole  40 mg Intravenous Q24H    docusate sodium  100 mg Oral 2 times per day    mesalamine  4.8 g Oral Daily with breakfast     Continuous Infusions:   sodium chloride      sodium chloride 75 mL/hr (12/26/19 0104)    heparin Stopped (12/26/19 0723)     PRN Meds:.HYDROmorphone **OR** HYDROmorphone, hydrOXYzine HCl, heparin, sodium chloride, dextrose    Physical examination:      BP 113/65 (BP Location: Right arm)    Pulse 86    Temp 37.7 C (99.9 F) (Oral)    Resp 20    Ht 175.3 cm (5' 9.02")    Wt 53.2 kg (117 lb 3.2 oz)    SpO2 99%    BMI 17.30 kg/m   General appearance: awake and alert, jaundiced, thin  Cardiovascular: regular rate and rhythm, S1, S2 normal, no murmurs.  Respiratory CTAB. No crackles or wheezes  Gastrointestinal: Normoactive bowel sounds. Abdomen soft, tender and nondistended  Skin: jaundice with excoriations   Neurological: grossly normal    LAB DATA:         Lab results: 12/26/19  0102 12/25/19  2200 12/24/19  2347 12/24/19  0214 12/23/19  0128  12/22/19  0226   WBC 8.7  --  7.0 8.8 8.3 8.8   Hemoglobin 10.2* 9.7* 10.0* 10.7* 11.1* 12.7*   Hematocrit 32* 29* 29* 31* 33* 38*   RBC 3.5*  --  3.3* 3.5* 3.7* 4.2*   Platelets 434*  --  337* 367* 389* 440*           Lab results: 12/26/19  0102 12/24/19  2347 12/24/19  0214 12/23/19  0128 12/22/19  0226   Sodium 137 135 131* 134 136   Potassium 3.8 3.6 3.6 3.8 3.9   Chloride 101 100 96 100 99   CO2 24 24 22 20 22    UN 12 9 8 10 11    Creatinine 0.69 0.69 0.67 0.74 0.78   GFR,Caucasian 116 116 118 113 110   GFR,Black 134 134 136 130 128   Glucose 118* 94 110* 101* 123*   Calcium 9.2 8.9* 9.2 8.8* 9.4   Total Protein 5.6* 5.7* 6.0* 6.0* 6.6   Albumin 3.0* 3.0* 3.2* 3.0* 3.3*   ALT 96* 84* 85* 85* 86*   AST 151* 135* 139* 130* 137*   Alk  Phos 650* 610* 635* 623* 681*   Bilirubin,Total 25.4* 20.9* 21.4* 19.6* 21.1*           Lab results: 12/26/19  0102   INR 1.1     MELD-Na score: 20 at 12/26/2019  1:02 AM  MELD score: 20 at 12/26/2019  1:02 AM  Calculated from:  Serum Creatinine: 0.69 mg/dL (Rounded to 1 mg/dL) at 12/26/2019  1:02 AM  Serum Sodium: 137 mmol/L at 12/26/2019  1:02 AM  Total Bilirubin: 25.4 mg/dL at 12/26/2019  1:02 AM  INR(ratio): 1.1 at 12/26/2019  1:02 AM  Age: 44 years 2 months      IMAGING:   * Abdomen Standard Ap Single View/kub    Result Date: 12/24/2019  Variably mildly distended segments of bowel in the central abdomen, some are likely small bowel segments and others could be colonic segments. This pattern could be due to an evolving ileus, follow-up radiographs could be performed to further assess as needed. Evidence of ascites again seen. END OF IMPRESSION    ASSESSMENT/RECOMMENDATIONS:   44 yo M with PMHx [place] and UC s/p total colectomy w/ J pouch creation in 2010 who was transferred from OSH for further management of obstructive jaundice/worsening liver disease. Presented at OSH with complaints of increasing abd pain, jaundice, and darkened urine. His OSH course was notable for removal of  previously placed CBD stent (12/04/19 as an outpatient) and was c/b new onset DVT/PE and he was started on a heparin drip. Hospital course here notable for ileus and concerning ill-defined heterogenous attenuation of right hepatic lobe on CT scan. We are awaiting biopsy.     Weber now with new ill-defined heterogenous attenuation of right hepatic lobe-   - We discussed biopsy with risks. Will proceed with biopsy today after discussion  - Patient was listed for transplant but now is on internal hold given concerns for infiltrative cholangiocarcinoma  - Appreciate Medical Oncology's involvement. Likely with limited chemotherapy options given bilirubin of 20.9. Discussed imaging with advanced endoscopist, and limited utility in ERCP for stenting  - Appreciate Palliative Care involvement and patient is comfort-oriented. Will also implement palliative care recommendations regarding abdominal pain control     GI bleed:  - Patient with multiple bright red bloody BMs overnight  - Holding Heparin and will recheck CBC after biopsy.   - Transfuse for hemoglobin < 7 or hematocrit < 21  - Increase PPI to IV BID  - Pending discussion with patient, if still with bloody BMs today, will plan for scope tomorrow both EGD and pouchoscopy. COVID tested within 7 days    Mild Ileus: resolved  - Currently NPO for biopsy. After biopsy, will restart clear liquids and advance as tolerated   - We will stop senna and Miralax. Continue Colace only    Concern for cholangitis:  - From notes, patient was at [facility] for ERCP on January 4th, 2021. He had a stent placed, and based on telephone notes, then became jaundiced 12/12/2019 at which point he went back to [place]. There, he had biliary stent removed. His most recent MR/MRCP at [facility] on 12/22/2019 shows intrahepatic duct beading, without main duct narrowing. Reviewed the imaging with advanced endoscopist, and the utility of stent in addressing hyperbilirubinemia is low. Will not plan for repeat ERCP  here  - Patient continues on empiric Zosyn (was started at admission). Blood cultures are NGTD. He has been on Zosyn for the past 5 days.  - Will stop antibiotics and closely monitor  DVT/PE-   - Patient had bloody BMs overnight, and heparin drip held this morning  - After biopsy, we will need a longer term anticoagulation plan after addressing his GI bleed. Depending on our discussions with the patient, he may lean towards comfort oriented approach and no anticoagulation. Otherwise, as noted yesterday, he has no PCP and if we were to place him on warfarin, it may be more difficult to titrate this medication for him. With apixaban, the patient is childs pugh C, and apixaban is not recommended in use of such patients. Lovenox might be the best option for him.      Abdominal pain:  - Currently on tylenol 632m every 8 hours PRN and has dilaudid 187mIV available every 4 hours PRN (he is only using dilaudid IV twice a day)  - Continue gabapentin 30064mt night (given his symptoms are most pronounced at night)   - Will add Palliative Care recommendations     Malnutrition  - Nutrition consult appreciated    UC s/p total colectomy w/ Jpouch creation 2010  - continue home mesalamine    F: PO after biopsy  E:daily labs, replete PRN  N:CLD and advance, after biopsy    DVT ppxMPN:TIRWERXt held as above  Bowel regVQM:GQQPYPd miralax  PUD ppx: IV PPI BID    Dispo: pending clinical course    JanLaroy AppleD  PGY-6, Gastroenterology and Hepatology Fellow  I saw and evaluated the patient. I agree with the resident's/fellow's findings and plan of care as documented above.    GOPStasia CavalierD

## 2019-12-26 NOTE — Progress Notes (Signed)
Patient's chart reviewed for discharge planning needs. [facility] will continue to be available to coordinate discharge.    Marston Mccadden, RN-BC  Acute Care Coordinator [id number]  Phone # [phone]  Pager # [phone]  Weekend Pager # [phone]

## 2019-12-26 NOTE — Progress Notes (Signed)
Interventional Radiology Midlevel Progress Note    Pt arrived for liver biopsy. BP in the 90s and as low as 71 systolic. Pt noted ongoing GIB overnight. Hgb 8.5 this am from 10.2 eight hours earlier. There is also some ascites surrounding the liver, which may increase risk of bleeding post biopsy.   -Give that liver biopsy has a high risk of bleeding and pt has ongoing bleeding from another source with soft BP, we will defer biopsy today.   -Will reevaluate tomorrow, looking for stable H+H and VS.   -Will need INR in the am and possible reversal with vit K again if procedure is to go forward tomorrow.     Earmon Phoenix, PA

## 2019-12-26 NOTE — Plan of Care (Signed)
Brief GI Fellow Note:    Spoke with patient regarding biopsy and EGD/pouchoscopy.     His biopsy was postponed due to hypotension with systolics in the [id number]. We discussed that biopsy would provide tissue for diagnosis of cholangiocarcinoma, but with his Ca 19-9 and imaging findings, as well as clinical symptoms, the highest suspicious is cholangiocarcinoma. The patient, after weighing the risks and benefits to biopsy, has decided ultimately not pursue biopsy and accept the likely possibility of cholangiocarcinoma. We will cancel biopsy with IR.     In regards to his GI bleed, he continues with bright red blood per rectum. We are giving him 1 unit pRBC, and we discussed that an EGD and pouchoscopy could help with identifying the source of this acute bleed as well as offer an avenue for intervention if the source is found. Discussed risks, but as this is an acute bleed, discussed benefits of the procedures in that setting. Patient agrees.    For the extent of the COVID-19 pandemic, [facility] is avoiding physical signatures on consent forms in an effort to reduce transmission of COVID-19. I have reviewed the procedure, risks, and alternatives documented above with the patient. All questions have been answered. The patient expresses understanding and has verbally consented to the procedure and for blood transfusions in the event of an emergency during the procedure. We will plan for these procedures tomorrow morning with Dr. Kalman Drape. Okay to restart clear liquids now, NPO at midnight, and give patient one tap water enema tonight and another tap water enema tomorrow morning. Continue to hold heparin.     We briefly touched on the topic of long term anticoagulation, but will address this again after the scopes tomorrow. Continue PPI IV BID. Continue empiric antibiotics for SBP prophylaxis in setting of bleed. Will recheck labs later today and transfuse as needed.    Kyle Apple, MD 12/26/2019 4:10 PM

## 2019-12-26 NOTE — Progress Notes (Signed)
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 Palliative care consulted for symptom management and goals of care.    Subjective:  - Had a rough night, could not sleep, had pain  - Pain medication: 1mg  did nothing for him, 2mg  helped this morning  - Had GI bleeding now, and is very worried and disturbed by it. Hopes he can get his colonoscopy soon in the coming days  - Very worried about going home and not being stuck in the hospital    Palliative Care ROS:  ROS negative except as noted above.      Pain Medications in past 24 hours:  1mg  oral dilaudid x 2  2mg  oral dilaudid x 1    Physical Examination:   BP: (93-113)/(49-73)   Temp:  [37 C (98.6 F)-37.7 C (99.9 F)]   Temp src: Oral (01/26 1249)  Heart Rate:  [74-94]   Resp:  [12-20]   SpO2:  [97 %-100 %]   Weight:  [52.5 kg (115 lb 12.8 oz)]     Gen: No acute distress, appears comfortable  Eyes: EOMI  Head: atraumatic  Lungs: CTAB, non labored breathing  CV: RRR, no murmurs  Abd: soft  Ext: warm, well perfused, pulses in tact  Neuro: deferrred      Assessment/Plan:    Kyle Bright is a 44 year old man with history of ulcerative colitis and 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 Palliative care consulted for symptom management and goals of care.    Pain/dyspnea  Had good effect with 2mg  this morning.  So will continue with 2mg  for moderate pain.   - Change Tylenol 650mg  q8h prn PO to BID  Continue:  - PO Dilaudid 4mg  PO for  moderate pain  - PO Dilaudid 2mg  PO for severe pain    Anxiety/agitation/nausea  Haldol 0.5 mg IV Q3h prn for nausea  Lorazepam 0.5 mg IV Q1h prn for anxiety    Prevention of constipation  - Last stool: 1/25 AM  - continue miralax  - Bisacodyl supp daily prn     GOC/prognosis  FULL  HCP is [name], significant other  Estimate prognosis: pending more information  Enrolled w/ [facility] : No    Nancy Nordmann, MD 12/26/2019

## 2019-12-27 ENCOUNTER — Inpatient Hospital Stay: Payer: PRIVATE HEALTH INSURANCE | Admitting: Gastroenterology

## 2019-12-27 ENCOUNTER — Inpatient Hospital Stay: Payer: PRIVATE HEALTH INSURANCE

## 2019-12-27 ENCOUNTER — Encounter: Payer: Self-pay | Admitting: Gastroenterology

## 2019-12-27 DIAGNOSIS — Z9049 Acquired absence of other specified parts of digestive tract: Secondary | ICD-10-CM

## 2019-12-27 DIAGNOSIS — K3189 Other diseases of stomach and duodenum: Secondary | ICD-10-CM

## 2019-12-27 DIAGNOSIS — K5190 Ulcerative colitis, unspecified, without complications: Secondary | ICD-10-CM

## 2019-12-27 DIAGNOSIS — K766 Portal hypertension: Secondary | ICD-10-CM

## 2019-12-27 DIAGNOSIS — K922 Gastrointestinal hemorrhage, unspecified: Secondary | ICD-10-CM

## 2019-12-27 DIAGNOSIS — K625 Hemorrhage of anus and rectum: Secondary | ICD-10-CM

## 2019-12-27 LAB — CBC AND DIFFERENTIAL
Baso # K/uL: 0 10*3/uL (ref 0.0–0.1)
Baso # K/uL: 0 10*3/uL (ref 0.0–0.1)
Basophil %: 0.2 %
Basophil %: 0.4 %
Eos # K/uL: 0.2 10*3/uL (ref 0.0–0.5)
Eos # K/uL: 0.2 10*3/uL (ref 0.0–0.5)
Eosinophil %: 2.1 %
Eosinophil %: 2.4 %
Hematocrit: 26 % — ABNORMAL LOW (ref 40–51)
Hematocrit: 28 % — ABNORMAL LOW (ref 40–51)
Hemoglobin: 8.6 g/dL — ABNORMAL LOW (ref 13.7–17.5)
Hemoglobin: 9.4 g/dL — ABNORMAL LOW (ref 13.7–17.5)
IMM Granulocytes #: 0 10*3/uL (ref 0.0–0.0)
IMM Granulocytes #: 0 10*3/uL (ref 0.0–0.0)
IMM Granulocytes: 0.5 %
IMM Granulocytes: 0.5 %
Lymph # K/uL: 0.8 10*3/uL — ABNORMAL LOW (ref 1.3–3.6)
Lymph # K/uL: 0.8 10*3/uL — ABNORMAL LOW (ref 1.3–3.6)
Lymphocyte %: 10 %
Lymphocyte %: 10.9 %
MCH: 30 pg/cell (ref 26–32)
MCH: 31 pg/cell (ref 26–32)
MCHC: 33 g/dL (ref 32–37)
MCHC: 33 g/dL (ref 32–37)
MCV: 93 fL — ABNORMAL HIGH (ref 79–92)
MCV: 93 fL — ABNORMAL HIGH (ref 79–92)
Mono # K/uL: 1 10*3/uL — ABNORMAL HIGH (ref 0.3–0.8)
Mono # K/uL: 1.1 10*3/uL — ABNORMAL HIGH (ref 0.3–0.8)
Monocyte %: 12.4 %
Monocyte %: 14.4 %
Neut # K/uL: 5.3 10*3/uL (ref 1.8–5.4)
Neut # K/uL: 6.1 10*3/uL — ABNORMAL HIGH (ref 1.8–5.4)
Nucl RBC # K/uL: 0 10*3/uL (ref 0.0–0.0)
Nucl RBC # K/uL: 0 10*3/uL (ref 0.0–0.0)
Nucl RBC %: 0 /100 WBC (ref 0.0–0.2)
Nucl RBC %: 0 /100 WBC (ref 0.0–0.2)
Platelets: 310 10*3/uL (ref 150–330)
Platelets: 345 10*3/uL — ABNORMAL HIGH (ref 150–330)
RBC: 2.8 MIL/uL — ABNORMAL LOW (ref 4.6–6.1)
RBC: 3 MIL/uL — ABNORMAL LOW (ref 4.6–6.1)
RDW: 17.8 % — ABNORMAL HIGH (ref 11.6–14.4)
RDW: 17.9 % — ABNORMAL HIGH (ref 11.6–14.4)
Seg Neut %: 71.4 %
Seg Neut %: 74.8 %
WBC: 7.4 10*3/uL (ref 4.2–9.1)
WBC: 8.1 10*3/uL (ref 4.2–9.1)

## 2019-12-27 LAB — RED BLOOD CELLS
Coded Blood type: 5100
Component blood type: O POS
Dispense status: TRANSFUSED

## 2019-12-27 LAB — PROTIME-INR
INR: 1 (ref 0.9–1.1)
Protime: 11.5 s (ref 10.0–12.9)

## 2019-12-27 LAB — COMPREHENSIVE METABOLIC PANEL
ALT: 87 U/L — ABNORMAL HIGH (ref 0–50)
AST: 141 U/L — ABNORMAL HIGH (ref 0–50)
Albumin: 2.6 g/dL — ABNORMAL LOW (ref 3.5–5.2)
Alk Phos: 595 U/L — ABNORMAL HIGH (ref 40–130)
Anion Gap: 11 (ref 7–16)
Bilirubin,Total: 22.6 mg/dL — ABNORMAL HIGH (ref 0.0–1.2)
CO2: 24 mmol/L (ref 20–28)
Calcium: 8.4 mg/dL — ABNORMAL LOW (ref 9.0–10.3)
Chloride: 103 mmol/L (ref 96–108)
Creatinine: 0.68 mg/dL (ref 0.67–1.17)
GFR,Black: 135 *
GFR,Caucasian: 117 *
Glucose: 120 mg/dL — ABNORMAL HIGH (ref 60–99)
Lab: 14 mg/dL (ref 6–20)
Potassium: 3.7 mmol/L (ref 3.3–5.1)
Sodium: 138 mmol/L (ref 133–145)
Total Protein: 4.9 g/dL — ABNORMAL LOW (ref 6.3–7.7)

## 2019-12-27 LAB — MAGNESIUM: Magnesium: 2.2 mg/dL (ref 1.6–2.5)

## 2019-12-27 LAB — TYPE AND SCREEN
ABO RH Blood Type: O POS
Antibody Screen: NEGATIVE

## 2019-12-27 LAB — APTT: aPTT: 26 s (ref 25.8–37.9)

## 2019-12-27 LAB — PHOSPHORUS: Phosphorus: 2.3 mg/dL — ABNORMAL LOW (ref 2.7–4.5)

## 2019-12-27 MED ORDER — MIDAZOLAM HCL 1 MG/ML IJ SOLN *I* WRAPPED
INTRAMUSCULAR | Status: AC | PRN
Start: 2019-12-27 — End: 2019-12-27
  Administered 2019-12-27: 2 mg via INTRAVENOUS
  Administered 2019-12-27 (×2): 1 mg via INTRAVENOUS
  Administered 2019-12-27: 2 mg via INTRAVENOUS

## 2019-12-27 MED ORDER — PLASMA-LYTE IV SOLN *WRAPPED*
100.0000 mL/h | Status: DC
Start: 2019-12-27 — End: 2019-12-27
  Administered 2019-12-27: 100 mL/h via INTRAVENOUS

## 2019-12-27 MED ORDER — FENTANYL CITRATE 50 MCG/ML IJ SOLN *WRAPPED*
INTRAMUSCULAR | Status: AC
Start: 2019-12-27 — End: 2019-12-27
  Filled 2019-12-27: qty 4

## 2019-12-27 MED ORDER — DIPHENHYDRAMINE HCL 50 MG/ML IJ SOLN *I*
INTRAMUSCULAR | Status: AC | PRN
Start: 2019-12-27 — End: 2019-12-27
  Administered 2019-12-27 (×2): 25 mg via INTRAVENOUS

## 2019-12-27 MED ORDER — MIDAZOLAM HCL 1 MG/ML IJ SOLN *I* WRAPPED
INTRAMUSCULAR | Status: AC
Start: 2019-12-27 — End: 2019-12-27
  Filled 2019-12-27: qty 10

## 2019-12-27 MED ORDER — FENTANYL CITRATE 50 MCG/ML IJ SOLN *WRAPPED*
INTRAMUSCULAR | Status: AC | PRN
Start: 2019-12-27 — End: 2019-12-27
  Administered 2019-12-27 (×4): 25 ug via INTRAVENOUS

## 2019-12-27 MED ORDER — HYDROCORTISONE ACETATE 25 MG RE SUPP *I*
25.0000 mg | Freq: Every day | RECTAL | Status: DC
Start: 2019-12-27 — End: 2019-12-29
  Administered 2019-12-27 – 2019-12-29 (×3): 25 mg via RECTAL
  Filled 2019-12-27 (×3): qty 1

## 2019-12-27 MED ORDER — DEXTROSE 5 % FLUSH FOR PUMPS *I*
0.0000 mL/h | INTRAVENOUS | Status: DC | PRN
Start: 2019-12-27 — End: 2020-01-01

## 2019-12-27 MED ORDER — EPINEPHRINE HCL 0.1 MG/ML IJ SOLUTION *WRAPPED*
INTRAMUSCULAR | Status: AC | PRN
Start: 2019-12-27 — End: 2019-12-27
  Administered 2019-12-27: .4 mg via SUBMUCOSAL

## 2019-12-27 MED ORDER — CIPROFLOXACIN HCL 500 MG PO TABS *I*
500.0000 mg | ORAL_TABLET | Freq: Two times a day (BID) | ORAL | Status: DC
Start: 2019-12-27 — End: 2020-01-05
  Administered 2019-12-27 – 2020-01-05 (×18): 500 mg via ORAL
  Filled 2019-12-27 (×18): qty 1

## 2019-12-27 MED ORDER — DIPHENHYDRAMINE HCL 50 MG/ML IJ SOLN *I*
INTRAMUSCULAR | Status: AC
Start: 2019-12-27 — End: 2019-12-27
  Filled 2019-12-27: qty 1

## 2019-12-27 MED ORDER — SODIUM CHLORIDE 0.9 % FLUSH FOR PUMPS *I*
0.0000 mL/h | INTRAVENOUS | Status: DC | PRN
Start: 2019-12-27 — End: 2020-01-01

## 2019-12-27 NOTE — Interval H&P Note (Signed)
UPDATES TO PATIENT'S CONDITION on the DAY OF SURGERY/PROCEDURE    I. Updates to Patient's Condition (to be completed by a provider privileged to complete a H&P, following reassessment of the patient by the provider):    Day of Surgery/Procedure Update:  History  History reviewed and no change    Physical  Physical exam updated and no change    Pre-Procedure Assessment  Airway Visibility: Posterior pharynx  Loose/Broken Teeth, Oral Piercings: Not Applicable  Lungs: Clear  Heart sounds rhythm: Regular Rate Rhythm  ASA Physical status rating: Class III: Severe systemic disease       II. Procedure Readiness   I have reviewed the patient's H&P and updated condition. By completing and signing this form, I attest that this patient is ready for surgery/procedure.    III. Attestation   I have reviewed the updated information regarding the patient's condition and it is appropriate to proceed with the planned surgery/procedure.    I have reminded Mr. Heaton that COVID-19 is still present in our community. He was advised that [facility] and its affiliates have made deliberate and widespread changes to policies and procedures, consistent with applicable directives, in order to reduce the risk of exposure in our facilities.     I further explained and Mr. Grauberger understands that given the communicability of the SARS CoV2 coronavirus, there remains a small but real risk of contracting the disease while receiving perioperative care - even with stringent preventive measures in place.   Mr. Kreisman understands the potential consequences of COVID-19 disease as it relates to their planned procedure and anticipated postoperative course.      The patient and I have considered and discussed the relative risks and benefits of proceeding with his/her surgery - both in terms of the procedure itself, and also in the context of the ongoing pandemic.  Mr. Biter wishes to proceed with the procedure.     Stasia Cavalier, MD as of 10:39 AM  12/27/2019

## 2019-12-27 NOTE — Procedures (Addendum)
Pouchoscopy Procedure Note   Date of Procedure: 12/27/2019   Referring Physician: Varney Biles  Primary Physician: Provider, Unknown, MD        Attending Physician: Stasia Cavalier, MD  Fellow:   Laroy Apple, MD  Indications:  BRBPR  In patient with UC s/p total colectomy with J pouch  Previous colonoscopy: Yes -- Date: 2019 from OSH  Medications: Fentanyl 100 mcg IV, Midazolam 6 mg IV and Diphenhydramine 50 mg IV were administered incrementally over the course of the procedure to achieve an adequate level of conscious sedation.    Moderate Sedation Face Times  Start Time: [id number]  End Time: 1157  Duration (minutes): 45 Minutes      I provided moderate sedation.  During this time I was face-to-face with the patient and supervising the RN; who monitored the patient's level of consciousness and physiological status.    [id number]    Procedure Details: Full disclosures of risks were reviewed with patient as detailed on the consent form. The patient was placed in the left lateral decubitus position and monitored with continuous pulse oximetry, interval blood pressure monitoring and direct observations.   After anorectal examination was performed, the scope was inserted into the pouch and advanced under direct vision 60cm. The procedure was considered not difficult.  A careful inspection was made as the scope was withdrawn. A retroflexed view was not performed; findings and interventions are described below. The patient was recovered in the GI recovery area  Scope Times:     Insertion: [place]  Exit: 1157      Findings:  The gastroscope was inserted into the pouch, and there was old blood and blood clot. This was washed extensively and all clot was attempted to be removed with suctioning and a Roth net. There was mild friability of the mucosa with scattered small erosions. At the site of an actively oozing area, 3cc of 1:100:000 epinephrine was injected. The scope was then inserted further. At the pouch  inlet, there was normal mucosa with mild friability but no ulcers or erosions. The efferent limb into the J tip was examined with normal mucosa. The afferent limb was intubated to 60cm with yellow/dark brown effluent and normal mucosa.     Intervention(s):    As above    Complication(s):   none  EBL: 0 ml    Impression(s):      All old blood and clot were collected in the pouch, which was mostly cleared.    Friable mucosa and scattered erosions in the pouch. Epinephrine was injected at areas of active oozing. No ulcers or erosions at the pouch inlet or neo-terminal ileum.     Recommendation(s):     Will start oral cipro 500mg  BID and also will start rectal hydrocortisone 25mg  once daily for ?pouchitis, though not typical appearance    Can restart clear liquid diet and advance as tolerated   Continue to monitor CBC BID and transfuse if hemoglobin < 7 or hematocrit < 21    Histopathologic Diagnosis:  not applicable    Laroy Apple, MD

## 2019-12-27 NOTE — Progress Notes (Addendum)
Transplant Hepatology Follow up    Admit Date:  12/20/2019    Interval Events:   Patient continues with bloody BMs  Plan for EGD and pouchoscopy today    Subjective:     Patient says that his pain was better controlled yesterday on oral dilaudid, and he did not require any IV narcotics. He continues with bloody BMs.     Medications:    Scheduled Meds:   pantoprazole  40 mg Intravenous Q12H    cefTRIAXone  1,000 mg Intravenous Q24H    acetaminophen  650 mg Oral BID    docusate sodium  100 mg Oral 2 times per day    mesalamine  4.8 g Oral Daily with breakfast     Continuous Infusions:   sodium chloride      sodium chloride 75 mL/hr (12/27/19 0008)     PRN Meds:.LORazepam, haloperidol lactate, HYDROmorphone **OR** HYDROmorphone, hydrOXYzine HCl, sodium chloride, dextrose    Physical examination:      BP 108/68 (BP Location: Right arm)    Pulse 90    Temp 37.5 C (99.5 F) (Oral)    Resp 18    Ht 175.3 cm (5' 9.02")    Wt 52.5 kg (115 lb 12.8 oz)    SpO2 96%    BMI 17.09 kg/m   General appearance: awake and alert, jaundiced, thin  Cardiovascular: regular rate and rhythm, S1, S2 normal, no murmurs.  Respiratory CTAB. No crackles or wheezes  Gastrointestinal: Normoactive bowel sounds. Abdomen soft, tender and nondistended  Skin: jaundice with excoriations   Neurological: grossly normal    LAB DATA:         Lab results: 12/27/19  0013 12/26/19  1559 12/26/19  0925 12/26/19  0102 12/25/19  2200 12/24/19  2347   WBC 7.4 7.1 7.2 8.7  --  7.0   Hemoglobin 8.6* 9.4* 8.5* 10.2* 9.7* 10.0*   Hematocrit 26* 29* 26* 32* 29* 29*   RBC 2.8* 3.1* 2.8* 3.5*  --  3.3*   Platelets 310 365* 340* 434*  --  337*           Lab results: 12/27/19  0013 12/26/19  0102 12/24/19  2347 12/24/19  0214 12/23/19  0128   Sodium 138 137 135 131* 134   Potassium 3.7 3.8 3.6 3.6 3.8   Chloride 103 101 100 96 100   CO2 24 24 24 22 20    UN 14 12 9 8 10    Creatinine 0.68 0.69 0.69 0.67 0.74   GFR,Caucasian 117 116 116 118 113   GFR,Black 135 134 134  136 130   Glucose 120* 118* 94 110* 101*   Calcium 8.4* 9.2 8.9* 9.2 8.8*   Total Protein 4.9* 5.6* 5.7* 6.0* 6.0*   Albumin 2.6* 3.0* 3.0* 3.2* 3.0*   ALT 87* 96* 84* 85* 85*   AST 141* 151* 135* 139* 130*   Alk Phos 595* 650* 610* 635* 623*   Bilirubin,Total 22.6* 25.4* 20.9* 21.4* 19.6*           Lab results: 12/27/19  0013   INR 1.0     MELD-Na score: 18 at 12/27/2019 12:13 AM  MELD score: 18 at 12/27/2019 12:13 AM  Calculated from:  Serum Creatinine: 0.68 mg/dL (Rounded to 1 mg/dL) at 12/27/2019 12:13 AM  Serum Sodium: 138 mmol/L (Rounded to 137 mmol/L) at 12/27/2019 12:13 AM  Total Bilirubin: 22.6 mg/dL at 12/27/2019 12:13 AM  INR(ratio): 1.0 at 12/27/2019 12:13 AM  Age: 44 years  2 months      IMAGING:   No results found.  ASSESSMENT/RECOMMENDATIONS:   44 yo M with PMHx [place] and UC s/p total colectomy w/ J pouch creation in 2010 who was transferred from OSH for further management of obstructive jaundice/worsening liver disease. Presented at OSH with complaints of increasing abd pain, jaundice, and darkened urine. His OSH course was notable for removal of previously placed CBD stent (12/04/19 as an outpatient) and was c/b new onset DVT/PE and he was started on a heparin drip. Hospital course here notable for ileus and concerning ill-defined heterogenous attenuation of right hepatic lobe on CT scan.      [place] now with new ill-defined heterogenous attenuation of right hepatic lobe-   - We discussed biopsy and we ultimately decided with the patient not to pursue biopsy  - Patient was listed for transplant but now is on internal hold given concerns for infiltrative cholangiocarcinoma  - Appreciate Medical Oncology's involvement. Likely with limited chemotherapy options given bilirubin of 20.9. Discussed imaging with advanced endoscopist, and limited utility in ERCP for stenting  - Appreciate Palliative Care involvement and patient is comfort-oriented     GI bleed:  - Patient continues with multiple bright red bloody  BMs overnight  - Holding Heparin    - Transfuse for hemoglobin < 7 or hematocrit < 21  - Continue PPI to IV BID  - Pending EGD and pouchoscopy today    Mild Ileus: resolved  - Currently NPO for procedures. After procedure, will restart clear liquids and advance as tolerated  - We will stop senna and Miralax. Continue Colace only    Concern for cholangitis:  - From notes, patient was at [facility] for ERCP on January 4th, 2021. He had a stent placed, and based on telephone notes, then became jaundiced 12/12/2019 at which point he went back to [place]. There, he had biliary stent removed. His most recent MR/MRCP at [facility] on 12/22/2019 shows intrahepatic duct beading, without main duct narrowing. Reviewed the imaging with advanced endoscopist, and the utility of stent in addressing hyperbilirubinemia is low. Will not plan for repeat ERCP here  - Patient continues on empiric Zosyn (was started at admission). Blood cultures are NGTD, now for SBP prophylaxis in setting of bleed     DVT/PE-   - Patient had bloody BMs, and heparin drip held   - After biopsy, we will need a longer term anticoagulation plan after addressing his GI bleed. Depending on our discussions with the patient, he may lean towards comfort oriented approach and no anticoagulation. Otherwise, as noted before, he has no PCP and if we were to place him on warfarin, it may be more difficult to titrate this medication for him. With apixaban, the patient is childs pugh C, and apixaban is not recommended in use of such patients. Lovenox might be the best option for him.      Abdominal pain:  - Currently on tylenol 660m every 8 hours PRN and has dilaudid 155mIV available every 4 hours PRN (he is only using dilaudid IV twice a day)  - Continue gabapentin 30058mt night (given his symptoms are most pronounced at night)   - Will add Palliative Care recommendations     Malnutrition  - Nutrition consult appreciated    UC s/p total colectomy w/ Jpouch creation 2010  -  continue home mesalamine    F: PO after procedures  E:daily labs, replete PRN  N:CLD and advance, after procedures  DVT FUX:NATFTDD gtt held as above  Bowel UKG:URKYHC   PUD ppx: IV PPI BID    Dispo: pending clinical course    Kyle Apple, MD  PGY-6, Gastroenterology and Hepatology Fellow    I saw and evaluated the patient. I agree with the resident's/fellow's findings and plan of care as documented above.    Kyle Cavalier, MD

## 2019-12-27 NOTE — Progress Notes (Signed)
Patient's chart reviewed for discharge planning needs. [facility] will continue to be available to coordinate discharge.    Wynelle Dreier, RN-BC  Acute Care Coordinator [id number]  Phone # [phone]  Pager # [phone]  Weekend Pager # [phone]

## 2019-12-27 NOTE — Progress Notes (Signed)
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 Palliative care consulted for symptom management and goals of care.    Subjective:  - Had a pouchoscopy today, found a bleed and it was cauterized  - he slept well last night and says his pain is much better controlled  - he still has a goal of being able to go home    Palliative Care ROS:  ROS negative except as noted above.      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 Palliative care consulted for symptom management and goals of care.    Pain/dyspnea  Had good effect with 4mg  and required 3 PRN doses in the past 24 hours.    - PO Dilaudid 4mg  PO for moderate pain  - PO Dilaudid 2mg  PO for severe pain    Anxiety/agitation/nausea  Haldol 0.5 mg IV Q3h prn for nausea  Lorazepam 0.5 mg IV Q1h prn for anxiety    Prevention of constipation  - Last stool: 1/25 AM  - continue miralax  - Bisacodyl supp daily prn     GOC/prognosis  FULL  HCP is [name], significant  other  Estimate prognosis: pending more information  Enrolled w/ [facility]ice : No    Nancy Nordmann, MD 12/27/2019

## 2019-12-27 NOTE — Procedures (Addendum)
EGD Procedure Note   Date of Procedure: 12/27/2019    Referring Physician: Varney Biles   Primary Care Provider: Provider, Unknown, MD   Attending Physician: Stasia Cavalier, MD  Fellow: Laroy Apple, MD  Indication(s): BRBPR    Medications: Fentanyl 100 mcg IV, Midazolam 6 mg IV and Diphenhydramine 50 mg IV were administered incrementally over the course of the procedure to achieve an adequate level of conscious sedation.  Moderate Sedation Face Times  Start Time: [id number]  End Time: 1157  Duration (minutes): 45 Minutes      I provided moderate sedation.  During this time I was face-to-face with the patient and supervising the RN; who monitored the patient's level of consciousness and physiological status.  Endoscope: [id number]  Accessories:None    Procedure Description: Full disclosure of risks were reviewed with patient as detailed on the consent form. The patient was placed in the left lateral decubitus position and monitored with continuous pulse oximetry, interval blood pressure monitoring and direct observations.   After adequate sedation, the endoscope was carefully introduced into the oropharynx and passed in to the esophagus under direct visualization. The esophagus and GE junction were carefully examined, including a measurement of the Z-line at 45 cm, GEJ at 45 cm and hiatus at 45 cm from the incisors. After advancement of the endoscope into the stomach, a careful examination was performed, including views of the antrum, incisura angularis, corpus and retroflexed views of the cardia and fundus.   The pylorus was then intubated without any difficulty and the endoscope was advanced to the second portion of the duodenum. Careful examination of the second portion of the duodenum and the bulb was then performed.  The stomach was then decompressed and the endoscope withdrawn.   Findings and intervention(s) are detailed below. The patient was recovered in the GI recovery area    Findings:      Esophagus:   Normal esophagus without varices, erosions or ulcers      Stomach:   No old or fresh blood upon entry   Mild PHG without active bleed   No ulcers or erosions   No gastric varices       Duodenum/small bowel:   No old or fresh blood with clear yellow effluent into D3   No ulcers or erosions    Intervention(s):   None    Complication(s):   none    EBL: 0 ml    Impression(s):  1. No upper GI source for bleed    Recommendation(s):   Proceed with pouchoscopy    Histopathologic Diagnosis:   not applicable    Laroy Apple, MD   I was present and I participated during the critical and key portions of this procedure, and I was immediately available during the remainder of the procedure.    Stasia Cavalier, MD

## 2019-12-28 ENCOUNTER — Other Ambulatory Visit: Payer: Self-pay

## 2019-12-28 ENCOUNTER — Inpatient Hospital Stay: Payer: PRIVATE HEALTH INSURANCE

## 2019-12-28 DIAGNOSIS — K921 Melena: Secondary | ICD-10-CM

## 2019-12-28 LAB — COMPREHENSIVE METABOLIC PANEL
ALT: 98 U/L — ABNORMAL HIGH (ref 0–50)
AST: 185 U/L — ABNORMAL HIGH (ref 0–50)
Albumin: 2.7 g/dL — ABNORMAL LOW (ref 3.5–5.2)
Alk Phos: 721 U/L — ABNORMAL HIGH (ref 40–130)
Anion Gap: 10 (ref 7–16)
Bilirubin,Total: 24.5 mg/dL — ABNORMAL HIGH (ref 0.0–1.2)
CO2: 25 mmol/L (ref 20–28)
Calcium: 8.6 mg/dL — ABNORMAL LOW (ref 9.0–10.3)
Chloride: 99 mmol/L (ref 96–108)
Creatinine: 0.63 mg/dL — ABNORMAL LOW (ref 0.67–1.17)
GFR,Black: 139 *
GFR,Caucasian: 121 *
Glucose: 114 mg/dL — ABNORMAL HIGH (ref 60–99)
Lab: 9 mg/dL (ref 6–20)
Potassium: 3.5 mmol/L (ref 3.3–5.1)
Sodium: 134 mmol/L (ref 133–145)
Total Protein: 5 g/dL — ABNORMAL LOW (ref 6.3–7.7)

## 2019-12-28 LAB — CBC AND DIFFERENTIAL
Baso # K/uL: 0 10*3/uL (ref 0.0–0.1)
Baso # K/uL: 0 10*3/uL (ref 0.0–0.1)
Baso # K/uL: 0 10*3/uL (ref 0.0–0.1)
Basophil %: 0.3 %
Basophil %: 0.2 %
Basophil %: 0.1 %
Eos # K/uL: 0.2 10*3/uL (ref 0.0–0.5)
Eos # K/uL: 0.2 10*3/uL (ref 0.0–0.5)
Eos # K/uL: 0.2 10*3/uL (ref 0.0–0.5)
Eosinophil %: 2.5 %
Eosinophil %: 2.7 %
Eosinophil %: 1.8 %
Hematocrit: 25 % — ABNORMAL LOW (ref 40–51)
Hematocrit: 24 % — ABNORMAL LOW (ref 40–51)
Hematocrit: 20 % — ABNORMAL LOW (ref 40–51)
Hemoglobin: 8.3 g/dL — ABNORMAL LOW (ref 13.7–17.5)
Hemoglobin: 7.9 g/dL — ABNORMAL LOW (ref 13.7–17.5)
Hemoglobin: 6.9 g/dL — ABNORMAL LOW (ref 13.7–17.5)
IMM Granulocytes #: 0.1 10*3/uL — ABNORMAL HIGH (ref 0.0–0.0)
IMM Granulocytes #: 0.1 10*3/uL — ABNORMAL HIGH (ref 0.0–0.0)
IMM Granulocytes #: 0.1 10*3/uL — ABNORMAL HIGH (ref 0.0–0.0)
IMM Granulocytes: 0.5 %
IMM Granulocytes: 0.6 %
IMM Granulocytes: 0.6 %
Lymph # K/uL: 0.6 10*3/uL — ABNORMAL LOW (ref 1.3–3.6)
Lymph # K/uL: 0.7 10*3/uL — ABNORMAL LOW (ref 1.3–3.6)
Lymph # K/uL: 0.7 10*3/uL — ABNORMAL LOW (ref 1.3–3.6)
Lymphocyte %: 6.6 %
Lymphocyte %: 8.4 %
Lymphocyte %: 8.8 %
MCH: 31 pg/cell (ref 26–32)
MCH: 31 pg/cell (ref 26–32)
MCH: 31 pg/cell (ref 26–32)
MCHC: 34 g/dL (ref 32–37)
MCHC: 34 g/dL (ref 32–37)
MCHC: 34 g/dL (ref 32–37)
MCV: 92 fL (ref 79–92)
MCV: 93 fL — ABNORMAL HIGH (ref 79–92)
MCV: 92 fL (ref 79–92)
Mono # K/uL: 1.1 10*3/uL — ABNORMAL HIGH (ref 0.3–0.8)
Mono # K/uL: 1.2 10*3/uL — ABNORMAL HIGH (ref 0.3–0.8)
Mono # K/uL: 1.1 10*3/uL — ABNORMAL HIGH (ref 0.3–0.8)
Monocyte %: 11.7 %
Monocyte %: 14.5 %
Monocyte %: 12.9 %
Neut # K/uL: 7.2 10*3/uL — ABNORMAL HIGH (ref 1.8–5.4)
Neut # K/uL: 6.1 10*3/uL — ABNORMAL HIGH (ref 1.8–5.4)
Neut # K/uL: 6.2 10*3/uL — ABNORMAL HIGH (ref 1.8–5.4)
Nucl RBC # K/uL: 0 10*3/uL (ref 0.0–0.0)
Nucl RBC # K/uL: 0 10*3/uL (ref 0.0–0.0)
Nucl RBC # K/uL: 0 10*3/uL (ref 0.0–0.0)
Nucl RBC %: 0 /100 WBC (ref 0.0–0.2)
Nucl RBC %: 0 /100 WBC (ref 0.0–0.2)
Nucl RBC %: 0 /100 WBC (ref 0.0–0.2)
Platelets: 295 10*3/uL (ref 150–330)
Platelets: 271 10*3/uL (ref 150–330)
Platelets: 252 10*3/uL (ref 150–330)
RBC: 2.7 MIL/uL — ABNORMAL LOW (ref 4.6–6.1)
RBC: 2.5 MIL/uL — ABNORMAL LOW (ref 4.6–6.1)
RBC: 2.2 MIL/uL — ABNORMAL LOW (ref 4.6–6.1)
RDW: 17.7 % — ABNORMAL HIGH (ref 11.6–14.4)
RDW: 17.8 % — ABNORMAL HIGH (ref 11.6–14.4)
RDW: 17.4 % — ABNORMAL HIGH (ref 11.6–14.4)
Seg Neut %: 78.4 %
Seg Neut %: 73.6 %
Seg Neut %: 75.8 %
WBC: 9.2 10*3/uL — ABNORMAL HIGH (ref 4.2–9.1)
WBC: 8.2 10*3/uL (ref 4.2–9.1)
WBC: 8.2 10*3/uL (ref 4.2–9.1)

## 2019-12-28 LAB — PHOSPHORUS: Phosphorus: 2.5 mg/dL — ABNORMAL LOW (ref 2.7–4.5)

## 2019-12-28 LAB — MAGNESIUM: Magnesium: 2 mg/dL (ref 1.6–2.5)

## 2019-12-28 LAB — PROTIME-INR
INR: 1.1 (ref 0.9–1.1)
Protime: 12.3 s (ref 10.0–12.9)

## 2019-12-28 MED ORDER — TECHNETIUM TC-99M LABELED RED BLOOD CELLS (ULTRATAG) *I*
18.0000 | PACK | Freq: Once | INTRAVENOUS | Status: AC
Start: 2019-12-28 — End: 2019-12-28
  Administered 2019-12-28: 20.6 via INTRAVENOUS

## 2019-12-28 MED ORDER — IOHEXOL 350 MG/ML (OMNIPAQUE) IV SOLN *I*
1.0000 mL | Freq: Once | INTRAVENOUS | Status: AC
Start: 2019-12-28 — End: 2019-12-28
  Administered 2019-12-28: 78 mL via INTRAVENOUS

## 2019-12-28 MED ORDER — HYDROXYZINE HCL 10 MG PO TABS *I*
10.0000 mg | ORAL_TABLET | Freq: Three times a day (TID) | ORAL | 0 refills | Status: DC | PRN
Start: 2019-12-28 — End: 2020-01-03
  Filled 2019-12-28: qty 30, 10d supply, fill #0

## 2019-12-28 MED ORDER — LORAZEPAM 1 MG PO TABS *I*
1.0000 mg | ORAL_TABLET | ORAL | 0 refills | Status: DC | PRN
Start: 2019-12-28 — End: 2020-01-03
  Filled 2019-12-28: qty 30, 6d supply, fill #0

## 2019-12-28 MED ORDER — BISACODYL 10 MG RE SUPP *I*
10.0000 mg | Freq: Every day | RECTAL | Status: DC | PRN
Start: 2019-12-28 — End: 2020-01-05
  Administered 2019-12-28 – 2019-12-31 (×2): 10 mg via RECTAL

## 2019-12-28 MED ORDER — HYDROCORTISONE ACETATE 25 MG RE SUPP *I*
25.0000 mg | Freq: Every day | RECTAL | 0 refills | Status: DC
Start: 2019-12-29 — End: 2020-01-03
  Filled 2019-12-28: qty 15, 15d supply, fill #0

## 2019-12-28 MED ORDER — DOCUSATE SODIUM 100 MG PO CAPS *I*
100.0000 mg | ORAL_CAPSULE | Freq: Two times a day (BID) | ORAL | 0 refills | Status: DC
Start: 2019-12-28 — End: 2020-01-03
  Filled 2019-12-28: qty 60, 30d supply, fill #0

## 2019-12-28 MED ORDER — SODIUM CHLORIDE 0.9 % IV SOLN WRAPPED *I*
3.0000 mL/h | Status: DC
Start: 2019-12-28 — End: 2019-12-30

## 2019-12-28 MED ORDER — CIPROFLOXACIN HCL 500 MG PO TABS *I*
500.0000 mg | ORAL_TABLET | Freq: Two times a day (BID) | ORAL | 0 refills | Status: DC
Start: 2019-12-28 — End: 2020-01-03
  Filled 2019-12-28: qty 24, 12d supply, fill #0

## 2019-12-28 MED ORDER — PANTOPRAZOLE SODIUM 40 MG PO TBEC *I*
40.0000 mg | DELAYED_RELEASE_TABLET | Freq: Two times a day (BID) | ORAL | 0 refills | Status: DC
Start: 2019-12-28 — End: 2020-01-03
  Filled 2019-12-28: qty 60, 30d supply, fill #0

## 2019-12-28 MED ORDER — HYDROMORPHONE HCL 2 MG PO TABS *I*
ORAL_TABLET | ORAL | 0 refills | Status: DC
Start: 2019-12-28 — End: 2019-12-28
  Filled 2019-12-28: qty 60, 5d supply, fill #0

## 2019-12-28 MED ORDER — ACETAMINOPHEN 325 MG PO TABS *I*
650.0000 mg | ORAL_TABLET | Freq: Two times a day (BID) | ORAL | 0 refills | Status: DC
Start: 2019-12-28 — End: 2020-01-03
  Filled 2019-12-28: qty 100, 25d supply, fill #0

## 2019-12-28 MED ORDER — HYDROMORPHONE HCL 2 MG PO TABS *I*
ORAL_TABLET | ORAL | 0 refills | Status: DC
Start: 2019-12-28 — End: 2020-01-03
  Filled 2019-12-28: qty 30, 5d supply, fill #0

## 2019-12-28 NOTE — Progress Notes (Signed)
[facility] SOCIAL WORK  PHARMACY FORM     Todays date:  December 28, 2019    Patient Name: Kyle Bright      Medical Record #: [id number]   DOB: February 06, 1976  Patients Address: [address], [place] Worker: Maryan Char, LMSW       Date of Service: December 28, 2019       Funding Source: [employer] Medication Assistance Fund     Hydrocortisone Suppository not covered under insurance   ___________________________________________________________________    Pharmacy Information:  Date/time sent: December 28, 2019     Time needed: Time of Discharge     Patient Location: Inpatient (specify unit)  [phone]    Medication Pick-up Preference: Have Discharge Pharmacy Team bring to unit     Pharmacy Contact: [place] work signature: Maryan Char, LMSW  Supervisor/Manager Approval (if indicated): [place] LMSW  Date: 12/28/19  (supervisor signature not required for Medicaid pending)

## 2019-12-28 NOTE — Progress Notes (Signed)
12/28/19 0935   [facility] Patient Class Review   Patient Class Review Inpatient     Patient class effective as of 12/28/2019.      Rayvon Char Ellanore Vanhook RN  Utilization Management  [phone]

## 2019-12-28 NOTE — Progress Notes (Signed)
Patient's chart reviewed for discharge planning needs. [facility] will continue to be available to coordinate discharge.    Trinton Prewitt, RN-BC  Acute Care Coordinator [id number]  Phone # [phone]  Pager # [phone]  Weekend Pager # [phone]

## 2019-12-28 NOTE — Progress Notes (Addendum)
Brief Interventional Radiology Note    Order for urgent embolization placed.     Review of chart reveals ratient with recent EGD demonstrating pouchitis. Multiple BMs today without blood. HH with mild downtrend over 24hs but remains hemodynamically stable. GFR normal. NM findings suggestive of GI bleed. Of note, Liver biopsy not performed earlier this week due to soft pressures.     Given the above, suspect venous bleed from pouchitis. CTA recommended to investigate for active arterial bleed. If positive, conventional angiography can be pursued.     Discussed with Lonia Mad via phone at 530pm.     Signed by: Nadene Rubins, MD  Interventional and Diagnostic Radiology PGY6  As of: 12/28/2019  at 5:30 PM      Thank you for consulting Interventional Radiology. Please page IR on call with questions or concerns.

## 2019-12-28 NOTE — Progress Notes (Signed)
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 Palliative care consulted for symptom management and goals of care.    Subjective:  - no further rectal bleeing  - constipated last night   -  pain is well controlled  -  planning to go home today    Palliative Care ROS:  ROS negative except as noted above.    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 He has not yet had liver bx for definitive dx due to altered coagulation and anemia. Palliative care was consulted for symptom management and goals of care.    Recommend:  Discharge on hydromorphone 2mg  1-2 tablets q 3h prn, plus lorazepam 1mg  q4h prn, miralax, bisacodyl supp daily prn.  Follow up with palliative care clinic for pain/symptom meds   I encouraged them to reach out to hospice in [place], to discuss code status, comfort care options and family support. Currently full code.   HCP isJessica Brower, significant other  Estimated prognosis: 2-6 mos, most likely <3 months considering cholangiocarcinoma  plus co-morbidities    Total Time Spent 35 minutes:   >50% of time was spent in counseling and/or coordination of care.     Jacquenette Shone, MD 12/28/2019               _______________________________________________________________  Patient Active Problem List   Diagnosis Code    PSC (primary sclerosing cholangitis) K83.01    Primary sclerosing cholangitis K83.01       Allergies   Allergen Reactions    Adhesive Tape Rash    Sulfa Antibiotics Rash     Rash on legs        Scheduled Meds:    ciprofloxacin  500 mg Oral 2 times per day    hydrocortisone  25 mg Rectal Daily    pantoprazole  40 mg Intravenous Q12H    acetaminophen  650 mg Oral BID    docusate sodium  100 mg Oral 2 times per day    mesalamine  4.8 g Oral Daily with breakfast       Continuous Infusions:    sodium chloride         PRN Meds:  bisacodyl, sodium chloride, dextrose, LORazepam, haloperidol lactate, HYDROmorphone **OR** HYDROmorphone, hydrOXYzine HCl, sodium chloride, dextrose

## 2019-12-28 NOTE — Progress Notes (Signed)
Received call from NM regarding Tagged RBC scan from today showing likely sm bowl bleed.  Will obtain CTA abd and pelvis per IR they will review re: venous vs. arterial bleed.  Kyle Bright has had two bloody bowl movements today HCT 24 from 25 this am. Will repeat CBC.  Dr. Kalman Drape is aware of plan.

## 2019-12-28 NOTE — Plan of Care (Signed)
Problem: Pain/Comfort  Goal: Patient's pain or discomfort is manageable  Outcome: Progressing towards goal     Problem: Nutrition  Goal: Patient's nutritional status is maintained or improved  Outcome: Progressing towards goal     Problem: Bowel Elimination  Goal: Elimination patterns are normal or improving  Outcome: Progressing towards goal     Problem: GI Bleeding Elimination  Goal: Elimination of patterns are normal or improving  Outcome: Progressing towards goal     Problem: Safety  Goal: Patient will remain free of falls  Outcome: Maintaining  Goal: Prevent any intentional injury  Outcome: Maintaining     Problem: Mobility  Goal: Patient's functional status is maintained or improved  Outcome: Maintaining     Problem: Psychosocial  Goal: Demonstrates ability to cope with illness  Outcome: Maintaining     Problem: Cognitive function  Goal: Cognitive function will be maintained or return to baseline  Outcome: Maintaining

## 2019-12-28 NOTE — Progress Notes (Addendum)
Transplant Hepatology Follow up    Admit Date:  12/20/2019    Interval Events:   Patient has had 2 BMs this morning without blood. H/H also stable  EGD and pouchoscopy done yesterday    Subjective:     Patient says that his pain was better controlled on oral narcotics. He has had 2 BMs this morning without blood. We discussed a discharge plan for him, as long as he remains without bloody BMs.     Medications:    Scheduled Meds:   ciprofloxacin  500 mg Oral 2 times per day    hydrocortisone  25 mg Rectal Daily    pantoprazole  40 mg Intravenous Q12H    acetaminophen  650 mg Oral BID    docusate sodium  100 mg Oral 2 times per day    mesalamine  4.8 g Oral Daily with breakfast     Continuous Infusions:   sodium chloride       PRN Meds:.bisacodyl, sodium chloride, dextrose, LORazepam, haloperidol lactate, HYDROmorphone **OR** HYDROmorphone, hydrOXYzine HCl, sodium chloride, dextrose    Physical examination:      BP 119/76 (BP Location: Right arm)    Pulse 95    Temp 37.5 C (99.5 F) (Oral)    Resp 20    Ht 175.3 cm (5' 9.02")    Wt 55 kg (121 lb 4.8 oz)    SpO2 98%    BMI 17.90 kg/m   General appearance: awake and alert, jaundiced, thin  Cardiovascular: regular rate and rhythm, S1, S2 normal, no murmurs.  Respiratory CTAB. No crackles or wheezes  Gastrointestinal: Normoactive bowel sounds. Abdomen soft, tender and nondistended  Skin: jaundice with excoriations   Neurological: grossly normal    LAB DATA:         Lab results: 12/28/19  0536 12/27/19  1437 12/27/19  0013 12/26/19  1559 12/26/19  0925   WBC 9.2* 8.1 7.4 7.1 7.2   Hemoglobin 8.3* 9.4* 8.6* 9.4* 8.5*   Hematocrit 25* 28* 26* 29* 26*   RBC 2.7* 3.0* 2.8* 3.1* 2.8*   Platelets 295 345* 310 365* 340*           Lab results: 12/28/19  0536 12/27/19  0013 12/26/19  0102 12/24/19  2347 12/24/19  0214   Sodium 134 138 137 135 131*   Potassium 3.5 3.7 3.8 3.6 3.6   Chloride 99 103 101 100 96   CO2 25 24 24 24 22    UN 9 14 12 9 8    Creatinine 0.63* 0.68 0.69  0.69 0.67   GFR,Caucasian 121 117 116 116 118   GFR,Black 139 135 134 134 136   Glucose 114* 120* 118* 94 110*   Calcium 8.6* 8.4* 9.2 8.9* 9.2   Total Protein 5.0* 4.9* 5.6* 5.7* 6.0*   Albumin 2.7* 2.6* 3.0* 3.0* 3.2*   ALT 98* 87* 96* 84* 85*   AST 185* 141* 151* 135* 139*   Alk Phos 721* 595* 650* 610* 635*   Bilirubin,Total 24.5* 22.6* 25.4* 20.9* 21.4*           Lab results: 12/28/19  0536   INR 1.1     MELD-Na score: 22 at 12/28/2019  5:36 AM  MELD score: 20 at 12/28/2019  5:36 AM  Calculated from:  Serum Creatinine: 0.63 mg/dL (Rounded to 1 mg/dL) at 12/28/2019  5:36 AM  Serum Sodium: 134 mmol/L at 12/28/2019  5:36 AM  Total Bilirubin: 24.5 mg/dL at 12/28/2019  5:36 AM  INR(ratio): 1.1 at 12/28/2019  5:36 AM  Age: 44 years 2 months      IMAGING:   No results found.  ASSESSMENT/RECOMMENDATIONS:   44 yo M with PMHx [place] and UC s/p total colectomy w/ J pouch creation in 2010 who was transferred from OSH for further management of obstructive jaundice/worsening liver disease. Presented at OSH with complaints of increasing abd pain, jaundice, and darkened urine. His OSH course was notable for removal of previously placed CBD stent (12/04/19 as an outpatient) and was c/b new onset DVT/PE and he was started on a heparin drip. Hospital course here notable for GI bleed and concerning ill-defined heterogenous attenuation of right hepatic lobe on CT scan.      [place] now with new ill-defined heterogenous attenuation of right hepatic lobe-   - We discussed biopsy and we ultimately decided with the patient not to pursue biopsy right now. But he is hesitant to make a definitive decision to forego biopsy, and would like to speak more with his partner. We discussed with him that we could also arrange for an outpatient biopsy if he were so to choose this.   - Patient was listed for transplant but now is on internal hold given concerns for infiltrative cholangiocarcinoma  - Appreciate Medical Oncology's involvement. Likely with  limited chemotherapy options given bilirubin of 20.9. Discussed imaging with advanced endoscopist, and limited utility in ERCP for stenting. We will arrange for outpatient follow-up.   - Appreciate Palliative Care involvement and patient is comfort-oriented. 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 Will continue those  - Patient had 2 BMs without blood this morning, but at noon, had 1 bloody BM. We will plan for discharge earliest tomorrow. Will monitor H/H today. If continues to bleed, may need CT angio  - Holding Heparin. We discussed anticoagulation, and will continue to hold in setting of bleed  - Transfuse for hemoglobin < 7 or hematocrit < 21  - Continue PPI BID    Mild Ileus: resolved  - We stopped senna and Miralax. Will hold Colace also in setting of bleed    Concern for cholangitis:  - From notes, patient was at [facility] for ERCP on January 4th, 2021. He had a stent placed, and based on telephone notes, then became jaundiced 12/12/2019 at which point he went back to [place]. There, he had biliary stent removed. His most recent MR/MRCP at [facility] on 12/22/2019 shows intrahepatic duct beading, without main duct narrowing. Reviewed the imaging with advanced endoscopist, and the utility of stent in addressing hyperbilirubinemia is low. Will not plan for repeat ERCP here  - We stopped empiric Zosyn (was started at admission) because blood cultures are NGTD     DVT/PE-   - Patient had bloody BMs, and heparin drip held  - We discussed with patient the short and long term risk/benefits for anticoagulation. We decided together that we'll wait for bleed to resolve, and as an outpatient will discuss starting anticoagulation or not. Depending on our discussions with the patient, he may lean towards comfort oriented approach and no anticoagulation as  outpatient. Otherwise, as noted before, he has no PCP and if we were to place him on warfarin, it may be more difficult to titrate this medication for him. With apixaban, the patient is [place] C, and apixaban  is not recommended in use of such patients. Lovenox might be the best option for him, plus if he decides to pursue biopsy after all, this could be stopped more easily in anticipation of biopsy    Abdominal pain:  - Currently on tylenol 656m every 8 hours   - Continue gabapentin 3055mat night (given his symptoms are most pronounced at night)   - Continue Palliative Care recommendations     Malnutrition  - Nutrition consult appreciated    UC s/p total colectomy w/ Jpouch creation 2010  - continue home mesalamine and started Cipro and hydrocortisone suppositories as above    F: PO    E:daily labs, replete PRN  N:Regular with supplements    DVT ppYIA:XKPVVZStt held as above  Bowel reMOL:MBEMLJ PUD ppx: PPI BID    Dispo: pending clinical course, possibly tomorrow    JaLaroy AppleMD  PGY-6, Gastroenterology and Hepatology Fellow  I saw and evaluated the patient. I agree with the resident's/fellow's findings and plan of care as documented above.    GOStasia CavalierMD

## 2019-12-29 ENCOUNTER — Inpatient Hospital Stay: Payer: PRIVATE HEALTH INSURANCE | Admitting: Gastroenterology

## 2019-12-29 ENCOUNTER — Encounter: Payer: Self-pay | Admitting: Gastroenterology

## 2019-12-29 DIAGNOSIS — K626 Ulcer of anus and rectum: Secondary | ICD-10-CM

## 2019-12-29 LAB — CBC AND DIFFERENTIAL
Baso # K/uL: 0 10*3/uL (ref 0.0–0.1)
Baso # K/uL: 0 10*3/uL (ref 0.0–0.1)
Basophil %: 0.2 %
Basophil %: 0.2 %
Eos # K/uL: 0.1 10*3/uL (ref 0.0–0.5)
Eos # K/uL: 0 10*3/uL (ref 0.0–0.5)
Eosinophil %: 1.5 %
Eosinophil %: 0.4 %
Hematocrit: 25 % — ABNORMAL LOW (ref 40–51)
Hematocrit: 26 % — ABNORMAL LOW (ref 40–51)
Hemoglobin: 8.4 g/dL — ABNORMAL LOW (ref 13.7–17.5)
Hemoglobin: 8.7 g/dL — ABNORMAL LOW (ref 13.7–17.5)
IMM Granulocytes #: 0 10*3/uL (ref 0.0–0.0)
IMM Granulocytes #: 0.1 10*3/uL — ABNORMAL HIGH (ref 0.0–0.0)
IMM Granulocytes: 0.4 %
IMM Granulocytes: 0.6 %
Lymph # K/uL: 0.6 10*3/uL — ABNORMAL LOW (ref 1.3–3.6)
Lymph # K/uL: 0.5 10*3/uL — ABNORMAL LOW (ref 1.3–3.6)
Lymphocyte %: 6.7 %
Lymphocyte %: 5.8 %
MCH: 30 pg/cell (ref 26–32)
MCH: 31 pg/cell (ref 26–32)
MCHC: 33 g/dL (ref 32–37)
MCHC: 33 g/dL (ref 32–37)
MCV: 91 fL (ref 79–92)
MCV: 93 fL — ABNORMAL HIGH (ref 79–92)
Mono # K/uL: 1.1 10*3/uL — ABNORMAL HIGH (ref 0.3–0.8)
Mono # K/uL: 1 10*3/uL — ABNORMAL HIGH (ref 0.3–0.8)
Monocyte %: 12.1 %
Monocyte %: 11.2 %
Neut # K/uL: 7.4 10*3/uL — ABNORMAL HIGH (ref 1.8–5.4)
Neut # K/uL: 7.6 10*3/uL — ABNORMAL HIGH (ref 1.8–5.4)
Nucl RBC # K/uL: 0 10*3/uL (ref 0.0–0.0)
Nucl RBC # K/uL: 0 10*3/uL (ref 0.0–0.0)
Nucl RBC %: 0 /100 WBC (ref 0.0–0.2)
Nucl RBC %: 0 /100 WBC (ref 0.0–0.2)
Platelets: 313 10*3/uL (ref 150–330)
Platelets: 318 10*3/uL (ref 150–330)
RBC: 2.8 MIL/uL — ABNORMAL LOW (ref 4.6–6.1)
RBC: 2.8 MIL/uL — ABNORMAL LOW (ref 4.6–6.1)
RDW: 17.6 % — ABNORMAL HIGH (ref 11.6–14.4)
RDW: 18.6 % — ABNORMAL HIGH (ref 11.6–14.4)
Seg Neut %: 79.1 %
Seg Neut %: 81.8 %
WBC: 9.3 10*3/uL — ABNORMAL HIGH (ref 4.2–9.1)
WBC: 9.3 10*3/uL — ABNORMAL HIGH (ref 4.2–9.1)

## 2019-12-29 LAB — COMPREHENSIVE METABOLIC PANEL
ALT: 105 U/L — ABNORMAL HIGH (ref 0–50)
AST: 208 U/L — ABNORMAL HIGH (ref 0–50)
Albumin: 2.6 g/dL — ABNORMAL LOW (ref 3.5–5.2)
Alk Phos: 792 U/L — ABNORMAL HIGH (ref 40–130)
Anion Gap: 9 (ref 7–16)
Bilirubin,Total: 24.2 mg/dL — ABNORMAL HIGH (ref 0.0–1.2)
CO2: 26 mmol/L (ref 20–28)
Calcium: 8.5 mg/dL — ABNORMAL LOW (ref 9.0–10.3)
Chloride: 101 mmol/L (ref 96–108)
Creatinine: 0.64 mg/dL — ABNORMAL LOW (ref 0.67–1.17)
GFR,Black: 138 *
GFR,Caucasian: 120 *
Glucose: 109 mg/dL — ABNORMAL HIGH (ref 60–99)
Lab: 8 mg/dL (ref 6–20)
Potassium: 3.6 mmol/L (ref 3.3–5.1)
Sodium: 136 mmol/L (ref 133–145)
Total Protein: 4.8 g/dL — ABNORMAL LOW (ref 6.3–7.7)

## 2019-12-29 LAB — PHOSPHORUS: Phosphorus: 2.9 mg/dL (ref 2.7–4.5)

## 2019-12-29 LAB — COVID-19 NAAT (PCR): COVID-19 NAAT (PCR): NEGATIVE

## 2019-12-29 LAB — MAGNESIUM: Magnesium: 2 mg/dL (ref 1.6–2.5)

## 2019-12-29 LAB — PROTIME-INR
INR: 1.2 — ABNORMAL HIGH (ref 0.9–1.1)
Protime: 13.3 s — ABNORMAL HIGH (ref 10.0–12.9)

## 2019-12-29 LAB — PERFORMING LAB

## 2019-12-29 LAB — COVID-19 PCR

## 2019-12-29 MED ORDER — MIDAZOLAM HCL 1 MG/ML IJ SOLN *I* WRAPPED
INTRAMUSCULAR | Status: AC | PRN
Start: 2019-12-29 — End: 2019-12-29
  Administered 2019-12-29 (×3): 1 mg via INTRAVENOUS
  Administered 2019-12-29: 2 mg via INTRAVENOUS
  Administered 2019-12-29: 1 mg via INTRAVENOUS

## 2019-12-29 MED ORDER — SIMETHICONE 80 MG PO CHEW *I*
80.0000 mg | CHEWABLE_TABLET | Freq: Four times a day (QID) | ORAL | Status: DC
Start: 2019-12-29 — End: 2020-01-05
  Administered 2019-12-29 – 2020-01-05 (×27): 80 mg via ORAL
  Filled 2019-12-29 (×27): qty 1

## 2019-12-29 MED ORDER — MESALAMINE 1.2 GM PO TBEC *I*
4.8000 g | DELAYED_RELEASE_TABLET | Freq: Every day | ORAL | Status: DC
Start: 2019-12-30 — End: 2020-01-04
  Administered 2019-12-30 – 2020-01-04 (×4): 4.8 g via ORAL
  Filled 2019-12-29 (×6): qty 4

## 2019-12-29 MED ORDER — SODIUM CHLORIDE 0.9 % FLUSH FOR PUMPS *I*
0.0000 mL/h | INTRAVENOUS | Status: DC | PRN
Start: 2019-12-29 — End: 2020-01-05

## 2019-12-29 MED ORDER — HYDROMORPHONE HCL PF 1 MG/ML IJ SOLN *WRAPPED*
0.5000 mg | Freq: Once | INTRAMUSCULAR | Status: AC
Start: 2019-12-29 — End: 2019-12-29
  Administered 2019-12-29: 0.5 mg via INTRAVENOUS

## 2019-12-29 MED ORDER — FENTANYL CITRATE 50 MCG/ML IJ SOLN *WRAPPED*
INTRAMUSCULAR | Status: AC | PRN
Start: 2019-12-29 — End: 2019-12-29
  Administered 2019-12-29 (×4): 25 ug via INTRAVENOUS

## 2019-12-29 MED ORDER — FENTANYL CITRATE 50 MCG/ML IJ SOLN *WRAPPED*
INTRAMUSCULAR | Status: AC
Start: 2019-12-29 — End: 2019-12-29
  Filled 2019-12-29: qty 4

## 2019-12-29 MED ORDER — PLASMA-LYTE IV SOLN *WRAPPED*
100.0000 mL/h | Status: DC
Start: 2019-12-29 — End: 2019-12-30
  Administered 2019-12-29: 100 mL/h via INTRAVENOUS

## 2019-12-29 MED ORDER — MIDAZOLAM HCL 1 MG/ML IJ SOLN *I* WRAPPED
INTRAMUSCULAR | Status: AC
Start: 2019-12-29 — End: 2019-12-29
  Filled 2019-12-29: qty 10

## 2019-12-29 MED ORDER — DEXTROSE 5 % FLUSH FOR PUMPS *I*
0.0000 mL/h | INTRAVENOUS | Status: DC | PRN
Start: 2019-12-29 — End: 2020-01-05

## 2019-12-29 MED ORDER — LIDOCAINE 5 % EX PTCH *I*
1.0000 | MEDICATED_PATCH | CUTANEOUS | Status: DC
Start: 2019-12-29 — End: 2020-01-05
  Administered 2019-12-29 – 2020-01-04 (×7): 1 via TRANSDERMAL
  Filled 2019-12-29 (×9): qty 1

## 2019-12-29 MED ORDER — HYDROMORPHONE HCL PF 1 MG/ML IJ SOLN *WRAPPED*
INTRAMUSCULAR | Status: AC
Start: 2019-12-29 — End: 2019-12-29
  Filled 2019-12-29: qty 1

## 2019-12-29 MED ORDER — HYDROCORTISONE ACE-PRAMOXINE 1-1 % RE FOAM *I*
1.0000 | Freq: Two times a day (BID) | CUTANEOUS | Status: DC
Start: 2019-12-29 — End: 2019-12-30
  Administered 2019-12-29 – 2019-12-30 (×2): 1 via RECTAL
  Filled 2019-12-29 (×2): qty 10

## 2019-12-29 MED ORDER — HYDROCORTISONE ACETATE 90 MG/DOSE RE FOAM *I*
1.0000 | Freq: Two times a day (BID) | CUTANEOUS | Status: DC
Start: 2019-12-29 — End: 2019-12-29
  Filled 2019-12-29: qty 15

## 2019-12-29 MED ORDER — EPINEPHRINE HCL 0.1 MG/ML IJ SOLUTION *WRAPPED*
INTRAMUSCULAR | Status: AC | PRN
Start: 2019-12-29 — End: 2019-12-29
  Administered 2019-12-29: .03 mg via SUBMUCOSAL

## 2019-12-29 NOTE — Progress Notes (Addendum)
PHYSICAL THERAPY PROGRESS NOTE       12/29/19 1343   Evaluation Attempted   Evaluation Attempt Comment Attempted to see pt for PT eval. Pt reports he is leaving for test shortly therefore eval deferred. Of note, pt interested in any care that could help address his back pain. Will follow up with pt tomorrow.   Pamala Hurry Kaiesha Tonner,PT/2096    Writer contacted palliative care fellow re: pt receiving massage therapy for back pain. Fellow reports that massage therapy for pts is on hold d/t covid. Pamala Hurry Genia Perin,PT/2096

## 2019-12-29 NOTE — Preop H&P (Signed)
UPDATES TO PATIENT'S CONDITION on the DAY OF SURGERY/PROCEDURE    For the extent of the COVID-19 pandemic, [facility] is avoiding physical signatures on consent forms in an effort to reduce transmission of COVID-19. I have reviewed the procedure, risks, and alternatives documented above with the patient. All questions have been answered. The patient expresses understanding and has verbally consented to the procedure and for blood transfusions in the event of an emergency during the procedure.      I. Updates to Patient's Condition (to be completed by a provider privileged to complete a H&P, following reassessment of the patient by the provider):    Day of Surgery/Procedure Update:  History  (Inpatients only): I confirm that progress notes within the past 24 hours document updates to the patient's condition.    Physical  (Inpatients only): I confirm that progress notes within the past 24 hours document updates to the patient's condition.    Pre-Procedure Assessment  Airway Visibility: Uvula  Loose/Broken Teeth, Oral Piercings: Not Applicable  Lungs: Clear  Heart sounds rhythm: Regular Rate Rhythm  ASA Physical status rating: Class II: Mild systemic disease       II. Procedure Readiness   I have reviewed the patient's H&P and updated condition. By completing and signing this form, I attest that this patient is ready for surgery/procedure.    III. Attestation   I have reviewed the updated information regarding the patient's condition and it is appropriate to proceed with the planned surgery/procedure.     Laroy Apple, MD as of 3:02 PM 12/29/2019

## 2019-12-29 NOTE — Procedures (Addendum)
Pouchoscopy Procedure Note   Date of Procedure: 12/29/2019   Referring Physician: Varney Biles  Primary Physician: Provider, Unknown, MD        Attending Physician: Stasia Cavalier, MD  Fellow:   Laroy Apple, MD  Indications:  BRBPR  Previous colonoscopy: Yes -- Date: 12/27/2019  Medications: Fentanyl 100 mcg IV and Midazolam 6 mg IV were administered incrementally over the course of the procedure to achieve an adequate level of conscious sedation.    Moderate Sedation Face Times  Start Time: (S) 1512  End Time: 1543  Duration (minutes): 31 Minutes        I provided moderate sedation.  During this time I was face-to-face with the patient and supervising the RN; who monitored the patient's level of consciousness and physiological status.    [id number]    Procedure Details:   Full disclosures of risks were reviewed with patient as detailed on the consent form. The patient was placed in the left lateral decubitus position and monitored with continuous pulse oximetry, interval blood pressure monitoring and direct observations.   After anorectal examination was performed, the scope was inserted into the pouch and advanced under direct vision 60cm. The procedure was considered not difficult.  A careful inspection was made as the scope was withdrawn. A retroflexed view was not performed; findings and interventions are described below. The patient was recovered in the GI recovery area  Scope Times:     Insertion: 1520  Exit: 1540    Findings:  The gastroscope was inserted into the pouch, and again there was old blood and clot. This was washed and suctioned completely to reveal the underlying mucosa. The pouch mucosa appeared normal though friable. Just distal to the anal cuff, there was one ulcer about 41mm in size with active oozing of blood. We injected 3cc of 1:10,000 . We then applied a 7 Fr bicap cautery to the ulcer and two areas of pinpoint oozing just adjacent to the ulcer where we had injected  epinephrine. There was friable mucosa throughout the pouch, and we applied APC to those areas.     Beyond the pouch, there was brown stool and no old or fresh blood. No ulcers or erosions. We intubated the afferent limb to 40cm.     Intervention(s):      As above      Complication(s):     None    EBL: <5 ml    Impression(s):     One small actively oozing ulcer in the pouch, treated with epinephrine and bicap cautery.    Friable mucosa s/p APC of erythematous, oozing areas in the pouch   No old or fresh blood beyond the pouch. Brown stool further proximal. No ulcers or erosions proximal to the pouch.     Recommendation(s):     Continue to monitor H/H BID and transfuse as needed   Give patient hydrocortisone foam and continue Cipro   Ok to place patient back on regular diet   Will not pursue patency capsule and VCE evaluation as of now as all the stool seen was brown, and there was no old or fresh blood proximal to the pouch. Source of bleed is likely pouch.     Updated [name], patient's partner, by phone     Histopathologic Diagnosis:  none    Laroy Apple, MD    I observed and participated in the key portions of the procedure of this patient with Dr. Fletcher Anon. I was present throughout the procedure. No  complications were noted and the patient tolerated the procedure well.    Stasia Cavalier, MD

## 2019-12-29 NOTE — Consults (Signed)
Medical Nutrition Therapy- Follow Up    Current nutrition prescription: NPO time specific, regular diet. Ensure surgery shake bid    Access: PIV    Patient Summary: 44 yo M with PMHx [place] and UC s/p total colectomy w/ J pouch creation in 2010 who was transferred from OSH for further management of obstructive jaundice/worsening liver disease.    Interval Hx: met with patient yesterday, was anxious to go home, eating as well as he is able to in the hospital, does better at home. Had BM with blood being evaluated    Anthropometrics:   Height: 175.3 cm (5' 9.02")    Current Weight: 53 kg (116 lb 14.4 oz)   Admit Wt:53.5 kg  IBW: 72.7 kg + 10%  BMI: 17.26 kg/m2  On current weight        Wt change: none noted from last assessment    Estimated Nutritional Needs (based on54.4g)based on uBW   kcal 1630-1900kcal/day (30-35kcal/kg)   g protein 65-82g protein/day (1.2-1.5g protein/kg)   mL fluid 1630-1939m/day (30-367mkg)         Nutrition Assessment and Diagnosis:   Chart reviewed; labs, MAR, and I/O's reviewed.   Scheduled Meds:   ciprofloxacin  500 mg Oral 2 times per day    hydrocortisone  25 mg Rectal Daily    pantoprazole  40 mg Intravenous Q12H    acetaminophen  650 mg Oral BID    docusate sodium  100 mg Oral 2 times per day    mesalamine  4.8 g Oral Daily with breakfast     Lab results: 12/28/19  0536 12/27/19  0013 12/26/19  0102 12/24/19  2347 12/24/19  0214   Sodium 134 138 137 135 131*   Potassium 3.5 3.7 3.8 3.6 3.6   Chloride 99 103 101 100 96   CO2 _0 UN _1 Creatinine 0.63* 0.68 0.69 0.69 0.67   GFR,Caucasian 121 117 116 116 118   GFR,Black 139 135 134 134 136   Glucose 114* 120* 118* 94 110*   Calcium 8.6* 8.4* 9.2 8.9* 9.2   Total Protein 5.0* 4.9* 5.6* 5.7* 6.0*   Albumin 2.7* 2.6* 3.0* 3.0* 3.2*   ALT 98* 87* 96* 84* 85*   AST 185* 141* 151* 135* 139*   Alk Phos 721* 595* 650* 610* 635*   Bilirubin,Total 24.5* 22.6* 25.4* 20.9* 21.4*       Patient with no edema   Pt  with x5 BM's/day. 1/28       Malnutrition Status: Assessed on 12/28/19. Pt with evidence of moderate malnutrition related to current illness as evidenced by moderate loss of muscle mass, moderate/severe loss of subcutaneous fat stores    Plan was to trial MCT oil 15 ml tid with meals to improve absorption with short bowel. Noted order d/c'd due to medication not available. Receiving nutrition supplement ensure surgery shake bid which patient has received and agreed to try, this po supplement has MCT. He also was receiving [employer] shake from OSH that he accepted well that has MCT.     Malnutrition Status: moderate malnutrition, identified 7/20, current malnutrition present on admission  At that time(7/20) nutrition education provided for low sodium high calorie/high protein (1.5 g/kg IBW) which patient said he did follow and tolerated but unable to gain weight     Nutrition Intervention:   1.   Continue current nutrition prescription as ordered.   2.  Continue ensure surgery shake bid      Nutrition Monitoring/Evaluation:   1. Will monitor diet tolerance and intake, nutrition-related labs, weight trend, BM pattern, supplement acceptance.   2. will follow up per high nutrition risk protocol.      MS,  RDN, CDN, CSR, FNKF  Liver Transplant Dietitian  PIC # [id number]

## 2019-12-29 NOTE — Progress Notes (Addendum)
Transplant Hepatology Follow up    Admit Date:  12/20/2019    Interval Events:   Patient continued with bloody BMs overnight and required 1 unit pRBC    Subjective:     We discussed GI bleed and the work-up that was done so far (EGD, pouchoscopy, CTA, tagged RBC scan). Patient's significant other, [name], was on speakerphone. We discussed repeating the pouchoscopy and further treating any seen bleeding spots. We also discussed that if the bleed were more proximal in the small bowel, the work-up would typically consist of patency capsule (there is some mention of ileus and small bowel obstruction on CT, but patient not clinically obstructed) then VCE and if VCE found small bowel bleed, we described risks of DBE. We mentioned all of this in context of patient's ongoing wishes to be comfortable, but ultimately to go home and live out a meaningful life at home.     Patient does want everything done for bleed, at least at this time.     We also discussed biopsy again, as patient had brought up yesterday that he still has some hope that the right liver lobe lesion is not cholangiocarcinoma and having 250% certainty may be of benefit. However, after discussion, we'll take this a step at a time and address bleed first.     Medications:    Scheduled Meds:   simethicone  80 mg Oral 4x Daily    [START ON 12/30/2019] mesalamine  4.8 g Oral Daily with breakfast    ciprofloxacin  500 mg Oral 2 times per day    hydrocortisone  25 mg Rectal Daily    pantoprazole  40 mg Intravenous Q12H    acetaminophen  650 mg Oral BID     Continuous Infusions:   sodium chloride      sodium chloride       PRN Meds:.bisacodyl, sodium chloride, dextrose, LORazepam, haloperidol lactate, HYDROmorphone **OR** HYDROmorphone, hydrOXYzine HCl, sodium chloride, dextrose    Physical examination:      BP 111/65 (BP Location: Right arm)    Pulse 89    Temp 36.8 C (98.2 F) (Oral)    Resp 18    Ht 175.3 cm (5' 9.02")    Wt 53 kg (116 lb 14.4 oz)    SpO2  99%    BMI 17.26 kg/m   General appearance: awake and alert, jaundiced, thin  Cardiovascular: regular rate and rhythm, S1, S2 normal, no murmurs.  Respiratory CTAB. No crackles or wheezes  Gastrointestinal: Normoactive bowel sounds. Abdomen soft, tender and very mildly distended   Skin: jaundice with excoriations   Neurological: grossly normal    LAB DATA:         Lab results: 12/29/19  0440 12/28/19  2208 12/28/19  1218 12/28/19  0536 12/27/19  1437   WBC 9.3* 8.2 8.2 9.2* 8.1   Hemoglobin 8.4* 6.9* 7.9* 8.3* 9.4*   Hematocrit 25* 20* 24* 25* 28*   RBC 2.8* 2.2* 2.5* 2.7* 3.0*   Platelets 313 252 271 295 345*           Lab results: 12/29/19  0440 12/28/19  0536 12/27/19  0013 12/26/19  0102 12/24/19  2347   Sodium 136 134 138 137 135   Potassium 3.6 3.5 3.7 3.8 3.6   Chloride 101 99 103 101 100   CO2 _0 UN _1 Creatinine 0.64* 0.63* 0.68 0.69 0.69   GFR,Caucasian 120 121 117  116 116   GFR,Black 138 139 135 134 134   Glucose 109* 114* 120* 118* 94   Calcium 8.5* 8.6* 8.4* 9.2 8.9*   Total Protein 4.8* 5.0* 4.9* 5.6* 5.7*   Albumin 2.6* 2.7* 2.6* 3.0* 3.0*   ALT 105* 98* 87* 96* 84*   AST 208* 185* 141* 151* 135*   Alk Phos 792* 721* 595* 650* 610*   Bilirubin,Total 24.2* 24.5* 22.6* 25.4* 20.9*           Lab results: 12/29/19  0440   INR 1.2*     MELD-Na score: 22 at 12/29/2019  4:40 AM  MELD score: 21 at 12/29/2019  4:40 AM  Calculated from:  Serum Creatinine: 0.64 mg/dL (Rounded to 1 mg/dL) at 12/29/2019  4:40 AM  Serum Sodium: 136 mmol/L at 12/29/2019  4:40 AM  Total Bilirubin: 24.2 mg/dL at 12/29/2019  4:40 AM  INR(ratio): 1.2 at 12/29/2019  4:40 AM  Age: 44 years 2 months      IMAGING:   Nm Gi Bleed Imaging Tagged Rbc    Result Date: 12/28/2019  Findings consistent with active GI bleed likely from small bowel. Findings were discussed with JACQUELINE P CULLEN via phone on 12/28/2019 5:11 PM. END OF IMPRESSION    Ct Angio Abdomen And Pelvis    Result Date: 12/28/2019  1, No acute GI bleed is  visualized on CT exam. 2. No significant change in ill-defined heterogeneous liver lesions with overlying capsular retraction. This is concerning for cholangiocarcinoma with abscess and metastasis considered less likely. 3. Sequela of known biliary PSC. 4. Soft tissue thickening within the upper retroperitoneum and throughout the mesentery, likely confluent adenopathy. 5. Unchanged mild splenomegaly. 6. Interval increase in gallbladder distention. 7. Slight interval decrease in the extent of the small bowel wall dilatation, which may be due to an evolving ileus, likely within a small bowel obstruction. This can be followed on further imaging, including plain films.2 8. Sequela of fluid overload including small bilateral pleural effusions, body wall anasarca, and intra-abdominal ascites. END OF IMPRESSION I have personally reviewed the images and the Resident's/Fellow's interpretation and agree with or edited the findings. [facility] submits this DICOM format image data and final report to the [facility], an independent secure electronic health information exchange, on a reciprocally searchable basis (with patient authorization) for a minimum of 12 months after exam date.    ASSESSMENT/RECOMMENDATIONS:   44 yo M with PMHx [place] and UC s/p total colectomy w/ J pouch creation in 2010 who was transferred from OSH for further management of obstructive jaundice/worsening liver disease. Presented at OSH with complaints of increasing abd pain, jaundice, and darkened urine. His OSH course was notable for removal of previously placed CBD stent (12/04/19 as an outpatient) and was c/b new onset DVT/PE and he was started on a heparin drip. Hospital course here notable for GI bleed and concerning ill-defined heterogenous attenuation of right hepatic lobe on CT scan. MELD 22, blood group O.     PSC now with new ill-defined heterogenous attenuation of right hepatic lobe-   - We had discussed biopsy and we ultimately decided  with the patient not to pursue biopsy right now.  Please see previous notes regarding discussions  - Patient was listed for transplant but now is on internal hold given concerns for infiltrative cholangiocarcinoma  - Appreciate Medical Oncology's involvement. Likely with limited chemotherapy options given bilirubin of 20.9. Discussed imaging with advanced endoscopist, and limited utility in ERCP for stenting. We  will arrange for outpatient follow-up.   - Appreciate Palliative Care involvement and patient is comfort-oriented and now DNR/DNI. We will arrange for outpatient follow-up     GI bleed:  - Continues to bleed with hydrocortisone suppositories and Cipro. We will change suppository to foam and repeat pouchoscopy today   - CT angio without bleed, Tagged RBC with blush at left flank - small bowel bleed. Please see the subjective regarding discussions we had about patency, then VCE and where to go from there  - Holding anticoagulation in setting of bleed  - Transfuse for hemoglobin < 7 or hematocrit < 21  - Continue PPI BID    Mild Ileus: resolved  - We stopped senna, Miralax and colace. Patient is not clinically with ileus or obstructed though imaging is suggestive    Concern for cholangitis:  - From notes, patient was at [facility] for ERCP on January 4th, 2021. He had a stent placed, and based on telephone notes, then became jaundiced 12/12/2019 at which point he went back to [place]. There, he had biliary stent removed. His most recent MR/MRCP at [facility] on 12/22/2019 shows intrahepatic duct beading, without main duct narrowing. Reviewed the imaging with advanced endoscopist, and the utility of stent in addressing hyperbilirubinemia is low. Will not plan for repeat ERCP here  - We stopped empiric Zosyn (was started at admission) because blood cultures are NGTD     DVT/PE-   - Patient had bloody BMs, and heparin drip held  - We previously had discussed with patient the short and long term risk/benefits for  anticoagulation. We decided together that we'll wait for bleed to resolve, and as an outpatient will discuss starting anticoagulation or not. Depending on our discussions with the patient, he may lean towards comfort measures only and no anticoagulation as outpatient. Otherwise, as noted before, he has no PCP and if we were to place him on warfarin, it may be more difficult to titrate this medication for him. With apixaban, the patient is Marcello Moores C, and apixaban is not recommended in use of such patients. Lovenox might be the best option for him, plus if he decides to pursue biopsy after all, this could be stopped more easily in anticipation of biopsy    Abdominal pain:  - Currently on tylenol 673m every 8 hours   - Continue gabapentin 3064mat night (given his symptoms are most pronounced at night)   - Continue Palliative Care recommendations     Malnutrition  - Nutrition consult appreciated    UC s/p total colectomy w/ Jpouch creation 2010  - continue home mesalamine and started Cipro and hydrocortisone as above    F: PO    E:daily labs, replete PRN  N:Regular with supplements    DVT ppWOE:HOZY Bowel reg:Not needed right now    PUD ppx: PPI BID    Dispo: pending clinical course     JaLaroy AppleMD  PGY-6, Gastroenterology and Hepatology Fellow  I saw and evaluated the patient. I agree with the resident's/fellow's findings and plan of care as documented above.    GOStasia CavalierMD

## 2019-12-29 NOTE — Progress Notes (Addendum)
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 Palliative care consulted for symptom management and goals of care.    Subjective:  - was supposed to be discharged but then had rectal bleeding from the pouch. RBC tag showed bleed. He is now NPO waiting for repeat pouchoscopy.  -says he is really dejected and has low morale  - back hurts from being in bed  - wants to see a physical therapist  - pain in abdomen is well controlled with dilaudid  - says he would like to be DNR/DNI     Palliative Care ROS:  ROS negative except as noted above.    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 He has not yet had liver bx for definitive dx due to bleeding and anemia. He is considering how many invasive procedures are within his goals of care. Decided DNR/DNI code status today    He is aware that he can stop at any point and we explained that we have an inpatient palliative care unit available to him.    Pain:  - Continue hydromorphone 2mg  1-2 tablets q 3h prn  -  Please order lorazepam 1mg  q4h prn for anxiety or prior to procedure that may cause discomfort  - please order PT  - lidocaine patches for his back    Bowel regimen:  - miralax, bisacodyl supp daily prn.      Follow up with palliative care clinic for pain/symptom meds       DNR DNI  HCP isJessica Brower, significant other  Estimated prognosis: 2-6 mos, most likely <3 months considering cholangiocarcinoma plus co-morbidities    Kyle Nordmann, MD 12/29/2019               _______________________________________________________________  Patient Active Problem List   Diagnosis Code    PSC (primary sclerosing cholangitis) K83.01    Primary sclerosing cholangitis K83.01       Allergies   Allergen Reactions    Adhesive Tape Rash    Sulfa Antibiotics Rash     Rash on legs        Scheduled Meds:    simethicone  80 mg Oral 4x Daily    [START ON 12/30/2019] mesalamine  4.8 g Oral Daily with breakfast    ciprofloxacin  500 mg Oral 2 times per day    hydrocortisone  25 mg Rectal Daily    pantoprazole  40 mg Intravenous Q12H    acetaminophen  650 mg Oral BID  Continuous Infusions:    sodium chloride      sodium chloride         PRN Meds:  bisacodyl, sodium chloride, dextrose, LORazepam, haloperidol lactate, HYDROmorphone **OR** HYDROmorphone, hydrOXYzine HCl, sodium chloride, dextrose

## 2019-12-29 NOTE — Progress Notes (Signed)
Pt restless in stretcher in AC4 GI recovery room. C/O pressure to abdomen rating it a 5/10. Dilaudid 0.5mg  given as per order at [facility]

## 2019-12-30 LAB — CBC AND DIFFERENTIAL
Baso # K/uL: 0 10*3/uL (ref 0.0–0.1)
Baso # K/uL: 0 10*3/uL (ref 0.0–0.1)
Baso # K/uL: 0 10*3/uL (ref 0.0–0.1)
Basophil %: 0.1 %
Basophil %: 0.1 %
Basophil %: 0.1 %
Eos # K/uL: 0.2 10*3/uL (ref 0.0–0.5)
Eos # K/uL: 0.1 10*3/uL (ref 0.0–0.5)
Eos # K/uL: 0.1 10*3/uL (ref 0.0–0.5)
Eosinophil %: 1.6 %
Eosinophil %: 1.2 %
Eosinophil %: 1.2 %
Hematocrit: 24 % — ABNORMAL LOW (ref 40–51)
Hematocrit: 23 % — ABNORMAL LOW (ref 40–51)
Hematocrit: 21 % — ABNORMAL LOW (ref 40–51)
Hemoglobin: 8.1 g/dL — ABNORMAL LOW (ref 13.7–17.5)
Hemoglobin: 7.8 g/dL — ABNORMAL LOW (ref 13.7–17.5)
Hemoglobin: 7.1 g/dL — ABNORMAL LOW (ref 13.7–17.5)
IMM Granulocytes #: 0.1 10*3/uL — ABNORMAL HIGH (ref 0.0–0.0)
IMM Granulocytes #: 0.1 10*3/uL — ABNORMAL HIGH (ref 0.0–0.0)
IMM Granulocytes #: 0 10*3/uL (ref 0.0–0.0)
IMM Granulocytes: 0.7 %
IMM Granulocytes: 0.6 %
IMM Granulocytes: 0.5 %
Lymph # K/uL: 0.7 10*3/uL — ABNORMAL LOW (ref 1.3–3.6)
Lymph # K/uL: 0.6 10*3/uL — ABNORMAL LOW (ref 1.3–3.6)
Lymph # K/uL: 0.6 10*3/uL — ABNORMAL LOW (ref 1.3–3.6)
Lymphocyte %: 7.6 %
Lymphocyte %: 6.6 %
Lymphocyte %: 6.8 %
MCH: 31 pg/cell (ref 26–32)
MCH: 31 pg/cell (ref 26–32)
MCH: 31 pg/cell (ref 26–32)
MCHC: 34 g/dL (ref 32–37)
MCHC: 34 g/dL (ref 32–37)
MCHC: 34 g/dL (ref 32–37)
MCV: 92 fL (ref 79–92)
MCV: 93 fL — ABNORMAL HIGH (ref 79–92)
MCV: 93 fL — ABNORMAL HIGH (ref 79–92)
Mono # K/uL: 1.2 10*3/uL — ABNORMAL HIGH (ref 0.3–0.8)
Mono # K/uL: 1.2 10*3/uL — ABNORMAL HIGH (ref 0.3–0.8)
Mono # K/uL: 1 10*3/uL — ABNORMAL HIGH (ref 0.3–0.8)
Monocyte %: 12.4 %
Monocyte %: 12.4 %
Monocyte %: 11.5 %
Neut # K/uL: 7.2 10*3/uL — ABNORMAL HIGH (ref 1.8–5.4)
Neut # K/uL: 7.3 10*3/uL — ABNORMAL HIGH (ref 1.8–5.4)
Neut # K/uL: 6.8 10*3/uL — ABNORMAL HIGH (ref 1.8–5.4)
Nucl RBC # K/uL: 0 10*3/uL (ref 0.0–0.0)
Nucl RBC # K/uL: 0 10*3/uL (ref 0.0–0.0)
Nucl RBC # K/uL: 0 10*3/uL (ref 0.0–0.0)
Nucl RBC %: 0 /100 WBC (ref 0.0–0.2)
Nucl RBC %: 0 /100 WBC (ref 0.0–0.2)
Nucl RBC %: 0 /100 WBC (ref 0.0–0.2)
Platelets: 314 10*3/uL (ref 150–330)
Platelets: 296 10*3/uL (ref 150–330)
Platelets: 266 10*3/uL (ref 150–330)
RBC: 2.6 MIL/uL — ABNORMAL LOW (ref 4.6–6.1)
RBC: 2.5 MIL/uL — ABNORMAL LOW (ref 4.6–6.1)
RBC: 2.3 MIL/uL — ABNORMAL LOW (ref 4.6–6.1)
RDW: 18.8 % — ABNORMAL HIGH (ref 11.6–14.4)
RDW: 19 % — ABNORMAL HIGH (ref 11.6–14.4)
RDW: 19.1 % — ABNORMAL HIGH (ref 11.6–14.4)
Seg Neut %: 77.6 %
Seg Neut %: 79.1 %
Seg Neut %: 79.9 %
WBC: 9.3 10*3/uL — ABNORMAL HIGH (ref 4.2–9.1)
WBC: 9.3 10*3/uL — ABNORMAL HIGH (ref 4.2–9.1)
WBC: 8.5 10*3/uL (ref 4.2–9.1)

## 2019-12-30 LAB — RED BLOOD CELLS
Coded Blood type: 5100
Component blood type: O POS
Dispense status: TRANSFUSED

## 2019-12-30 LAB — PHOSPHORUS: Phosphorus: 2.6 mg/dL — ABNORMAL LOW (ref 2.7–4.5)

## 2019-12-30 LAB — COMPREHENSIVE METABOLIC PANEL
ALT: 120 U/L — ABNORMAL HIGH (ref 0–50)
AST: 236 U/L — ABNORMAL HIGH (ref 0–50)
Albumin: 2.5 g/dL — ABNORMAL LOW (ref 3.5–5.2)
Alk Phos: 903 U/L — ABNORMAL HIGH (ref 40–130)
Anion Gap: 11 (ref 7–16)
Bilirubin,Total: 23.9 mg/dL — ABNORMAL HIGH (ref 0.0–1.2)
CO2: 25 mmol/L (ref 20–28)
Calcium: 8.3 mg/dL — ABNORMAL LOW (ref 9.0–10.3)
Chloride: 103 mmol/L (ref 96–108)
Creatinine: 0.65 mg/dL — ABNORMAL LOW (ref 0.67–1.17)
GFR,Black: 138 *
GFR,Caucasian: 119 *
Glucose: 96 mg/dL (ref 60–99)
Lab: 13 mg/dL (ref 6–20)
Potassium: 3.8 mmol/L (ref 3.3–5.1)
Sodium: 139 mmol/L (ref 133–145)
Total Protein: 4.9 g/dL — ABNORMAL LOW (ref 6.3–7.7)

## 2019-12-30 LAB — PROTIME-INR
INR: 1.2 — ABNORMAL HIGH (ref 0.9–1.1)
Protime: 13.3 s — ABNORMAL HIGH (ref 10.0–12.9)

## 2019-12-30 LAB — MAGNESIUM: Magnesium: 2.3 mg/dL (ref 1.6–2.5)

## 2019-12-30 LAB — TYPE AND SCREEN
ABO RH Blood Type: O POS
Antibody Screen: NEGATIVE

## 2019-12-30 MED ORDER — HYDROMORPHONE HCL 4 MG PO TABS *I*
6.0000 mg | ORAL_TABLET | ORAL | Status: DC | PRN
Start: 2019-12-30 — End: 2020-01-01

## 2019-12-30 MED ORDER — HYDROCORTISONE ACE-PRAMOXINE 1-1 % RE FOAM *I*
1.0000 | Freq: Two times a day (BID) | CUTANEOUS | Status: DC
Start: 2019-12-30 — End: 2020-01-05
  Administered 2019-12-30 – 2020-01-04 (×11): 1 via RECTAL
  Filled 2019-12-30 (×4): qty 10

## 2019-12-30 MED ORDER — HYDROMORPHONE HCL 2 MG PO TABS *I*
2.0000 mg | ORAL_TABLET | ORAL | Status: DC | PRN
Start: 2019-12-30 — End: 2020-01-01
  Administered 2019-12-31 – 2020-01-01 (×3): 2 mg via ORAL
  Filled 2019-12-30 (×3): qty 1

## 2019-12-30 MED ORDER — HYDROMORPHONE HCL 4 MG PO TABS *I*
4.0000 mg | ORAL_TABLET | ORAL | Status: DC | PRN
Start: 2019-12-30 — End: 2020-01-01
  Administered 2019-12-30 – 2020-01-01 (×4): 4 mg via ORAL
  Filled 2019-12-30 (×4): qty 1

## 2019-12-30 NOTE — Progress Notes (Signed)
Physical Therapy Initial Evaluation:   Kyle Bright demonstrates adequate mobility with bed mobility, transfers and ambulation to return to his prior living arrangement using no device.     Will follow for ongoing PT 1-2x/week for strengthening and home exercise program instruction.    Discharge Recommendations: Anticipate return to prior living arrangement, Outpatient PT after discharge from the hospital.     PT Discharge Equipment Recommended: None    History of Present Admission: 44 yo M with history of end stage liver disease 2/2 PSC, ulcerative colitis s/p total colectomy w/ J pouch creation in 2010, recent ERCP with CBD stent 12/04/19 at OSH in [place] who was transferred from there 1/20 for further management with course not complicated by suspected a right hepatic lobe mass suspicious for malignancy with plan for biopsy today.    Past Medical History:   Diagnosis Date    Anemia     Liver disease     Osteoporosis     Palpitations     Pouchitis     PSC (primary sclerosing cholangitis)     PSC (primary sclerosing cholangitis)     Ulcerative colitis         Past Surgical History:   Procedure Laterality Date    APPENDECTOMY      COLONOSCOPY      TOTAL COLECTOMY         Personal factors affecting treatment/recovery:   None identified    Comorbidities affecting treatment/recovery:   Osteoporosis    Clinical presentation:   stable    Patient complexity:     low level as indicated by above stability of condition, personal factors, environmental factors and comorbidities in addition to their impairments found on physical exam.       12/30/19 1200   Prior Living    Prior Living Situation Reported by patient   Lives With Significant other   Type of Home Apartment   # Steps to [place] in Home None   Prior Function Level   Prior Function Level Reported by patient   Additional Comments Pt reports that he was independent with all mobility prior to admission and denies falls.    PT  Tracking   PT TRACKING PT Assigned   Visit Number   Visit Number [facility]) / Treatment Day [facility]) 1   Visit Details [facility])   Visit Type [facility]) Evaluation   Precautions/Observations   Precautions used Yes   Was patient wearing a mask? Yes   PPE worn by Probation officer Gloves;Goggles;Mask   Fall Precautions General falls precautions   Pain Assessment   *Is the patient currently in pain? Yes   Pain (Before,During, After) Therapy During   0-10 Scale   (does not rate during session)   Pain Location Back   Pain Orientation Lower   Pain Descriptors Aching   Pain Intervention(s) Refer to nursing for pain management   Additional comments Pt is TTP in paraspinal bilaterally. Pt reports pain withlaying supine.   Cognition   Cognition Tested   Arousal/Alertness Appropriate responses to stimuli   Orientation A&Ox4   Following Commands Follows all commands and directions without difficulty   LUE Strength   Additional comments 4+/5 grossly   RUE Strength   Additional comments 4+/5 grossly   Strength LLE   Additional Comments 4+/5 grossly   Strength RLE   Additional Comments 4+/5 grossly   Sensation   Sensation No apparent deficit   Bed Mobility   Bed mobility Tested  Supine to Sit Independent   Sit to Supine Independent   Additional comments pt performs with [facility] flat   Transfers   Transfers Tested   Sit to Stand Independent   Stand to sit Independent   Transfer Assistive Device none   Additional comments Pt is able to perform easily for EOB.    Mobility   Mobility: Gait/Stairs Tested   Gait Pattern Decreased cadence   Ambulation Assist Independent   Ambulation Distance (Feet) 160'   Ambulation Assistive Device None   Additional comments Pt is able to amb around unit with steady gait. Pt without overt LOb but slow gait.    Therapeutic Exercises   Additional comments Pt given HEP including paraspinal stretching, hamstring stretching, ambdominal bracing, nd marhcing at EOB with abdomina,l bracing.    Balance   Balance Tested   Sitting - Static  Independent    Sitting - Dynamic Independent   Standing - Static Independent   Standing - Dynamic Independent   PT AM-PAC Mobility   Turning over in bed? 4   Sitting down on and standing up from a chair with arms? 4   Moving from lying on back to sitting on the side of the bed? 4   Moving to and from a bed to a chair? 4   Need to walk in hospital room? 4   Climbing 3 - 5 steps with a railing? 1   Total Raw Score 21   Assessment   Brief Assessment Appropriate for skilled therapy   Problem List   (impaired core strength.)   Patient / Family Goal to improve back pain.    Plan/Recommendation   Treatment Interventions Restorative PT   PT Frequency 1-2 x/wk   Mobility Recommendations amb ad lib on unit   Discharge Recommendations Anticipate return to prior living arrangement;Outpatient PT   PT Discharge Equipment Recommended None   Assessment/Recommendations Reviewed With: Patient;Nursing   Next PT Visit progress HEP   Time Calculation   Total Time Therapeutic Activities (minutes) 0   Total Time Gait Training (minutes) 0   Total Time Therapeutic Exercises (minutes) 10   Total Time Neuromuscular Re-education (minutes) 0   Total Time Group Therapy (minutes) 0   PT Timed Codes 10   PT Untimed Codes 15   PT Unbilled Time 0   PT Total Treatment 25   PT Total Treatment 15   Plan and Onset date   Plan of Care Date 12/30/19   Onset Date 12/20/19   Treatment Start Date 12/30/19   Waldon Merl, DPT  [phone]

## 2019-12-30 NOTE — Plan of Care (Signed)
Problem: Other Problem  Goal: LTG-Other  Note: Pt will be independent with HEP in 12 days

## 2019-12-30 NOTE — Plan of Care (Signed)
HMD X-Cover Note. CTSP for: abdominal pain    S: Patient had reported "stabby/Pokey" pain to LUQ to nursing- this was first time he has complained of this pain to nursing but he reports to me he has had it previously in the past in different locations and was "just updating nursing staff on symptoms." His overall abdominal pain is improved with pain medication so he thinks maybe he is just noticing it now/again because the other pain is improved. At the time of his pain his stomach was gurgling a lot and as the gurgling passed through his symptoms improved. He has had three bowel movements since eating (mostly clears though on a regular diet), the first two were brown and the third was moderately bloody. Nursing confirms it was bright red blood, moderate amount.     He apologized if he sound irritated and reports he is frustrated that they thought they fixed the bleed and he started to rebleed again--and that this is the second time this has happened. He also reports he was frustrated nursing called me to see the patient as he is tired and just wants to sleep. He reports it was not an alarming/new pain though he understands nursing did not know this. Writer acknowledged and supported his frustrations.    O:  General: lying in bed, NAD, A&Ox3, appears chronically ill, cachetic, notable bony prominences,    Skin: with jaundice   Lungs: CTA, normal WOB, talking in full sentences, non-tachypneic  Heart: RRR no murmur, non-tachycardic  Abd: soft, thin, non-distended, +BS, generalized TTP Last BM: today  Ext: warm, dry      Vitals:    12/29/19 1700 12/29/19 1744 12/29/19 2000 12/30/19 0100   BP: 94/66 104/65 103/68 99/58   BP Location:  Right arm Right arm Right arm   Pulse:  86 98 87   Resp: 16 16 18 16    Temp:  36.9 C (98.4 F) 36.9 C (98.4 F) 36.9 C (98.4 F)   TempSrc:  Oral Oral Oral   SpO2: 98% 100% 99% 99%   Weight:       Height:           A/P: 44 yo M with PMHx [place] and UC s/p total colectomy w/ J pouch  creation in 2010 who was transferred from OSH for further management of obstructive jaundice/worsening liver disease, GIB. Seen overnight for bloody stool and abdominal pain    GIB s/p Pouchoscopy  -patient with bloody bowel movement  -unclear if this would be expected after pouchoscopy; per procedure "was no old or fresh blood proximal to the pouch. Source of bleed is likely pouch."  -H/H stable  -d/w Dr. Kalman Drape; continue to monitor for now    Abdominal pain "stabby/Pokey"  -though a newly reported type of abdominal pain; not new to patient  -does not appear to be an alarming pain  -exam reassuring/consistent with day team  -continue pain regimen per day team    Nursing updated with plan of care.     Vita Erm, NP

## 2019-12-30 NOTE — Progress Notes (Signed)
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    Subjective:  He has had a small degree of rectal bleeding following cautery of stop bleeding from his pouch.  No change in hematocrit.  He saw physical therapy which offered exercises to help him with back pain related to prolonged bedrest.  We were able to arrange for his partner, Kyle Bright, to be with him in the hospital and she is here today.  Pain control is reasonably good, but he feels he needs 4 mg of hydromorphone for moderate pain and possibly a higher dose for severe pain.   He is in the process of preparing a will, living will, power of attorney.  MOLST and healthcare proxy have been completed.  He is interested in hospice evaluation.    Palliative Care ROS:  ROS negative except as noted above.    Pain Medications in past 24 hours:  4mg  oral dilaudid x1 plus 2mg  x1  = 6mg  total    Physical Examination:  BP: (78-122)/(48-87)   Temp: [36.8 C (98.2 F)-37.6 C (99.7 F)]   Temp src: Oral (01/27 1634)  Heart Rate: [85-96]   Resp: [15-18]   SpO2: [96 %-100 %]     Gen: No acute distress, appears comfortablebut tired, alert, oriented,   Eyes/skin:jaundiced    Assessment/Plan:   Kyle Bright is a 44 year old man with a clinical picture consistent with advanced cholangiocarcinoma. He is moving towards a purely comfort oriented plan of care.  Unfortunately, he has had episodes of rectal bleeding the past several days requiring procedures, which have delayed his discharge.  Pain is reasonably well controlled but he would like to have access to higher doses for moderate  pain.    Recommend:  - Change hydromorphone 2mg  tablets to   1 tablet (20mf) prn mild pain  2 tablets (4mg ) prn moderate pain  3 tablets (6mg ) prn severe pain   All q 2hrs  -- Please arrange for hospice consult prior to discharge, or if not possible, soon after discharge    - Continue lorazepam 1mg  q4h prn anxiety or prior to procedure   -- continue physical therapy post-discharge  -  Continue lidocaine patches for his back  - miralax, bisacodyl supp daily prn.    Follow up with palliative care clinic for pain/symptom meds     DNR DNI  HCP isJessica Bright, significant other  Estimated prognosis: 2-6 mos, most likely <3 months considering cholangiocarcinoma plus co-morbidities     Total time 35 min, more than 1/2 counseling and coordination of care.     Kyle Shone, MD

## 2019-12-30 NOTE — Progress Notes (Signed)
SW Progress Note    SW received page pt interested in speaking to someone about completing advanced directives ([place]). Sign out to be provided to unit SW to follow up on Monday. No notaries available to complete POA on weekends and Living Wills's are unable to be completed w/ SW.     Court Joy, LMSW  Weekend IP SW  [place]: [phone]

## 2019-12-30 NOTE — Plan of Care (Signed)
Problem: Bowel Elimination  Goal: Elimination patterns are normal or improving  Outcome: Progressing towards goal     Problem: GI Bleeding Elimination  Goal: Elimination of patterns are normal or improving  Outcome: Progressing towards goal     Problem: Safety  Goal: Patient will remain free of falls  Outcome: Maintaining  Goal: Prevent any intentional injury  Outcome: Maintaining     Problem: Pain/Comfort  Goal: Patient's pain or discomfort is manageable  Outcome: Maintaining     Problem: Nutrition  Goal: Patient's nutritional status is maintained or improved  Outcome: Maintaining     Problem: Mobility  Goal: Patient's functional status is maintained or improved  Outcome: Maintaining     Problem: Psychosocial  Goal: Demonstrates ability to cope with illness  Outcome: Maintaining     Problem: Cognitive function  Goal: Cognitive function will be maintained or return to baseline  Outcome: Maintaining

## 2019-12-30 NOTE — Progress Notes (Signed)
Transplant Hepatology Follow up    Admit Date:  12/20/2019    Interval Events:   HCT this am 44 (26) without transfusion     Pouchoscopy yesterday showing- One small actively oozing ulcer in the pouch, treated with epinephrine and bicap cautery.    Friable mucosa s/p APC of erythematous, oozing areas in the pouch  No old or fresh blood beyond the pouch. Brown stool further proximal. No ulcers or erosions proximal to the pouch.     Reporting bloody BM at 2 am     Subjective:   Continues to want to go home.  Feels eating makes the bleeding worse.       Medications:    Scheduled Meds:   simethicone  80 mg Oral 4x Daily    mesalamine  4.8 g Oral Daily with breakfast    lidocaine  1 patch Transdermal Q24H    hydrocortisone-pramoxine  1 applicator Rectal 2 times per day    ciprofloxacin  500 mg Oral 2 times per day    pantoprazole  40 mg Intravenous Q12H    acetaminophen  650 mg Oral BID     Continuous Infusions:   electrolyte-148 Stopped (12/29/19 1548)    sodium chloride      sodium chloride       PRN Meds:.sodium chloride, dextrose, bisacodyl, sodium chloride, dextrose, LORazepam, haloperidol lactate, HYDROmorphone **OR** HYDROmorphone, hydrOXYzine HCl, sodium chloride, dextrose    Physical examination:      BP 103/64 (BP Location: Right arm)    Pulse 92    Temp 37.3 C (99.1 F) (Oral)    Resp 16    Ht 1.753 m (5' 9" )    Wt 52.6 kg (116 lb)    SpO2 99%    BMI 17.13 kg/m   General appearance: awake and alert, jaundiced, thin, sitting cross legged in bed having breakfast  Cardiovascular: regular rate and rhythm, S1, S2 normal  Respiratory CTAB. No crackles or wheezes  Gastrointestinal: Normoactive bowel sounds. Abdomen soft, tender and very mildly distended   Skin: jaundice with excoriations   Neurological: grossly normal    LAB DATA:         Lab results: 12/30/19  0137 12/29/19  1801 12/29/19  0440 12/28/19  2208 12/28/19  1218   WBC 9.3* 9.3* 9.3* 8.2 8.2   Hemoglobin 8.1* 8.7* 8.4* 6.9* 7.9*   Hematocrit  24* 26* 25* 20* 24*   RBC 2.6* 2.8* 2.8* 2.2* 2.5*   Platelets 314 318 313 252 271           Lab results: 12/30/19  0137 12/29/19  0440 12/28/19  0536 12/27/19  0013 12/26/19  0102   Sodium 139 136 134 138 137   Potassium 3.8 3.6 3.5 3.7 3.8   Chloride 103 101 99 103 101   CO2 25 26 25 24 24    UN 13 8 9 14 12    Creatinine 0.65* 0.64* 0.63* 0.68 0.69   GFR,Caucasian 119 120 121 117 116   GFR,Black 138 138 139 135 134   Glucose 96 109* 114* 120* 118*   Calcium 8.3* 8.5* 8.6* 8.4* 9.2   Total Protein 4.9* 4.8* 5.0* 4.9* 5.6*   Albumin 2.5* 2.6* 2.7* 2.6* 3.0*   ALT 120* 105* 98* 87* 96*   AST 236* 208* 185* 141* 151*   Alk Phos 903* 792* 721* 595* 650*   Bilirubin,Total 23.9* 24.2* 24.5* 22.6* 25.4*           Lab results:  12/30/19  0137   INR 1.2*     MELD-Na score: 20 at 12/30/2019  1:37 AM  MELD score: 20 at 12/30/2019  1:37 AM  Calculated from:  Serum Creatinine: 0.65 mg/dL (Rounded to 1 mg/dL) at 12/30/2019  1:37 AM  Serum Sodium: 139 mmol/L (Rounded to 137 mmol/L) at 12/30/2019  1:37 AM  Total Bilirubin: 23.9 mg/dL at 12/30/2019  1:37 AM  INR(ratio): 1.2 at 12/30/2019  1:37 AM  Age: 44 years 2 months      IMAGING:   Nm Gi Bleed Imaging Tagged Rbc    Result Date: 12/28/2019  Findings consistent with active GI bleed likely from small bowel. Findings were discussed with JACQUELINE P CULLEN via phone on 12/28/2019 5:11 PM. END OF IMPRESSION    Ct Angio Abdomen And Pelvis    Result Date: 12/28/2019  1, No acute GI bleed is visualized on CT exam. 2. No significant change in ill-defined heterogeneous liver lesions with overlying capsular retraction. This is concerning for cholangiocarcinoma with abscess and metastasis considered less likely. 3. Sequela of known biliary PSC. 4. Soft tissue thickening within the upper retroperitoneum and throughout the mesentery, likely confluent adenopathy. 5. Unchanged mild splenomegaly. 6. Interval increase in gallbladder distention. 7. Slight interval decrease in the extent of the small bowel  wall dilatation, which may be due to an evolving ileus, likely within a small bowel obstruction. This can be followed on further imaging, including plain films.2 8. Sequela of fluid overload including small bilateral pleural effusions, body wall anasarca, and intra-abdominal ascites. END OF IMPRESSION I have personally reviewed the images and the Resident's/Fellow's interpretation and agree with or edited the findings. [facility] submits this DICOM format image data and final report to the [facility], an independent secure electronic health information exchange, on a reciprocally searchable basis (with patient authorization) for a minimum of 12 months after exam date.    ASSESSMENT/RECOMMENDATIONS:   44 yo M with PMHx [place] and UC s/p total colectomy w/ J pouch creation in 2010 who was transferred from OSH for further management of obstructive jaundice/worsening liver disease. Presented at OSH with complaints of increasing abd pain, jaundice, and darkened urine. His OSH course was notable for removal of previously placed CBD stent (12/04/19 as an outpatient) and was c/b new onset DVT/PE and he was started on a heparin drip. Hospital course here notable for GI bleed and concerning ill-defined heterogenous attenuation of right hepatic lobe on CT scan. MELD 22, blood group O.     PSC now with new ill-defined heterogenous attenuation of right hepatic lobe-   - We had discussed biopsy and we ultimately decided with the patient not to pursue biopsy right now.  Please see previous notes regarding discussions  - Patient was listed for transplant but now is on internal hold given concerns for infiltrative cholangiocarcinoma  - Appreciate Medical Oncology's involvement. Likely with limited chemotherapy options given elevated bilirubin. Discussed imaging with advanced endoscopist, and limited utility in ERCP for stenting. We will arrange for outpatient follow-up and liver Bx.   - Appreciate Palliative Care involvement  and patient is comfort-oriented and now DNR/DNI. We will arrange for outpatient follow-up     GI bleed:  - Continues to bleed with hydrocortisone suppositories and Cipro. We will changed suppository to foam and repeated pouchoscopy 1/29.  Found one small actively oozing ulcer in the pouch, treated with epinephrine and bicap cautery.   - CT angio without bleed, Tagged RBC with blush at left flank -  small bowel bleed. Please see the subjective regarding discussions we had about patency, then VCE and where to go from there  - Holding anticoagulation in setting of bleed  - Transfuse for hemoglobin < 7 or hematocrit < 21  - Continue PPI BID    Mild Ileus: resolved  - We stopped senna, Miralax and colace. Patient is not clinically with ileus or obstructed though imaging is suggestive    Concern for cholangitis:  - From notes, patient was at [facility] for ERCP on January 4th, 2021. He had a stent placed, and based on telephone notes, then became jaundiced 12/12/2019 at which point he went back to [place]. There, he had biliary stent removed. His most recent MR/MR[facility] at [facility] on 12/22/2019 shows intrahepatic duct beading, without main duct narrowing. Reviewed the imaging with advanced endoscopist, and the utility of stent in addressing hyperbilirubinemia is low. Will not plan for repeat ERCP here  - We stopped empiric Zosyn (was started at admission) because blood cultures are NGTD     DVT/PE-   - Patient had bloody BMs, and heparin drip held  - We previously had discussed with patient the short and long term risk/benefits for anticoagulation. We decided together that we'll wait for bleed to resolve, and as an outpatient will discuss starting anticoagulation or not. Depending on our discussions with the patient, he may lean towards comfort measures only and no anticoagulation as outpatient. Otherwise, as noted before, he has no PCP and if we were to place him on warfarin, it may be more difficult to titrate this medication for  him. With apixaban, the patient is Marcello Moores C, and apixaban is not recommended in use of such patients. Lovenox might be the best option for him, plus if he decides to pursue biopsy after all, this could be stopped more easily in anticipation of biopsy    Abdominal pain:  - Currently on tylenol 659m every 8 hours   - Continue gabapentin 3037mat night (given his symptoms are most pronounced at night)   - Continue Palliative Care recommendations     Malnutrition  - Nutrition consult appreciated    UC s/p total colectomy w/ Jpouch creation 2010  - continue home mesalamine and started Cipro and hydrocortisone as above    F: PO    E:daily labs, replete PRN  N:Regular with supplements    DVT ppEVO:JJKK Bowel reg:Not needed right now    PUD ppx: PPI BID    Dispo: pending clinical course     JaDannielle HuhNP    I saw, examined and evaluated the patient, reviewed the patient chart and the relevant images and lab findings. On my exam I find....  S: No significant new complaints.  O: Alert and Oriented x3        Vital signs reviewed and stable    Icteric sclera   Chest:Goodair entry bilaterally    CVS: S1+ and S2+, RRR   Abdomen:  BS+, NT. No free fluid   Extremities: No pitting edema in both lower extremities   CNS: Non focal no asterixis    Plan of care to...  PS[place]ith possible cholangio carcinoma and very his CA19-9  Lower GI bleed, mostly coming from the pouch. Had some endoscopic therapy yesterday  No hemo dynamic instability and no transfusion requirement  Continue rectal steroid foam in to the pouch  Patient is DNR/DNI  GOStasia CavalierMD

## 2019-12-31 LAB — CBC AND DIFFERENTIAL
Baso # K/uL: 0 10*3/uL (ref 0.0–0.1)
Baso # K/uL: 0 10*3/uL (ref 0.0–0.1)
Basophil %: 0.1 %
Basophil %: 0.1 %
Eos # K/uL: 0.1 10*3/uL (ref 0.0–0.5)
Eos # K/uL: 0.2 10*3/uL (ref 0.0–0.5)
Eosinophil %: 1.3 %
Eosinophil %: 1.6 %
Hematocrit: 23 % — ABNORMAL LOW (ref 40–51)
Hematocrit: 24 % — ABNORMAL LOW (ref 40–51)
Hemoglobin: 7.4 g/dL — ABNORMAL LOW (ref 13.7–17.5)
Hemoglobin: 7.7 g/dL — ABNORMAL LOW (ref 13.7–17.5)
IMM Granulocytes #: 0.1 10*3/uL — ABNORMAL HIGH (ref 0.0–0.0)
IMM Granulocytes #: 0.1 10*3/uL — ABNORMAL HIGH (ref 0.0–0.0)
IMM Granulocytes: 0.5 %
IMM Granulocytes: 0.9 %
Lymph # K/uL: 0.6 10*3/uL — ABNORMAL LOW (ref 1.3–3.6)
Lymph # K/uL: 0.9 10*3/uL — ABNORMAL LOW (ref 1.3–3.6)
Lymphocyte %: 6.3 %
Lymphocyte %: 8.8 %
MCH: 31 pg/cell (ref 26–32)
MCH: 31 pg/cell (ref 26–32)
MCHC: 32 g/dL (ref 32–37)
MCHC: 33 g/dL (ref 32–37)
MCV: 95 fL — ABNORMAL HIGH (ref 79–92)
MCV: 95 fL — ABNORMAL HIGH (ref 79–92)
Mono # K/uL: 1.1 10*3/uL — ABNORMAL HIGH (ref 0.3–0.8)
Mono # K/uL: 1.3 10*3/uL — ABNORMAL HIGH (ref 0.3–0.8)
Monocyte %: 11.8 %
Monocyte %: 13 %
Neut # K/uL: 7.4 10*3/uL — ABNORMAL HIGH (ref 1.8–5.4)
Neut # K/uL: 7.6 10*3/uL — ABNORMAL HIGH (ref 1.8–5.4)
Nucl RBC # K/uL: 0 10*3/uL (ref 0.0–0.0)
Nucl RBC # K/uL: 0 10*3/uL (ref 0.0–0.0)
Nucl RBC %: 0 /100 WBC (ref 0.0–0.2)
Nucl RBC %: 0 /100 WBC (ref 0.0–0.2)
Platelets: 275 10*3/uL (ref 150–330)
Platelets: 297 10*3/uL (ref 150–330)
RBC: 2.4 MIL/uL — ABNORMAL LOW (ref 4.6–6.1)
RBC: 2.5 MIL/uL — ABNORMAL LOW (ref 4.6–6.1)
RDW: 19 % — ABNORMAL HIGH (ref 11.6–14.4)
RDW: 19.1 % — ABNORMAL HIGH (ref 11.6–14.4)
Seg Neut %: 76 %
Seg Neut %: 79.6 %
WBC: 9.6 10*3/uL — ABNORMAL HIGH (ref 4.2–9.1)
WBC: 9.8 10*3/uL — ABNORMAL HIGH (ref 4.2–9.1)

## 2019-12-31 LAB — PLATELET MAPPING
A Angle: 75 deg — ABNORMAL HIGH (ref 53.0–73.0)
Coagulation Index: 3.3 — ABNORMAL HIGH (ref <=3.0)
K Time: 1 min (ref 1.0–3.0)
LY30: 1.1 % (ref 0.0–8.0)
Maximum Amplitude: 71.5 mm (ref 50.0–72.0)
Platelet Mapping ADP: 62.9 mm
Platelet Mapping Arachadonic Acid: 67.4 mm
R Time: 4.6 min (ref 4.0–10.0)

## 2019-12-31 LAB — BLOOD BANK HOLD RED

## 2019-12-31 LAB — COMPREHENSIVE METABOLIC PANEL
ALT: 137 U/L — ABNORMAL HIGH (ref 0–50)
AST: 288 U/L — ABNORMAL HIGH (ref 0–50)
Albumin: 2.5 g/dL — ABNORMAL LOW (ref 3.5–5.2)
Alk Phos: 1031 U/L — ABNORMAL HIGH (ref 40–130)
Anion Gap: 11 (ref 7–16)
Bilirubin,Total: 23.6 mg/dL — ABNORMAL HIGH (ref 0.0–1.2)
CO2: 23 mmol/L (ref 20–28)
Calcium: 8.3 mg/dL — ABNORMAL LOW (ref 9.0–10.3)
Chloride: 101 mmol/L (ref 96–108)
Creatinine: 0.66 mg/dL — ABNORMAL LOW (ref 0.67–1.17)
GFR,Black: 137 *
GFR,Caucasian: 118 *
Glucose: 100 mg/dL — ABNORMAL HIGH (ref 60–99)
Lab: 12 mg/dL (ref 6–20)
Potassium: 3.7 mmol/L (ref 3.3–5.1)
Sodium: 135 mmol/L (ref 133–145)
Total Protein: 4.9 g/dL — ABNORMAL LOW (ref 6.3–7.7)

## 2019-12-31 LAB — BLOOD BANK HOLD LAVENDER

## 2019-12-31 LAB — MAGNESIUM: Magnesium: 2.2 mg/dL (ref 1.6–2.5)

## 2019-12-31 LAB — PROTIME-INR
INR: 1.2 — ABNORMAL HIGH (ref 0.9–1.1)
Protime: 13.1 s — ABNORMAL HIGH (ref 10.0–12.9)

## 2019-12-31 LAB — PHOSPHORUS: Phosphorus: 2.4 mg/dL — ABNORMAL LOW (ref 2.7–4.5)

## 2019-12-31 NOTE — Progress Notes (Signed)
Around 1230 pt c/o left calf pain. LLE negative for homan's sign, no swelling, or redness noted. Provider Traci Sermon, NP notified. Will continue to monitor for any changes.

## 2019-12-31 NOTE — Progress Notes (Signed)
Transplant Hepatology Follow up    Admit Date:  12/20/2019    Interval Events:     HCT stable today without transfusion had one bloody BM yesterday afternoon.    Partner [name] was here yesterday approved by Palliative care for visit.   Subjective:   Discouraged he is still having bloody BM        Medications:    Scheduled Meds:   hydrocortisone-pramoxine  1 applicator Rectal 2 times per day    simethicone  80 mg Oral 4x Daily    mesalamine  4.8 g Oral Daily with breakfast    lidocaine  1 patch Transdermal Q24H    ciprofloxacin  500 mg Oral 2 times per day    pantoprazole  40 mg Intravenous Q12H    acetaminophen  650 mg Oral BID     Continuous Infusions:   sodium chloride       PRN Meds:.HYDROmorphone **OR** HYDROmorphone, HYDROmorphone, sodium chloride, dextrose, bisacodyl, sodium chloride, dextrose, LORazepam, haloperidol lactate, hydrOXYzine HCl, sodium chloride, dextrose    Physical examination:      BP 106/76    Pulse 89    Temp 37.8 C (100 F) (Oral)    Resp 16    Ht 1.753 m (5' 9" )    Wt 52.6 kg (116 lb)    SpO2 100%    BMI 17.13 kg/m   General appearance: awake and alert, jaundiced, thin  Cardiovascular: regular rate and rhythm, S1, S2 normal  Respiratory CTAB. No crackles or wheezes  Gastrointestinal: Normoactive bowel sounds. Abdomen soft, tender and very mildly distended   Skin: jaundice with excoriations   Neurological: grossly normal    LAB DATA:         Lab results: 12/31/19  0011 12/30/19  1627 12/30/19  1117 12/30/19  0137 12/29/19  1801   WBC 9.8* 8.5 9.3* 9.3* 9.3*   Hemoglobin 7.4* 7.1* 7.8* 8.1* 8.7*   Hematocrit 23* 21* 23* 24* 26*   RBC 2.4* 2.3* 2.5* 2.6* 2.8*   Platelets 297 266 296 314 318           Lab results: 12/31/19  0011 12/30/19  0137 12/29/19  0440 12/28/19  0536 12/27/19  0013   Sodium 135 139 136 134 138   Potassium 3.7 3.8 3.6 3.5 3.7   Chloride 101 103 101 99 103   CO2 23 25 26 25 24    UN 12 13 8 9 14    Creatinine 0.66* 0.65* 0.64* 0.63* 0.68   GFR,Caucasian 118 119 120  121 117   GFR,Black 137 138 138 139 135   Glucose 100* 96 109* 114* 120*   Calcium 8.3* 8.3* 8.5* 8.6* 8.4*   Total Protein 4.9* 4.9* 4.8* 5.0* 4.9*   Albumin 2.5* 2.5* 2.6* 2.7* 2.6*   ALT 137* 120* 105* 98* 87*   AST 288* 236* 208* 185* 141*   Alk Phos 1,031* 903* 792* 721* 595*   Bilirubin,Total 23.6* 23.9* 24.2* 24.5* 22.6*           Lab results: 12/31/19  0011   INR 1.2*     MELD-Na score: 21 at 12/31/2019 12:11 AM  MELD score: 20 at 12/31/2019 12:11 AM  Calculated from:  Serum Creatinine: 0.66 mg/dL (Rounded to 1 mg/dL) at 12/31/2019 12:11 AM  Serum Sodium: 135 mmol/L at 12/31/2019 12:11 AM  Total Bilirubin: 23.6 mg/dL at 12/31/2019 12:11 AM  INR(ratio): 1.2 at 12/31/2019 12:11 AM  Age: 44 years 2 months  IMAGING:   Nm Gi Bleed Imaging Tagged Rbc    Result Date: 12/28/2019  Findings consistent with active GI bleed likely from small bowel. Findings were discussed with JACQUELINE P CULLEN via phone on 12/28/2019 5:11 PM. END OF IMPRESSION    Ct Angio Abdomen And Pelvis    Result Date: 12/28/2019  1, No acute GI bleed is visualized on CT exam. 2. No significant change in ill-defined heterogeneous liver lesions with overlying capsular retraction. This is concerning for cholangiocarcinoma with abscess and metastasis considered less likely. 3. Sequela of known biliary PSC. 4. Soft tissue thickening within the upper retroperitoneum and throughout the mesentery, likely confluent adenopathy. 5. Unchanged mild splenomegaly. 6. Interval increase in gallbladder distention. 7. Slight interval decrease in the extent of the small bowel wall dilatation, which may be due to an evolving ileus, likely within a small bowel obstruction. This can be followed on further imaging, including plain films.2 8. Sequela of fluid overload including small bilateral pleural effusions, body wall anasarca, and intra-abdominal ascites. END OF IMPRESSION I have personally reviewed the images and the Resident's/Fellow's interpretation and agree with or  edited the findings. [facility] submits this DICOM format image data and final report to the [facility], an independent secure electronic health information exchange, on a reciprocally searchable basis (with patient authorization) for a minimum of 12 months after exam date.    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 Presented at OSH with complaints of increasing abd pain, jaundice, and darkened urine. His OSH course was notable for removal of previously placed CBD stent (12/04/19 as an outpatient) and was c/b new onset DVT/PE and he was started on a heparin drip. Hospital course here notable for GI bleed and concerning ill-defined heterogenous attenuation of right hepatic lobe on CT scan. MELD 22, blood group O.     PSC now with new ill-defined heterogenous attenuation of right hepatic lobe-   MELD-Na score: 21 at 12/31/2019 12:11 AM  MELD score: 20 at 12/31/2019 12:11 AM  Calculated from:  Serum Creatinine: 0.66 mg/dL (Rounded to 1 mg/dL) at 12/31/2019 12:11 AM  Serum Sodium: 135 mmol/L at 12/31/2019 12:11 AM  Total Bilirubin: 23.6 mg/dL at 12/31/2019 12:11 AM  INR(ratio): 1.2 at 12/31/2019 12:11 AM  Age: 5 years 2 months  - Patient was listed for transplant but now is on internal hold given concerns for infiltrative cholangiocarcinoma  - Appreciate Medical Oncology's involvement. Likely with limited chemotherapy options given elevated bilirubin. Discussed imaging with advanced endoscopist, and limited utility in ERCP for stenting. We will arrange for outpatient follow-up and liver Bx.   - Appreciate Palliative Care involvement and patient is comfort-oriented and now DNR/DNI. We will arrange for outpatient follow-up if able to go home. Palliative care asking for hospice consult.      GI bleed:  - Continues to bleed with hydrocortisone  suppositories and Cipro. We will changed suppository to foam and repeated pouchoscopy 1/29.  Found one small actively oozing ulcer in the pouch, treated with epinephrine and bicap cautery.   - CT angio without bleed, Tagged RBC with blush at left flank - small bowel bleed. Please see the subjective regarding discussions we had about patency, then VCE and where to go from there  - Holding anticoagulation in setting of bleed  - Transfuse for hemoglobin < 7 or hematocrit <  21  - Continue PPI BID  - Obtaining TEG  - Discussed options for pouchitis thought to be venous bleeding again with IR they will review with attending Dr. Georges Lynch    Mild Ileus: resolved  - We stopped senna, Miralax and colace. Patient is not clinically with ileus or obstructed though imaging is suggestive    Concern for cholangitis:  - From notes, patient was at [facility] for ERCP on January 4th, 2021. He had a stent placed, and based on telephone notes, then became jaundiced 12/12/2019 at which point he went back to [place]. There, he had biliary stent removed. His most recent MR/MRCP at [facility] on 12/22/2019 shows intrahepatic duct beading, without main duct narrowing. Reviewed the imaging with advanced endoscopist, and the utility of stent in addressing hyperbilirubinemia is low. Will not plan for repeat ERCP here  - We stopped empiric Zosyn (was started at admission) because blood cultures are NGTD     DVT/PE-   - Patient had bloody BMs, and heparin drip on hold  - Previously had discussed with patient the short and long term risk/benefits for anticoagulation. We decided together that we'll wait for bleed to resolve, and as an outpatient will discuss starting anticoagulation or not. Depending on our discussions with the patient, he may lean towards comfort measures only and no anticoagulation as outpatient. Otherwise, as noted before, he has no PCP and if we were to place him on warfarin, it may be more difficult to titrate this medication for him. With  apixaban, the patient is Marcello Moores C, and apixaban is not recommended in use of such patients. Lovenox might be the best option for him, plus if he decides to pursue biopsy after all, this could be stopped more easily in anticipation of biopsy    Abdominal pain:  - Currently on tylenol 622m every 8 hours   - Continue gabapentin 3012mat night (given his symptoms are most pronounced at night)   - Continue Palliative Care recommendations     Malnutrition  - Nutrition consult appreciated    UC s/p total colectomy w/ Jpouch creation 2010  - continue home mesalamine and started Cipro and hydrocortisone as above    F: PO    E:daily labs, replete PRN  N:Regular with supplements    DVT ppAVW:UJWJ Bowel reg:Not needed right now    PUD ppx: PPI BID    Dispo: pending clinical course     JaDannielle HuhNP    I saw, examined and evaluated the patient, reviewed the patient chart and the relevant images and lab findings. On my exam I find....  S: Had bloody bowel moment last evening, since then three small stools which are normal.   O: Alert and Oriented x3        Vital signs reviewed and stable    Icteric sclera   Chest:Goodair entry bilaterally    CVS: S1+ and S2+, RRR   Abdomen:  BS+, NT. No free fluid   Extremities: No pitting edema in both lower extremities   CNS: Non focal no asterixis    Plan of care to...  [place]ith possible cholangio carcinoma and very high CA19-9  Lower GI bleed, mostly coming from the pouch. Had some endoscopic therapy .  No hemo dynamic instability and no transfusion requirement  Continue rectal steroid foam in to the pouch  Patient is DNR/DNI  GOStasia CavalierMD

## 2019-12-31 NOTE — Progress Notes (Signed)
Weekend Social Work Progress Note:   Probation officer paged to submit hospice referral, Probation officer did so to hospice intake email in [employer] to provide sign out to primary [place], [place]   Social Worker on [phone]  [phone]; pager [phone]

## 2020-01-01 ENCOUNTER — Encounter: Payer: PRIVATE HEALTH INSURANCE | Admitting: Gastroenterology

## 2020-01-01 ENCOUNTER — Other Ambulatory Visit: Payer: Self-pay | Admitting: Cardiology

## 2020-01-01 DIAGNOSIS — R748 Abnormal levels of other serum enzymes: Secondary | ICD-10-CM

## 2020-01-01 DIAGNOSIS — M810 Age-related osteoporosis without current pathological fracture: Secondary | ICD-10-CM

## 2020-01-01 DIAGNOSIS — Z01818 Encounter for other preprocedural examination: Secondary | ICD-10-CM

## 2020-01-01 DIAGNOSIS — R1011 Right upper quadrant pain: Secondary | ICD-10-CM

## 2020-01-01 DIAGNOSIS — R791 Abnormal coagulation profile: Secondary | ICD-10-CM

## 2020-01-01 DIAGNOSIS — K51911 Ulcerative colitis, unspecified with rectal bleeding: Secondary | ICD-10-CM

## 2020-01-01 DIAGNOSIS — Z0000 Encounter for general adult medical examination without abnormal findings: Secondary | ICD-10-CM

## 2020-01-01 DIAGNOSIS — R17 Unspecified jaundice: Secondary | ICD-10-CM

## 2020-01-01 DIAGNOSIS — R636 Underweight: Secondary | ICD-10-CM

## 2020-01-01 DIAGNOSIS — D473 Essential (hemorrhagic) thrombocythemia: Secondary | ICD-10-CM

## 2020-01-01 DIAGNOSIS — R634 Abnormal weight loss: Secondary | ICD-10-CM

## 2020-01-01 DIAGNOSIS — M6284 Sarcopenia: Secondary | ICD-10-CM

## 2020-01-01 DIAGNOSIS — D649 Anemia, unspecified: Secondary | ICD-10-CM

## 2020-01-01 DIAGNOSIS — Z792 Long term (current) use of antibiotics: Secondary | ICD-10-CM

## 2020-01-01 DIAGNOSIS — I499 Cardiac arrhythmia, unspecified: Secondary | ICD-10-CM

## 2020-01-01 LAB — CBC AND DIFFERENTIAL
Baso # K/uL: 0 10*3/uL (ref 0.0–0.1)
Basophil %: 0.1 %
Eos # K/uL: 0.1 10*3/uL (ref 0.0–0.5)
Eosinophil %: 1.5 %
Hematocrit: 21 % — ABNORMAL LOW (ref 40–51)
Hemoglobin: 6.8 g/dL — ABNORMAL LOW (ref 13.7–17.5)
IMM Granulocytes #: 0.1 10*3/uL — ABNORMAL HIGH (ref 0.0–0.0)
IMM Granulocytes: 1.1 %
Lymph # K/uL: 0.7 10*3/uL — ABNORMAL LOW (ref 1.3–3.6)
Lymphocyte %: 7.9 %
MCH: 31 pg/cell (ref 26–32)
MCHC: 32 g/dL (ref 32–37)
MCV: 96 fL — ABNORMAL HIGH (ref 79–92)
Mono # K/uL: 1.2 10*3/uL — ABNORMAL HIGH (ref 0.3–0.8)
Monocyte %: 12.9 %
Neut # K/uL: 7.2 10*3/uL — ABNORMAL HIGH (ref 1.8–5.4)
Nucl RBC # K/uL: 0 10*3/uL (ref 0.0–0.0)
Nucl RBC %: 0 /100 WBC (ref 0.0–0.2)
Platelets: 267 10*3/uL (ref 150–330)
RBC: 2.2 MIL/uL — ABNORMAL LOW (ref 4.6–6.1)
RDW: 19.1 % — ABNORMAL HIGH (ref 11.6–14.4)
Seg Neut %: 76.5 %
WBC: 9.4 10*3/uL — ABNORMAL HIGH (ref 4.2–9.1)

## 2020-01-01 LAB — COMPREHENSIVE METABOLIC PANEL
ALT: 151 U/L — ABNORMAL HIGH (ref 0–50)
AST: 294 U/L — ABNORMAL HIGH (ref 0–50)
Albumin: 2.4 g/dL — ABNORMAL LOW (ref 3.5–5.2)
Alk Phos: 1152 U/L — ABNORMAL HIGH (ref 40–130)
Anion Gap: 10 (ref 7–16)
Bilirubin,Total: 21.8 mg/dL — ABNORMAL HIGH (ref 0.0–1.2)
CO2: 23 mmol/L (ref 20–28)
Calcium: 8.3 mg/dL — ABNORMAL LOW (ref 9.0–10.3)
Chloride: 101 mmol/L (ref 96–108)
Creatinine: 0.62 mg/dL — ABNORMAL LOW (ref 0.67–1.17)
GFR,Black: 140 *
GFR,Caucasian: 121 *
Glucose: 106 mg/dL — ABNORMAL HIGH (ref 60–99)
Lab: 12 mg/dL (ref 6–20)
Potassium: 3.7 mmol/L (ref 3.3–5.1)
Sodium: 134 mmol/L (ref 133–145)
Total Protein: 4.7 g/dL — ABNORMAL LOW (ref 6.3–7.7)

## 2020-01-01 LAB — TEG REVIEW

## 2020-01-01 LAB — PROTIME-INR
INR: 1.2 — ABNORMAL HIGH (ref 0.9–1.1)
Protime: 13.9 s — ABNORMAL HIGH (ref 10.0–12.9)

## 2020-01-01 LAB — PHOSPHORUS: Phosphorus: 2.7 mg/dL (ref 2.7–4.5)

## 2020-01-01 LAB — REVIEWD BY:

## 2020-01-01 LAB — INTERPRETATION, TEG

## 2020-01-01 LAB — MAGNESIUM: Magnesium: 2.1 mg/dL (ref 1.6–2.5)

## 2020-01-01 MED ORDER — DOCUSATE SODIUM 100 MG PO CAPS *I*
100.0000 mg | ORAL_CAPSULE | Freq: Two times a day (BID) | ORAL | Status: DC
Start: 2020-01-01 — End: 2020-01-05
  Administered 2020-01-01 – 2020-01-05 (×8): 100 mg via ORAL
  Filled 2020-01-01 (×8): qty 1

## 2020-01-01 MED ORDER — HYDROMORPHONE HCL 4 MG PO TABS *I*
4.0000 mg | ORAL_TABLET | ORAL | Status: DC | PRN
Start: 2020-01-01 — End: 2020-01-05
  Administered 2020-01-01 – 2020-01-05 (×12): 4 mg via ORAL
  Filled 2020-01-01 (×12): qty 1

## 2020-01-01 MED ORDER — NADOLOL 20 MG PO TABS *I*
20.0000 mg | ORAL_TABLET | Freq: Every day | ORAL | Status: DC
Start: 2020-01-01 — End: 2020-01-05
  Administered 2020-01-01 – 2020-01-05 (×5): 20 mg via ORAL
  Filled 2020-01-01 (×5): qty 1

## 2020-01-01 MED ORDER — HYDROMORPHONE HCL 4 MG PO TABS *I*
6.0000 mg | ORAL_TABLET | ORAL | Status: DC | PRN
Start: 2020-01-01 — End: 2020-01-05
  Administered 2020-01-05: 6 mg via ORAL
  Filled 2020-01-01: qty 1

## 2020-01-01 MED ORDER — HYDROXYZINE HCL 10 MG PO TABS *I*
10.0000 mg | ORAL_TABLET | Freq: Three times a day (TID) | ORAL | Status: DC
Start: 2020-01-01 — End: 2020-01-02
  Administered 2020-01-01 – 2020-01-02 (×4): 10 mg via ORAL
  Filled 2020-01-01 (×4): qty 1

## 2020-01-01 MED ORDER — LORAZEPAM 1 MG PO TABS *I*
1.0000 mg | ORAL_TABLET | ORAL | Status: DC | PRN
Start: 2020-01-01 — End: 2020-01-05

## 2020-01-01 MED ORDER — HYDROMORPHONE HCL 2 MG PO TABS *I*
2.0000 mg | ORAL_TABLET | ORAL | Status: DC | PRN
Start: 2020-01-01 — End: 2020-01-05
  Administered 2020-01-01 – 2020-01-05 (×7): 2 mg via ORAL
  Filled 2020-01-01 (×7): qty 1

## 2020-01-01 MED ORDER — ESCITALOPRAM OXALATE 10 MG PO TABS *I*
10.0000 mg | ORAL_TABLET | Freq: Every day | ORAL | Status: DC
Start: 2020-01-01 — End: 2020-01-05
  Administered 2020-01-01 – 2020-01-05 (×5): 10 mg via ORAL
  Filled 2020-01-01 (×5): qty 1

## 2020-01-01 NOTE — Progress Notes (Addendum)
Writer's contact with patient was via phone.      Patient requesting to complete POA and Will. SW advised that Probation officer is unable to assist with will completion, however, would be able to facilitate POA completion. SW left voicemail inquiring about notary availability.    Writer's contact with patient was via face-to-face PPE used: mask, gloves, and eye protection.   Was patient masked during visit: yes.     SW met with patient and notary, Kyle Bright, to complete POA paperwork. Paperwork left with patient. Patient aware that [name] will need to have her portion notarized.        Kyle Bright, LMSW  Wifi: [phone]; Office: [phone]

## 2020-01-01 NOTE — Progress Notes (Addendum)
Touched base with patient about decision to go hospice. Patient states his is still undecided on how to proceed and voiced that he would like writer to hold off on placing a referral just yet. He is also undecided on where he plans to discharge to, be it with family or back to [place]. Agreed to touch base again tomorrow after he has had more time to discuss with Palliative today and decide on course of action.    1425: Spoke to patient again today to inquire about PCP. Patient states he currently has no PCP.    Noberto Retort, RN-BC  Acute Care Coordinator [phone]  Phone # [phone]  Pager # [phone]  Weekend Pager # [phone]

## 2020-01-01 NOTE — Progress Notes (Signed)
SW received call from [place] follow up regarding referral placed over the weekend. Upon further discussion, referral was cancelled as [facility] does not service the [place] area. Care coordinator to place appropriate hospice referral.    Maryan Char, LMSW  Wifi: [phone]; Office: [phone]

## 2020-01-01 NOTE — Progress Notes (Signed)
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    Significant 24h Events: Now DNR/DNI transitioning towards comfort measures. Bloody BMs overnight with drop in HCt to 21. Hospice referral with wish to go home when pain controlled.     Subjective: Attempted to see patient earlier but he was in restroom. When I returned he was lying in bed talking with his significant other Kyle Bright. He is completing POA paperwork and interested in getting home as soon as possible. He is still having bloody BMs but is asymptomatic besides sone tachycardia. He wants to get home with nursing care but currently still interested in transfusions which will not be covered under hospice. With bloody BM he may be able to get transfusion today and hopeful to be able to be discharged home tomorrow. Kyle Bright inquiring about transportation back to [place] area.     Palliative Care ROS: Pain in back and abdomen controlled with current dosages 2 mg/4 mg po Dilaudid. + constipation, negative nausea, poor appetite.  Denies dizziness + tachycardia with activity.     Patient Active Problem List   Diagnosis Code    PSC (primary sclerosing cholangitis) K83.01    Primary sclerosing cholangitis K83.01       Allergies   Allergen Reactions    Adhesive Tape Rash    Sulfa Antibiotics Rash     Rash on legs        Scheduled Meds:    hydrocortisone-pramoxine  1 applicator Rectal 2 times per day    simethicone  80 mg Oral 4x Daily    mesalamine  4.8 g Oral Daily with breakfast    lidocaine  1 patch Transdermal Q24H    ciprofloxacin  500 mg Oral 2 times  per day    pantoprazole  40 mg Intravenous Q12H    acetaminophen  650 mg Oral BID       Continuous Infusions:    sodium chloride         PRN Meds:  HYDROmorphone **OR** HYDROmorphone, HYDROmorphone, sodium chloride, dextrose, bisacodyl, sodium chloride, dextrose, LORazepam, haloperidol lactate, hydrOXYzine HCl, sodium chloride, dextrose    Past 24h PRN Usage:  - Dilaudid 2 mg po x 2  - Dilaudid 4 mg po x 2    Physical Examination:   BP: (101-118)/(64-68)   Temp:  [36.7 C (98.1 F)-37.5 C (99.5 F)]   Temp src: Temporal (02/01 0856)  Heart Rate:  [89-104]   Resp:  [16-18]   SpO2:  [98 %-100 %]   Weight:  [53.3 kg (117 lb 8 oz)]   General appearance: Cachectic frail jaundiced young male lying in bed.  Head: atraumatic but with temporal wasting ans icteric sclera  Lungs: CTA, respirations easy RRR. Mediport  Heart: mild tachy  Abdomen: sl distended, mild tender  Extremities: warm, dry, left arm HL  Neurologic: A+ O x 3. Very direct with his communication of his wishes and plans. Mentions difficulty with the vague longevity of his life with his disease.     Assessment/Plan:  [name] is a 44 yo male with UC and liver disease secondary to primary sclerosing cholangitis and now suspected cholangiocarcinoma. I spoke with  him while his GF Kyle Bright was on the phone with him. He continues with GIB but has voiced wish to get home at least for a short time with nursing service. His wishes are to continue to have labs/trasnfusion which I explained is not in line with hospice care. He would be amenable to return to the hospital for symptom management and likely transition to hospice at that time.  I updated the hepatology team of our conversation ans his wishes.     Pain/Dyspnea  - acetaminophen 650 mg po bid  - proctofoam-HC bid   - Lidoderm patch every day  - simethicone 80 mg po qid      Anxiety/Agitation/nausea  - Haldol 0.5 mg  q 3 hrs prn nausea and first line aggitation  - ativan 0.5 mg po q 4 hrs prn  anxiety    Elimination/UC/LGIB/Constipation prevention, last BM 2/1  - off bowel meds per GI  - Mesalamine 4.8 G every day  - PPI bid    GOC/Prognosis: remains DNR/DNI not ready for comfort measures and wants to purse labs/transfusions at this time and return to hospital for symptom management needs as necessary but strongly desires some time at home. Prognosis likely weeks or less if continues to GIB.    [name] ANP  Palliative Care Service  [facility] [phone]  01/01/2020 9:58 AM

## 2020-01-01 NOTE — Progress Notes (Addendum)
Transplant Hepatology Follow up    Admit Date:  12/20/2019    Interval Events:   Bloody bowel movement overnight  Hct 24->21    Subjective:   Reports a bloody bowel movement overnight  Complaints of constant itching  Wife [name] at the phone during encounter    Medications:    Scheduled Meds:   hydrocortisone-pramoxine  1 applicator Rectal 2 times per day    simethicone  80 mg Oral 4x Daily    mesalamine  4.8 g Oral Daily with breakfast    lidocaine  1 patch Transdermal Q24H    ciprofloxacin  500 mg Oral 2 times per day    pantoprazole  40 mg Intravenous Q12H    acetaminophen  650 mg Oral BID     Continuous Infusions:   sodium chloride       PRN Meds:.HYDROmorphone **OR** HYDROmorphone, HYDROmorphone, sodium chloride, dextrose, bisacodyl, sodium chloride, dextrose, LORazepam, haloperidol lactate, hydrOXYzine HCl, sodium chloride, dextrose    Physical examination:      BP 114/65    Pulse 93    Temp 36.7 C (98.1 F) (Temporal)    Resp 16    Ht 175.3 cm (5' 9" )    Wt 52.8 kg (116 lb 8 oz)    SpO2 99%    BMI 17.20 kg/m   General appearance: awake and alert, jaundiced, thin  Cardiovascular: regular rate and rhythm, S1, S2 normal  Respiratory CTAB. No crackles or wheezes  Gastrointestinal: Normoactive bowel sounds. Abdomen soft, tender and very mildly distended   Skin: jaundice with excoriations   Neurological: grossly normal    LAB DATA:         Lab results: 01/01/20  0022 12/31/19  1219 12/31/19  0011 12/30/19  1627 12/30/19  1117   WBC 9.4* 9.6* 9.8* 8.5 9.3*   Hemoglobin 6.8* 7.7* 7.4* 7.1* 7.8*   Hematocrit 21* 24* 23* 21* 23*   RBC 2.2* 2.5* 2.4* 2.3* 2.5*   Platelets 267 275 297 266 296           Lab results: 01/01/20  0022 12/31/19  0011 12/30/19  0137 12/29/19  0440 12/28/19  0536   Sodium 134 135 139 136 134   Potassium 3.7 3.7 3.8 3.6 3.5   Chloride 101 101 103 101 99   CO2 23 23 25 26 25    UN 12 12 13 8 9    Creatinine 0.62* 0.66* 0.65* 0.64* 0.63*   GFR,Caucasian 121 118 119 120 121   GFR,Black 140  137 138 138 139   Glucose 106* 100* 96 109* 114*   Calcium 8.3* 8.3* 8.3* 8.5* 8.6*   Total Protein 4.7* 4.9* 4.9* 4.8* 5.0*   Albumin 2.4* 2.5* 2.5* 2.6* 2.7*   ALT 151* 137* 120* 105* 98*   AST 294* 288* 236* 208* 185*   Alk Phos 1,152* 1,031* 903* 792* 721*   Bilirubin,Total 21.8* 23.6* 23.9* 24.2* 24.5*           Lab results: 01/01/20  0022   INR 1.2*     MELD-Na score: 22 at 01/01/2020 12:22 AM  MELD score: 20 at 01/01/2020 12:22 AM  Calculated from:  Serum Creatinine: 0.62 mg/dL (Rounded to 1 mg/dL) at 01/01/2020 12:22 AM  Serum Sodium: 134 mmol/L at 01/01/2020 12:22 AM  Total Bilirubin: 21.8 mg/dL at 01/01/2020 12:22 AM  INR(ratio): 1.2 at 01/01/2020 12:22 AM  Age: 44 years 2 months      IMAGING:   No results found.  ASSESSMENT/RECOMMENDATIONS:  44 yo man with PMHx [place] and UC s/p total colectomy w/ J pouch creation in 2010 who was transferred from OSH for further management of obstructive jaundice/worsening liver disease. Presented at OSH with complaints of increasing abd pain, jaundice, and darkened urine. His OSH course was notable for removal of previously placed CBD stent (12/04/19 as an outpatient) and was c/b new onset DVT/PE and he was started on a heparin drip. Hospital course here notable for GI bleed and concerning ill-defined heterogenous attenuation of right hepatic lobe on CT scan.     [place] now with new ill-defined heterogenous attenuation of right hepatic lobe-   MELD-Na score: 22 at 01/01/2020 12:22 AM  MELD score: 20 at 01/01/2020 12:22 AM  Calculated from:  Serum Creatinine: 0.62 mg/dL (Rounded to 1 mg/dL) at 01/01/2020 12:22 AM  Serum Sodium: 134 mmol/L at 01/01/2020 12:22 AM  Total Bilirubin: 21.8 mg/dL at 01/01/2020 12:22 AM  INR(ratio): 1.2 at 01/01/2020 12:22 AM  Age: 22 years 2 months   - Blood group O  - Patient was listed for transplant but now is on internal hold given concerns for infiltrative cholangiocarcinoma  - Appreciate Medical Oncology's involvement. Likely with limited chemotherapy options  given elevated bilirubin. Discussed imaging with advanced endoscopist, and limited utility in ERCP for stenting. We will arrange for outpatient follow-up and liver Bx.   - Appreciate Palliative Care involvement and patient is comfort-oriented and now DNR/DNI. We will arrange for outpatient follow-up if able to go home. Palliative care asking for hospice consult.      GI bleed:  - Continues to bleed with hydrocortisone suppositories and Cipro. We will changed suppository to foam and repeated pouchoscopy 1/29.  Found one small actively oozing ulcer in the pouch, treated with epinephrine and bicap cautery.   - CT angio without bleed, Tagged RBC with blush at left flank - small bowel bleed.   - Holding anticoagulation in setting of bleed  - Transfuse for hemoglobin < 7 or hematocrit < 21  - Continue PPI BID  - Obtaining TEG   - Discussed options for pouchitis thought to be venous bleeding again with IR they will review with attending Dr. Georges Lynch  - Ddx also includes ectopic varices in the small bowel, will start low dose nadolol 57m daily today. If he has good response, will increase to 455mdaily. Will also consider the addition of octreotide injection.    Concern for cholangitis:  - [facility]. He had a stent placed, and based on telephone notes, then became jaundiced 12/12/2019 at which point [facility]. [facility] shows intrahepatic duct beading, without main duct narrowing. Reviewed the imaging with advanced endoscopist, and the utility of stent in addressing hyperbilirubinemia is low. Will not plan for repeat ERCP here  - We stopped empiric Zosyn (was started at admission) because blood cultures are NGTD     DVT/PE-   - Patient had bloody BMs, and heparin drip on hold  - Previously had discussed with patient the short and long term risk/benefits for anticoagulation. We decided  together that we'll wait for bleed to resolve, and as an outpatient will discuss starting anticoagulation or not. Depending on our discussions with the patient, he may lean towards comfort measures only and no anticoagulation as outpatient. Otherwise, as noted before, he has no PCP and if we were to place him  on warfarin, it may be more difficult to titrate this medication for him. With apixaban, the patient is Marcello Moores C, and apixaban is not recommended in use of such patients. Lovenox might be the best option for him, plus if he decides to pursue biopsy after all, this could be stopped more easily in anticipation of biopsy    Abdominal pain:  - Currently on tylenol 638m every 8 hours   - Continue gabapentin 3044mat night (given his symptoms are most pronounced at night)   - Continue Palliative Care recommendations     Itching:  - start low dose atarax 1064mID    Depression / Anxiety   - start escitalopram 75m92mily as recommended by Palliative team  - Palliative Care recommendations appreciated      Malnutrition  - Nutrition consult appreciated    UC s/p total colectomy w/ Jpouch creation 2010  - continue home mesalamine and started Cipro and hydrocortisone as above    F: PO    E:daily labs, replete PRN  N:Regular with supplements    DVT ppx:OVZ:CHYIowel reg:Not needed right now    PUD ppx: PPI BID    Dispo: pending clinical course     AnjaKristen Cardinal    Patient was reviewed at table rounds with transplant surgery and further reviewed on rounds.I saw and evaluated the patient. I agree with the resident's/fellow's findings and plan of care as documented above.      Open notes: Please note all efforts are made to assure accuracy of this note. Mistakes can be made as we do not have scribes and real time note taking. Information detailed in the note was taken from direct patient interviews as well as chart review. If you find something important you wish corrected in the record please let us  [place].            Dr. MariOrie FishermanryGala Murdoch.CM, FRCPWhyt Med, GI)  Hepatologist, Solid Organ Transplant Program  Associate Professor of [facility]

## 2020-01-02 ENCOUNTER — Inpatient Hospital Stay: Payer: PRIVATE HEALTH INSURANCE

## 2020-01-02 DIAGNOSIS — I82432 Acute embolism and thrombosis of left popliteal vein: Secondary | ICD-10-CM

## 2020-01-02 DIAGNOSIS — I82452 Acute embolism and thrombosis of left peroneal vein: Secondary | ICD-10-CM

## 2020-01-02 DIAGNOSIS — I82442 Acute embolism and thrombosis of left tibial vein: Secondary | ICD-10-CM

## 2020-01-02 DIAGNOSIS — I82812 Embolism and thrombosis of superficial veins of left lower extremities: Secondary | ICD-10-CM

## 2020-01-02 LAB — COMPREHENSIVE METABOLIC PANEL
ALT: 168 U/L — ABNORMAL HIGH (ref 0–50)
AST: 326 U/L — ABNORMAL HIGH (ref 0–50)
Albumin: 2.4 g/dL — ABNORMAL LOW (ref 3.5–5.2)
Alk Phos: 1370 U/L — ABNORMAL HIGH (ref 40–130)
Anion Gap: 13 (ref 7–16)
Bilirubin,Total: 21.8 mg/dL — ABNORMAL HIGH (ref 0.0–1.2)
CO2: 21 mmol/L (ref 20–28)
Calcium: 8.1 mg/dL — ABNORMAL LOW (ref 9.0–10.3)
Chloride: 102 mmol/L (ref 96–108)
Creatinine: 0.68 mg/dL (ref 0.67–1.17)
GFR,Black: 135 *
GFR,Caucasian: 117 *
Glucose: 135 mg/dL — ABNORMAL HIGH (ref 60–99)
Lab: 12 mg/dL (ref 6–20)
Potassium: 3.7 mmol/L (ref 3.3–5.1)
Sodium: 136 mmol/L (ref 133–145)
Total Protein: 4.7 g/dL — ABNORMAL LOW (ref 6.3–7.7)

## 2020-01-02 LAB — CBC AND DIFFERENTIAL
Baso # K/uL: 0 10*3/uL (ref 0.0–0.1)
Basophil %: 0.1 %
Eos # K/uL: 0.2 10*3/uL (ref 0.0–0.5)
Eosinophil %: 1.8 %
Hematocrit: 21 % — ABNORMAL LOW (ref 40–51)
Hemoglobin: 6.6 g/dL — ABNORMAL LOW (ref 13.7–17.5)
IMM Granulocytes #: 0.2 10*3/uL — ABNORMAL HIGH (ref 0.0–0.0)
IMM Granulocytes: 1.2 %
Lymph # K/uL: 0.8 10*3/uL — ABNORMAL LOW (ref 1.3–3.6)
Lymphocyte %: 6.2 %
MCH: 31 pg/cell (ref 26–32)
MCHC: 32 g/dL (ref 32–37)
MCV: 97 fL — ABNORMAL HIGH (ref 79–92)
Mono # K/uL: 1.3 10*3/uL — ABNORMAL HIGH (ref 0.3–0.8)
Monocyte %: 10.4 %
Neut # K/uL: 9.9 10*3/uL — ABNORMAL HIGH (ref 1.8–5.4)
Nucl RBC # K/uL: 0 10*3/uL (ref 0.0–0.0)
Nucl RBC %: 0 /100 WBC (ref 0.0–0.2)
Platelets: 290 10*3/uL (ref 150–330)
RBC: 2.1 MIL/uL — ABNORMAL LOW (ref 4.6–6.1)
RDW: 18.8 % — ABNORMAL HIGH (ref 11.6–14.4)
Seg Neut %: 80.3 %
WBC: 12.4 10*3/uL — ABNORMAL HIGH (ref 4.2–9.1)

## 2020-01-02 LAB — EKG 12-LEAD
P: 69 deg
PR: 152 ms
QRS: 72 deg
QRSD: 90 ms
QT: 360 ms
QTc: 441 ms
Rate: 90 {beats}/min
T: 97 deg

## 2020-01-02 LAB — TYPE AND SCREEN
ABO RH Blood Type: O POS
Antibody Screen: NEGATIVE

## 2020-01-02 LAB — PHOSPHORUS: Phosphorus: 2.2 mg/dL — ABNORMAL LOW (ref 2.7–4.5)

## 2020-01-02 LAB — MAGNESIUM: Magnesium: 2 mg/dL (ref 1.6–2.5)

## 2020-01-02 LAB — PROTIME-INR
INR: 1.3 — ABNORMAL HIGH (ref 0.9–1.1)
Protime: 14.6 s — ABNORMAL HIGH (ref 10.0–12.9)

## 2020-01-02 MED ORDER — HYDROXYZINE HCL 10 MG PO TABS *I*
20.0000 mg | ORAL_TABLET | Freq: Three times a day (TID) | ORAL | Status: DC
Start: 2020-01-02 — End: 2020-01-03
  Administered 2020-01-02 – 2020-01-03 (×3): 20 mg via ORAL
  Filled 2020-01-02 (×4): qty 2

## 2020-01-02 MED ORDER — POTASSIUM PHOSPHATE 3 MMOL/ML ORAL LIQUID *I*
15.0000 mmol | Freq: Four times a day (QID) | ORAL | Status: DC
Start: 2020-01-02 — End: 2020-01-02
  Administered 2020-01-02: 15 mmol via ORAL
  Filled 2020-01-02 (×4): qty 5

## 2020-01-02 MED ORDER — PROSOURCE NO CARB PO LIQD *I*
30.0000 mL | Freq: Two times a day (BID) | ORAL | Status: DC
Start: 2020-01-02 — End: 2020-01-02
  Administered 2020-01-02: 30 mL via ORAL

## 2020-01-02 MED ORDER — PANTOPRAZOLE SODIUM 40 MG PO TBEC *I*
40.0000 mg | DELAYED_RELEASE_TABLET | Freq: Two times a day (BID) | ORAL | Status: DC
Start: 2020-01-02 — End: 2020-01-05
  Administered 2020-01-02 – 2020-01-05 (×6): 40 mg via ORAL
  Filled 2020-01-02 (×7): qty 1

## 2020-01-02 MED ORDER — POLYETHYLENE GLYCOL 3350 PO PACK 17 GM *I*
17.0000 g | PACK | Freq: Once | ORAL | Status: DC
Start: 2020-01-02 — End: 2020-01-02

## 2020-01-02 MED ORDER — POLYETHYLENE GLYCOL 3350 PO PACK 17 GM *I*
17.0000 g | PACK | Freq: Every day | ORAL | Status: DC
Start: 2020-01-02 — End: 2020-01-05
  Administered 2020-01-03 – 2020-01-04 (×2): 17 g via ORAL
  Filled 2020-01-02 (×3): qty 17

## 2020-01-02 NOTE — Provider Consult (Addendum)
Hematology Progress Note    Patient ID: 44 year old male with past medical history including UC status post complete colectomy and end-stage liver disease sees from [facility], recent ERCP with CBD stent placed 1/4, suspected cholangiocarcinoma.  Admitted on 1/20 as a transfer from an outside hospital with PE and possible cholangitis, but developed a GI bleed on anticoagulation.  Also recently developed an additional DVT in left lower extremity.    Subjective/Interval Events:   Patient just met with palliative care, and understands his prognosis to be a matter of days to weeks. He is sad because of this news. He desires to return home to be with family and friends.    He states some SOB and dyspnea with ambulation. Denies chest pain. Has some LLE pain where the DVT appeared.    Physical Exam:   General: depressed affect, cachectic  HEENT: MMM, scleral icterus  CV: RRR, no MRG  Lungs: CTAB, tachypnea  Abdominal: non-tender, non-distended  MSK: LLE ankle edema, jaundice  Neuro: alert and oriented, moving all 4 extremities    Labs were reviewed and are pertinent for:  Cr .7  ALT 168, AST 326, Alk phos 1370, Tbili 22, INR 1.3  WBC 12.4  Hgb 6.6  plt 290    Imaging:   CT A/P w/ contrast on 12/18/19: increasing abdominal ascites w/ continued interval distension of gallbladder and underlying peritonitis cannot be excluded. Stable heterogeneity throughout R hepatic lobe which remains nonspecific and underlying infectious etiology vs malignancy remains on differential. Medium R sided pleural effusion w/ subsegmental atelectasis vs PNA    CT abdomen pelvis 12/12/2019  multiple new ill-defined areas of hypodensity, predominantly within the right hepatic lobe.  These could represent hepatic abscesses versus metastatic disease.  A biliary stent appears appropriately positioned.  There is no biliary ductal dilatation.  There is no gallbladder wall edema.  Right upper quadrant ascites.  Prominent upper abdominal and retroperitoneal lymph  nodes are likely reactive.  New anterior diaphragmatic/cardiophrenic lymph nodes may be reactive or metastatic.    Abdominal ultrasound 12/12/2019: Patent portal vein, hepatic artery, and hepatic veins with normal flow direction.    MRI abdomen 12/12/19: Lobulated contour of the liver.  Unchanged cyst with layering stones in the left hepatic lobe that may represent a biliary cyst.  Areas of hypoenhancement in the right hepatic lobe in the distribution of the portal triads.  This may represent liver abscesses.  Distended gallbladder, gallbladder wall edema, no biliary ductal dilatation, no gallstones.  Right upper quadrant free fluid.  Upper abdominal and retroperitoneal lymph nodes, likely reactive.    Ultrasound right lower extremity venous Doppler 12/11/2019: Nonocclusive thrombus of the right popliteal and peroneal veins.    CTA Chest on 12/12/19: R lower lobe segmental and subsegmental pulmonary emobli. Anterior cardiophrenic adenopathy, reactive vs malignant.     HIDA 12/20/19: activity remains within the hepatic parenchyma. Findings most consistent with hepatocellular dysfunction. There is no biliary ductal visualization.     Tagged RBC scan 12/28/19: Findings consistent with active GI bleed likely from small bowel.    Impression/Plan:  44 year old male with PMH including UC status post total colectomy with J-pouch, PSC, liver failure with recent stent placed last month, now removed, and progressive liver disease who presented at an OSH with abdominal pain, jaundice a PE was identified as well has possible cholangitis treated with Zosyn and imaging findings concerning for cholangiocarcinoma.  The patient was transferred to [facility] for management of these issues.  Hospital course complicated by GI  bleed, with hemoglobin initially 12.7 on 1/21, now 6.6 today.  Has received 2 units of PRBCs this admission.  GI treated an actively oozing ulcer in his pouch with epinephrine and cautery.  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 No esophageal or gastric source of bleeding seen on EGD.  The patient is leaning towards a comfort oriented approach and is starting discussions with palliative care.  His anticoagulation is on hold given his active bleeding.  Hematology was consulted for recommendation of [facility] management. Recommend avoiding any AC, including ppx, as the patient continues to have declining H/H, indicating active bleed.    Recommendations:  - avoid AC in setting of active bleeding  - not a candidate for IVC filter  - can consider AC in the future if bleeding resolves and if in congruence with the patient's goals of care, but would not start with therapeutic dosing    Patient was seen and discussed with Dr. Donalee Citrin.    Signed by: Kreg Shropshire, MD, PhD   Internal Medicine Resident PGY-3   01/02/2020 2:20 PM  Please excuse any unintentional errors as this note was written using voice dictation software.      Attending attestation  I discussed the above patient's plan of care with Dr. Milana Obey and the patient. I reviewed all the relevant laboratory data and images, which was incorporated into the medical decisions regarding the evaluation and management of this patient. I agree with the resident's history, exam, and assessment and plan of care as documented above.    Sydnee Levans, MD

## 2020-01-02 NOTE — Progress Notes (Addendum)
Transplant Hepatology Follow up    Admit Date:  12/20/2019    Interval Events:   Hct 24->21->21  No bloody bowel movements  US doppler LLE: showed Complete occlusive clot involving peroneal vein and posterior tibial vein. The clot is in close proximity to popliteal vein. Small right Baker's cyst. Thrombosed superficial vein seen posterior to popliteal vessels.    Subjective:   Had a good sleep last night.  Itching persist but is improved. Denies bloody bowel movements.   The patient asks for further [place] discussion with Palliative Care.   Wife [name] at the phone during encounter    Medications:    Scheduled Meds:   hydrOXYzine HCl  10 mg Oral TID    nadolol  20 mg Oral Daily    escitalopram  10 mg Oral Daily    docusate sodium  100 mg Oral 2 times per day    hydrocortisone-pramoxine  1 applicator Rectal 2 times per day    simethicone  80 mg Oral 4x Daily    mesalamine  4.8 g Oral Daily with breakfast    lidocaine  1 patch Transdermal Q24H    ciprofloxacin  500 mg Oral 2 times per day    pantoprazole  40 mg Intravenous Q12H    acetaminophen  650 mg Oral BID     Continuous Infusions:    PRN Meds:.HYDROmorphone **OR** HYDROmorphone, HYDROmorphone, LORazepam, sodium chloride, dextrose, bisacodyl, haloperidol lactate    Physical examination:      BP 119/71    Pulse 96    Temp 37.2 C (99 F) (Oral)    Resp 16    Ht 175.3 cm (_0 )    Wt 53.3 kg (117 lb 8 oz)    SpO2 100%    BMI 17.35 kg/m   General appearance: awake and alert, jaundiced, thin  Cardiovascular: regular rate and rhythm, S1, S2 normal  Respiratory CTAB. No crackles or wheezes  Gastrointestinal: Normoactive bowel sounds. Abdomen soft, tender and very mildly distended   Skin: jaundice with excoriations   Neurological: grossly normal    LAB DATA:         Lab results: 01/02/20  0045 01/01/20  0022 12/31/19  1219 12/31/19  0011 12/30/19  1627   WBC 12.4* 9.4* 9.6* 9.8* 8.5   Hemoglobin 6.6* 6.8* 7.7* 7.4* 7.1*   Hematocrit 21* 21* 24* 23* 21*   RBC  2.1* 2.2* 2.5* 2.4* 2.3*   Platelets 290 267 275 297 266           Lab results: 01/02/20  0045 01/01/20  0022 12/31/19  0011 12/30/19  0137 12/29/19  0440   Sodium 136 134 135 139 136   Potassium 3.7 3.7 3.7 3.8 3.6   Chloride 102 101 101 103 101   CO2 _1 UN _2 Creatinine 0.68 0.62* 0.66* 0.65* 0.64*   GFR,Caucasian 117 121 118 119 120   GFR,Black 135 140 137 138 138   Glucose 135* 106* 100* 96 109*   Calcium 8.1* 8.3* 8.3* 8.3* 8.5*   Total Protein 4.7* 4.7* 4.9* 4.9* 4.8*   Albumin 2.4* 2.4* 2.5* 2.5* 2.6*   ALT 168* 151* 137* 120* 105*   AST 326* 294* 288* 236* 208*   Alk Phos 1,370* 1,152* 1,031* 903* 792*   Bilirubin,Total 21.8* 21.8* 23.6* 23.9* 24.2*           Lab results: 01/02/20  0045   INR  1.3*     MELD-Na score: 22 at 01/02/2020 12:45 AM  MELD score: 21 at 01/02/2020 12:45 AM  Calculated from:  Serum Creatinine: 0.68 mg/dL (Rounded to 1 mg/dL) at 01/02/2020 12:45 AM  Serum Sodium: 136 mmol/L at 01/02/2020 12:45 AM  Total Bilirubin: 21.8 mg/dL at 01/02/2020 12:45 AM  INR(ratio): 1.3 at 01/02/2020 12:45 AM  Age: 44 years 2 months      IMAGING:   US Doppler Vein Left Lower Extremity    Result Date: 01/02/2020  1. Complete occlusive clot involving peroneal vein and posterior tibial vein. The clot is in close proximity to popliteal vein. 2. Small right Baker's cyst. 3. Thrombosed superficial vein seen posterior to popliteal vessels. Findings were discussed with Dr. Yves Dill at 9:02 AM.  END OF IMPRESSION    ASSESSMENT/RECOMMENDATIONS:   44 yo man with PMHx [place] and UC s/p total colectomy w/ J pouch creation in 2010 who was transferred from OSH for further management of obstructive jaundice/worsening liver disease. Presented at OSH with complaints of increasing abd pain, jaundice, and darkened urine. His OSH course was notable for removal of previously placed CBD stent (12/04/19 as an outpatient) and was c/b new onset DVT/PE and he was started on a heparin drip. Hospital course here notable for GI  bleed and concerning ill-defined heterogenous attenuation of right hepatic lobe on CT scan.     [place] now with new ill-defined heterogenous attenuation of right hepatic lobe-   MELD-Na score: 22 at 01/02/2020 12:45 AM  MELD score: 21 at 01/02/2020 12:45 AM  Calculated from:  Serum Creatinine: 0.68 mg/dL (Rounded to 1 mg/dL) at 01/02/2020 12:45 AM  Serum Sodium: 136 mmol/L at 01/02/2020 12:45 AM  Total Bilirubin: 21.8 mg/dL at 01/02/2020 12:45 AM  INR(ratio): 1.3 at 01/02/2020 12:45 AM  Age: 44 years 2 months   - Blood group O  - Patient was listed for transplant but now is on internal hold given concerns for infiltrative cholangiocarcinoma  - Appreciate Medical Oncology's involvement. Likely with limited chemotherapy options given elevated bilirubin. Discussed imaging with advanced endoscopist, and limited utility in ERCP for stenting. We will arrange for outpatient follow-up and liver Bx.   - Appreciate Palliative Care involvement and patient is comfort-oriented and now DNR/DNI. We will arrange for outpatient follow-up if able to go home. Palliative care asking for hospice consult.   - The patient would like to meet with Palliative again for guidance and GOC discussion and . Will speak with the Palliative Team.      GI bleed:  - Continues to bleed with hydrocortisone suppositories and Cipro. We will changed suppository to foam and repeated pouchoscopy 1/29.  Found one small actively oozing ulcer in the pouch, treated with epinephrine and bicap cautery.   - CT angio without bleed, Tagged RBC with blush at left flank - small bowel bleed.   - Holding anticoagulation in setting of bleed  - Transfuse for hemoglobin < 7 or hematocrit < 21  - Continue PPI BID  - Obtaining TEG   - Discussed options for pouchitis thought to be venous bleeding again with IR they will review with attending Dr. Georges Lynch  - Ddx also includes ectopic varices in the small bowel, low dose nadolol 40m daily started on 2/1. If he has good response, will  increase to 463mdaily. Will also consider the addition of octreotide injection if bleeding persist.    Concern for cholangitis:  - [facility] He had a stent placed, and based on telephone notes, then became jaundiced 12/12/2019 at which point he went back to [place]. There, he had biliary stent removed. His most recent MR/MRCP at [facility] on 12/22/2019 shows intrahepatic duct beading, without main duct narrowing. Reviewed the imaging with advanced endoscopist, and the utility of stent in addressing hyperbilirubinemia is low. Will not plan for repeat ERCP here  - We stopped empiric Zosyn (was started at admission) because blood cultures are NGTD     DVT/PE-   - Patient had bloody BMs, and heparin drip on hold  - Previously had discussed with patient the short and long term risk/benefits for anticoagulation. We decided together that we'll wait for bleed to resolve, and as an outpatient will discuss starting anticoagulation or not. Depending on our discussions with the patient, he may lean towards comfort measures only and no anticoagulation as outpatient. Otherwise, as noted before, he has no PCP and if we were to place him on warfarin, it may be more difficult to titrate this medication for him. With apixaban, the patient is Marcello Moores C, and apixaban is not recommended in use of such patients. Lovenox might be the best option for him.  - US doppler today: showed Complete occlusive clot involving peroneal vein and posterior tibial vein. The clot is in close proximity to popliteal vein. Small right Baker's cyst. Thrombosed superficial vein seen posterior to popliteal vessels.  - Will consult Hem[facility] for [facility] recommendations in the setting of PE/DVT/ plus GIB    Abdominal pain:  - Currently on tylenol 653m every 8 hours   - Continue gabapentin 3065mat night (given his symptoms are most pronounced at night)   - Continue Palliative Care recommendations     Itching:  - low  dose atarax 1095mID helped but patient is still experiencing pruritus. Will increase to 12m38mD.   - start prosource BID    Depression / Anxiety   - continue escitalopram 10mg22mly as recommended by Palliative team  - Palliative Care recommendations appreciated    Malnutrition  - Nutrition consult appreciated  - continue ensure BID  - start prosource BID    UC s/p total colectomy w/ Jpouch creation 2010  - continue home mesalamine and started Cipro and hydrocortisone as above    F: PO    E:daily labs, replete PRN  N:Regular with supplements    DVT ppx:IYSA:YTKZwel reg:cSWF:UXNATFTDDUKl add miralax once daily for now  PUD ppx: PPI BID    Dispo: pending clinical course     AnjanKristen Cardinal   Patient was reviewed at table rounds with transplant surgery and further reviewed on rounds.I saw and evaluated the patient. I agree with the resident's/fellow's findings and plan of care as documented above.      Open notes: Please note all efforts are made to assure accuracy of this note. Mistakes can be made as we do not have scribes and real time note taking. Information detailed in the note was taken from direct patient interviews as well as chart review.            Dr. MarieOrie FishermanyeGala MurdochCM, FRCPCFredericksburg Med, GI)  Hepatologist, Solid Organ Transplant Program  Associate Professor of [facility]

## 2020-01-02 NOTE — Progress Notes (Addendum)
Called and spoke to patient about hospice referral. Patient still reluctant but did agree to Probation officer making home hospice referral. [facility] to make referral [phone]. Spoke to [place] in intake. She is concerned that he has no PCP as they do need a provider involved in care. Collie Siad asked that chart notes, advanced directives, recent labs, and scans be sent to them at [phone]. Faxed facesheet, recent GI note, palliative notes to agency. SW faxed MOLST form as well. Plan is to touch base with Collie Siad first thing in the am about plan for d/c. Collie Siad aware patient may go home as early as tomorrow.    Noberto Retort, RN-BC  Acute Care Coordinator [phone]  Phone # [phone]  Pager # [phone]  Weekend Pager # [place], RN-BC  Acute Care Coordinator [phone]  Phone # [phone]  Pager # [phone]  Weekend Pager # [phone]

## 2020-01-02 NOTE — Progress Notes (Signed)
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    Significant 24h Events:  Delays in discharge planning and Hepatology team inquiring further conversations with [place] with patient today.     Subjective: Attempted to see patient earlier in day but he was unavailable. I returned later in the day with [facility] Hepatology NP. Radley was lying in bed fatigued appearing and mildly frustrated about communication/dispo planning.     Palliative Care ROS: Abdominal pain controlled with current meds. Continues with fatigue and bloody BMs. Lack of appetite.     Patient Active Problem List   Diagnosis Code    PSC (primary sclerosing cholangitis) K83.01    Primary sclerosing cholangitis K83.01       Allergies   Allergen Reactions    Adhesive Tape Rash    Sulfa Antibiotics Rash     Rash on legs        Scheduled Meds:    hydrOXYzine HCl  10 mg Oral TID    nadolol  20 mg Oral Daily    escitalopram  10 mg Oral Daily    docusate sodium  100 mg Oral 2 times per day    hydrocortisone-pramoxine  1 applicator Rectal 2 times per day    simethicone  80 mg Oral 4x Daily    mesalamine  4.8 g Oral Daily with breakfast    lidocaine  1 patch Transdermal Q24H    ciprofloxacin  500 mg Oral 2 times per day    pantoprazole  40 mg Intravenous Q12H    acetaminophen  650 mg Oral BID       Continuous Infusions: n/a      PRN Meds:  HYDROmorphone **OR** HYDROmorphone, HYDROmorphone, LORazepam, sodium chloride, dextrose, bisacodyl, haloperidol lactate    Past 24h PRN Usage:  - Dilaudid 2 mg po x 3  - Dilaudid 4 mg po x 2      Physical  Examination:   BP: (98-109)/(62-71)   Temp:  [36.6 C (97.9 F)-37.8 C (100 F)]   Temp src: Oral (02/02 0430)  Heart Rate:  [85-94]   Resp:  [14-18]   SpO2:  [98 %-100 %]   General appearance: Cachectic frail jaundiced young male lying in bed.  Head: atraumatic but with temporal wasting ans icteric sclera  Lungs: CTA, respirations easy RRR. Mediport  Heart: mild tachy  Abdomen: sl distended, mild tender  Extremities: warm, dry, left arm HL  Neurologic: A+ O x 3. Mood more somber today.      Family Meeting Note     Start Time: 1 pm End Time:  1:50    Patient Present: [ x]Yes [ ] No  If No, place an "x" next to all that apply   [ ]  lacks capacity to participate (ie intubated, sedated, comatose)  [ ]  severity of illness or symptoms distress   [ ]  clinical judgment not to include patient at this time   [ ]  other (explain) ____________     Staff Present (name/title): Kenna Gilbert NP Hepatology, myself     Family members present: domestic partner [name] and his aunt.   Surrogate Decision Maker present [x ]  Purpose : (place an "x" next to all that apply)   [ x] information sharing [x ] goal setting [x]  end of life planning [ ]  f/u to prior meeting [ x] discharge planning [ ]  other (explain) ____________     Brief Summary of Conference (i.e. diagnosis/condition, prognosis, goals of treatment, patient/family wishes): We talked about his hospital course and challenges with treatment options secondary to significant thrombus and ongoing GI bleed so inability to anticoagulate. We talked more about difference between Palliative care and Hospice including which treatments and services could be provided with that service. He had initially talked about going home with Palliative services continuing to monitor labs and receive transfusion. His aunt has had personal experiences with hospice care of family members and was able to shed light on his serious complications and possible complications.     Outcomes/  Conclusions: [place] is now wishing for a true comfort approach to care forgoing labs, transfusions and focusing on measures that will add quality to his time remaining. There are delays in dispo with out of county hospice and he agreed to come to [facility] while we helped facilitate his discharge so he could have a favorable environment and [name] could stay with him.     Follow up: New MOLST completed stopped labs and necessary medications/treatments.     Goals of care/Advance care planning    Primary Goals: Comfort  Decision-maker(s): patient    Current code status: DNI/DNR    Is MOLST present? Yes, new MOLST completed     Living will:  yes, form in chart    HCP:  yes, formal HCP completed  Name of HCP/Surrogate (if known): [name] (domestic partner)    Any important changes not captured above:      Total time 45 min spent on GOC/ACP         >50% of time was spent in counseling and/or coordination of care.    Durham Grosser [facility], NP  01/02/2020 5:52 PM    Assessment/Plan:  [name] is a 44 yo male with UC and liver disease secondary to primary sclerosing cholangitis and now suspected cholangiocarcinoma.     Pain/Dyspnea  - acetaminophen 650 mg po bid  - proctofoam-HC bid   - Lidoderm patch every day  - simethicone 80 mg po qid      Anxiety/Agitation/nausea/itching  - Haldol 0.5 mg  q 3 hrs prn nausea and first line aggitation  - ativan 0.5 mg po q 4 hrs prn anxiety  - atarax 10 mg po tid    Elimination/UC/LGIB/Constipation prevention, last BM 2/2  - off bowel meds per GI  - Mesalamine 4.8 G every day  - PPI bid    GOC/Prognosis: Now DNR/DNI/CMO. Hospice referral made to out of county and will transfer to [facility] while awaiting dispo. Social work in both [facility] and [facility] updated. Prognosis  < 2 weeks or less if continues to GIB.    [name] ANP  Palliative Care Service  [facility] [phone]  01/02/2020 8:56 AM

## 2020-01-02 NOTE — Discharge Summary (Addendum)
Name: Kyle Bright MRN: [id number] DOB: [birth year]     Admit Date: 12/20/2019   Date of Discharge: 01/02/2020     Patient was accepted for discharge to   [facility] [[id number]]       Discharge Attending Physician: Otilio Carpen, MD    Hospitalization Summary    CONCISE NARRATIVE:   [name] is a 44 yo M with PMH [place] and UC s/p total colectomy w/ J pouch creation in 2010 who was transferred from OSH for management of obstructive jaundice/worsening liver disease. was C/O worsening abd pain, jaundice, and darkened urine. The OSH course was notable for removal of previously placed CBD stent (12/04/19 as an outpatient).  C/b new onset DVT/PE and he was started on a heparin drip, ileus and ill-defined heterogenous attenuation of right hepatic lobe on CT scan.       GI bleed: continued bleeding with hydrocortisone suppositories and Cipro. Suppository was changed to foam and repeated pouchoscopy 1/29.  Found one small actively oozing ulcer in the pouch, treated with epinephrine and bicap cautery. 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 He has had a good response and it could be considered to increase to 40mg  daily if oozing continues.    DVT/PE- hx of DVT/PE from OSH, started on heparin gtt but was held d/t GIB. Occlusive clot LLE noted on [place] on 01/02/20, hematology consulted for risk vs benefit recommendations - currently not on any anticoagulation.     Concern for cholangitis: ERCP done on 12/04/19 with stent placement. Patient developed jaundiced afterwards, so the biliary stent was then removed. MR/MRCP completed on 12/22/2019 shows intrahepatic duct beading, without main duct narrowing. Reviewed the imaging with advanced endoscopist, and the utility of stent in addressing hyperbilirubinemia is low and it was decided not to repeat ERCP while inpatient.  Patient is currently listed for liver transplant but now is on internal hold  given concerns for infiltrative cholangiocarcinoma. He was seen by Medical Oncology who deemed chemotherapy, radiation, and surgery are not viable options given his liver disease. Palliative Care consulted for goals of care and comfort management. During goals of care discussion with patient and family, it was decided patient would prefer to spend his time with his family and does not want to continue in the hospital alone for the remainder of his life. He would like to pursue hospice/comfort care. Hospice referral has been made but he does not have a PCP and needs a physician to manage his hospice medications. He will be transferred to Palliative Care Inpatient Unit while discharge services are arranged.     CARDIAC TESTING:   12/22/19 Echocardiogram:  Normal LV mass and volumes but indeterminate diastolic function.   Hyperdynamic calculated resting LVEF (77%) without significant wall motion abnormalities.   No significant valvular abnormalities.    Grossly normal RV size and function but indeterminate pulmonary artery pressures.    ENDOSCOPY:   12/27/2019 EGD: Findings:     Esophagus:  ? Normal esophagus without varices, erosions or ulcers      Stomach:  ? No old or fresh blood upon entry  ? Mild PHG without active bleed  ? No ulcers or erosions  ? No gastric varices       Duodenum/small bowel:   No old or fresh blood with clear yellow effluent into D3   No ulcers or erosions    Intervention(s):   None  Complication(s):   none    EBL: 0 ml     OTHER PROCEDURES/FINDINGS:   12/27/2019 Pouch exam: The gastroscope was inserted into the pouch, and there was old blood and blood clot. This was washed extensively and all clot was attempted to be removed with suctioning and a Roth net. There was mild friability of the mucosa with scattered small erosions. At the site of an actively oozing area, 3cc of 1:100:000 epinephrine was injected. The scope was then inserted further. At the pouch inlet, there was normal  mucosa with mild friability but no ulcers or erosions. The efferent limb into the J tip was examined with normal mucosa. The afferent limb was intubated to 60cm with yellow/dark brown effluent and normal mucosa.     Impression(s):    All old blood and clot were collected in the pouch, which was mostly cleared.    Friable mucosa and scattered erosions in the pouch. Epinephrine was injected at areas of active oozing. 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 No ulcers or erosions at the pouch inlet or neo-terminal ileum.     Recommendation(s):   Will start oral cipro 500mg  BID and see if with clinical improvement in the next few days. Also will start rectal hydrocortisone 25mg  once daily   Can restart clear liquid diet and advance as tolerated   Continue to monitor CBC BID and transfuse if hemoglobin < 7 or hematocrit < 21   Okay to stop pantoprazole    12/28/19 - IR percutaneous liver biopsy    CT RESULTS:   12/22/2019 CT abdomen and pelvis with IV contrast:   1. Ill-defined, heterogeneous attenuation involving the right hepatic lobe, with focal overlying capsular retraction. This is concerning for possible cholangiocarcinoma, with abscess and metastasis less likely.  2. Sequela of known biliary PSC. Unchanged mixed density lesion in the left hepatic lobe.  3. Soft tissue thickening in the upper retroperitoneum, likely confluent adenopathy.  4. Splenomegaly of unclear etiology though this may related to developing portal hypertension.  5. Multiple focally dilated small bowel loops, likely representing developing ileus.  6. Distended gallbladder.  7. Sequela of fluid overload, with small right pleural effusion, moderate intra-abdominal ascites, and moderate periportal edema.    12/28/19 - CT angio abdomen and pelvis  1. No acute GI bleed is visualized on CT exam.  2. No significant change in ill-defined heterogeneous liver lesions with overlying capsular retraction. This is concerning for cholangiocarcinoma with abscess and  metastasis considered less likely.  3. Sequela of known biliary PSC.  4. Soft tissue thickening within the upper retroperitoneum and throughout the mesentery, likely confluent adenopathy.  5. Unchanged mild splenomegaly.  6. Interval increase in gallbladder distention.  7. Slight interval decrease in the extent of the small bowel wall dilatation, which may be due to an evolving ileus, likely within a small bowel obstruction. This can be followed on further imaging, including plain films.2  8. Sequela of fluid overload including small bilateral pleural effusions, body wall anasarca, and intra-abdominal ascites.          MRI RESULTS:   12/22/2019 MRI abdomen without contrast and MRCP with 3 d render: Suboptimal evaluation due to patient's difficulty with complete the study due to pain. The patient may return for the remaining images when it is clinically appropriate.    1. Distended small bowel loops with small amount of ascites, incompletely evaluated on this exam. This may represent small bowel obstruction or ileus.    Recommend further evaluation with CT  abdomen and pelvis.    2. Sequela of known PSC, grossly similar to prior exams.    3. Redemonstration of the segment 2 cystic lesion with layering debris, grossly stable since prior exams.    Findings discussed with the [facility] by Dr. Kirke Shaggy of radiology as documented in the primordial system.        ULTRASOUND RESULTS:   01/02/20 - US doppler vein LLE  1. Complete occlusive clot involving peroneal vein and posterior tibial vein. The clot is in close proximity to popliteal vein.  2. Small right Baker's cyst.  3. Thrombosed superficial vein seen posterior to popliteal vessels.      XRAY RESULTS:   12/24/2019 KUB: Variably mildly distended segments of bowel in the central abdomen, some are likely small bowel segments and others could be colonic segments.   This pattern could be due to an evolving ileus, follow-up radiographs could be performed to further assess  as needed.  Evidence of ascites again seen.                CONSULTANT SERVICE     Physical Therapy     Nutrition     Hospice and Palliative Medicine           Signed: Arther Dames, NP  On: 01/02/2020  at: 4:47 PM

## 2020-01-03 ENCOUNTER — Other Ambulatory Visit: Payer: Self-pay

## 2020-01-03 DIAGNOSIS — M545 Low back pain: Secondary | ICD-10-CM

## 2020-01-03 MED ORDER — POLYETHYLENE GLYCOL 3350 PO POWD *I*
17.0000 g | Freq: Every day | ORAL | 0 refills | Status: AC
Start: 2020-01-04 — End: 2020-01-18
  Filled 2020-01-03: qty 238, 14d supply, fill #0

## 2020-01-03 MED ORDER — HYOSCYAMINE SULFATE 0.125 MG PO TABS *I*
0.1250 mg | ORAL_TABLET | ORAL | 0 refills | Status: AC | PRN
Start: 2020-01-03 — End: 2020-01-17
  Filled 2020-01-03: qty 10, 2d supply, fill #0

## 2020-01-03 MED ORDER — HYDROMORPHONE HCL 2 MG PO TABS *I*
ORAL_TABLET | ORAL | 0 refills | Status: AC
Start: 2020-01-03 — End: 2020-01-17
  Filled 2020-01-03: qty 100, 7d supply, fill #0

## 2020-01-03 MED ORDER — PANTOPRAZOLE SODIUM 40 MG PO TBEC *I*
40.0000 mg | DELAYED_RELEASE_TABLET | Freq: Two times a day (BID) | ORAL | 0 refills | Status: AC
Start: 2020-01-03 — End: 2020-02-02

## 2020-01-03 MED ORDER — NADOLOL 20 MG PO TABS *I*
20.0000 mg | ORAL_TABLET | Freq: Every day | ORAL | Status: AC
Start: 2020-01-04 — End: ?

## 2020-01-03 MED ORDER — HALOPERIDOL LACTATE 5 MG/ML IJ SOLN *I*
0.5000 mg | INTRAMUSCULAR | 0 refills | Status: DC | PRN
Start: 2020-01-03 — End: 2020-01-03
  Filled 2020-01-03: qty 1, 2d supply, fill #0

## 2020-01-03 MED ORDER — SENNOSIDES 8.6 MG PO TABS *I*
1.0000 | ORAL_TABLET | Freq: Every day | ORAL | Status: DC
Start: 2020-01-03 — End: 2020-01-03
  Administered 2020-01-03: 1 via ORAL
  Filled 2020-01-03: qty 1

## 2020-01-03 MED ORDER — LORAZEPAM 1 MG PO TABS *I*
0.5000 mg | ORAL_TABLET | ORAL | 0 refills | Status: AC | PRN
Start: 2020-01-03 — End: 2020-01-17
  Filled 2020-01-03: qty 10, 4d supply, fill #0

## 2020-01-03 MED ORDER — DOCUSATE SODIUM 100 MG PO CAPS *I*
100.0000 mg | ORAL_CAPSULE | Freq: Two times a day (BID) | ORAL | 0 refills | Status: AC
Start: 2020-01-03 — End: 2020-01-18
  Filled 2020-01-03: qty 28, 14d supply, fill #0

## 2020-01-03 MED ORDER — BISACODYL 10 MG RE SUPP *I*
10.0000 mg | Freq: Every day | RECTAL | 0 refills | Status: AC | PRN
Start: 2020-01-03 — End: 2020-02-02
  Filled 2020-01-03: qty 12, 12d supply, fill #0

## 2020-01-03 MED ORDER — HYDROCORTISONE ACE-PRAMOXINE 1-1 % RE FOAM *I*
1.0000 | Freq: Two times a day (BID) | CUTANEOUS | 0 refills | Status: AC
Start: 2020-01-03 — End: 2020-01-17
  Filled 2020-01-03: qty 10, 3d supply, fill #0

## 2020-01-03 MED ORDER — HYDROXYZINE HCL 10 MG PO TABS *I*
10.0000 mg | ORAL_TABLET | Freq: Three times a day (TID) | ORAL | 0 refills | Status: AC | PRN
Start: 2020-01-03 — End: 2020-01-17
  Filled 2020-01-03: qty 10, 4d supply, fill #0

## 2020-01-03 MED ORDER — SENNOSIDES 8.6 MG PO TABS *I*
1.0000 | ORAL_TABLET | Freq: Two times a day (BID) | ORAL | Status: DC
Start: 2020-01-03 — End: 2020-01-05
  Administered 2020-01-03 – 2020-01-05 (×4): 1 via ORAL
  Filled 2020-01-03 (×4): qty 1

## 2020-01-03 MED ORDER — SENNOSIDES 8.6 MG PO TABS *I*
1.0000 | ORAL_TABLET | Freq: Two times a day (BID) | ORAL | 0 refills | Status: AC
Start: 2020-01-03 — End: 2020-01-18
  Filled 2020-01-03: qty 28, 14d supply, fill #0

## 2020-01-03 MED ORDER — MESALAMINE 1.2 GM PO TBEC *I*
4.8000 g | DELAYED_RELEASE_TABLET | Freq: Every day | ORAL | Status: DC
Start: 2020-01-04 — End: 2020-01-04

## 2020-01-03 MED ORDER — ESCITALOPRAM OXALATE 10 MG PO TABS *I*
10.0000 mg | ORAL_TABLET | Freq: Every day | ORAL | 0 refills | Status: AC
Start: 2020-01-04 — End: 2020-01-18
  Filled 2020-01-03: qty 14, 14d supply, fill #0

## 2020-01-03 MED ORDER — HALOPERIDOL LACTATE 2 MG/ML PO CONCENTRATED *I*
1.0000 mg | ORAL | 0 refills | Status: AC | PRN
Start: 2020-01-03 — End: 2020-01-17
  Filled 2020-01-03: qty 15, 5d supply, fill #0

## 2020-01-03 MED ORDER — CIPROFLOXACIN HCL 500 MG PO TABS *I*
500.0000 mg | ORAL_TABLET | Freq: Two times a day (BID) | ORAL | 0 refills | Status: AC
Start: 2020-01-03 — End: 2020-01-18
  Filled 2020-01-03: qty 28, 14d supply, fill #0

## 2020-01-03 MED ORDER — ACETAMINOPHEN 650 MG RE SUPP *I*
650.0000 mg | RECTAL | 0 refills | Status: AC | PRN
Start: 2020-01-03 — End: 2020-02-02
  Filled 2020-01-03: qty 12, 2d supply, fill #0

## 2020-01-03 MED ORDER — HYDROXYZINE HCL 10 MG PO TABS *I*
10.0000 mg | ORAL_TABLET | Freq: Three times a day (TID) | ORAL | Status: DC | PRN
Start: 2020-01-03 — End: 2020-01-05
  Administered 2020-01-05: 10 mg via ORAL
  Filled 2020-01-03 (×3): qty 1

## 2020-01-03 MED ORDER — SIMETHICONE 80 MG PO CHEW *I*
80.0000 mg | CHEWABLE_TABLET | Freq: Four times a day (QID) | ORAL | 0 refills | Status: AC
Start: 2020-01-03 — End: 2020-01-29
  Filled 2020-01-03: qty 100, 25d supply, fill #0

## 2020-01-03 MED ORDER — LIDOCAINE 5 % EX PTCH *I*
1.0000 | MEDICATED_PATCH | CUTANEOUS | 0 refills | Status: AC
Start: 2020-01-03 — End: 2020-01-18
  Filled 2020-01-03: qty 14, 14d supply, fill #0

## 2020-01-03 NOTE — Comprehensive Assessment (Signed)
Palliative Care Social Work Initial Assessment    Writer's contact with patient was via face-to-face PPE used: mask, gloves, and eye protection.   Was patient masked during visit: no. Family masked: yes      Met with the pt and his fianc, Kyle Bright. Discussed plan for home with hospice services in [place].  We planned to continue conversations on an ongoing basis and plan for a home d/c by tomorrow if possible.     ACC updated regarding Hospice Referral.     Demographics:  Religious Beliefs: None     [place] of Residence: Other (Comment)([place])     Ethnicity/Race: Caucasian    Risk Factors:  Risk Factors: End of life       Advance Directive:  *Has patient (or family) completed any of the following? (select all that apply): MOLST, Health Care Proxy (HCP)  MOLST available for inclusion in the chart?: Yes  Does MOLST designate: DNR, DNI  MOLST in chart?: Yes        HCP Name: Kyle Bright  HCP Phone Number: [phone]  HCP available for inclusion in the chart?: Yes  HCP in chart: Yes                    *Would they like to discuss any issues related to MOLST, DNR Order, HCP, Living Will, or POA?: No        *Health Care Directive teaching done: No    Contacts/Support System:   ,  ,     ,  ,     ,  ,      Living Situation:  Lives With: Significant other       Psychosocial:  Coping Status: With some difficulty  Emotional Impression: Other (comment)(Stoic)  Current Goal of Care: Comfort  Preferences Regarding Death: Family Presence (comment)     Anticipatory Grief / Bereavement Risk: High risk    Income Information:  Vocational: Part time employment(& working on PhD)  Income Situation: Salary/wages     Insurance Information: Airline pilot  Prescription Coverage: and has     Served in [place] military: No                 [place], [place]   11:50 AM on 01/03/2020  PIC [id number]

## 2020-01-03 NOTE — Progress Notes (Signed)
Writer received request from [place] for hospice referral follow up. Writer met with patient and his fiance, [name], and they are agreeable with hospice care. Collie Siad from Intake from [facility]) contacted [phone], and she accepted referral. She will call patient and [name] to do Intake interview.   Patient does not have a PCP, but writer left message for Dr. Trinna Post from [facility]Oncology, who Collie Siad states usually agrees to sign their hospice orders.    Address and phone number confirmed as correct with patient. Patient's apartment building is handicap accessible, and has elevator to 7th floor that can accommodate a stretcher.     Hospice nurse will evaluate for DME in the home. Patient is not using oxygen at this time.    Transportation scheduled by Melissa for 01/04/20 at 2:30pm by ambulance, and Collie Siad from hospice is aware. Writer requested call back to confirm first visit date/time.    Wallace Going, RN, BSN  Acute Care Coordinator Nurse Leader/Educator/Preceptor  [phone]; pager [phone]  Weekend covering Coordinator pager [phone]

## 2020-01-03 NOTE — Progress Notes (Signed)
NPN 1500-2300: Kyle Bright was admitted to 41200 around 1900 from [id number]. Arrived via wheelchair, accompanied by Kyle Bright and S.O. Kyle Bright. Oriented to room. Kyle Bright is Alert and oriented x3 and able to get up ad lib. Does have abd  and back pain. Taking prn po Dilaudid when needed. Took 4mg  dose prior to sleeping for 6/10 back pain with positive response. Kyle Bright is sleeping at bedside. Kyle Bright's hope is to get home hospice. He stated mom and brother are driving in from [place] and should be here in 2 days. Next shift RN to continue monitoring. RN aware.

## 2020-01-03 NOTE — Progress Notes (Signed)
[facility] SOCIAL WORK  PHARMACY FORM     Todays date:  January 03, 2020    Patient Name: Kyle Bright      Medical Record #: [id number]   DOB: [birth year]  Patients Address: [address] [place] Worker: Wynetta Emery, LMSW       Date of Service: January 03, 2020       Funding Source: [place]  ___________________________________________________________________    Pharmacy Information:  Date/time sent: January 03, 2020     Time needed: 01/04/20 10AM    Patient Location: Inpatient (specify unit)  [phone]    Medication Pick-up Preference: Have Discharge Pharmacy Team bring to unit     Pharmacy Contact: [place] work signature: Wynetta Emery, LMSW  Supervisor/Manager Approval (if indicated):  Date: 01/03/2020  (supervisor signature not required for Medicaid pending)

## 2020-01-03 NOTE — Progress Notes (Signed)
Assumed care of patient 2300-0730. Q2hr pain and respirations assessed throughout shift. C/o 7/10 back pain. 4mg  Po dilaudid given X1 with relief and 2mg  given as well, reassessment on next shift. Writer offered heat or cold packs to assist with pain management, declined. Pt. C/o constipation, offered PRN suppository, declined and preferred something oral. Provider paged, given senakot. Pt. Ambulatory in room, s/o at bedside. Please refer to patient flowsheets and eMAR for further assessments and documentation.     Andree Moro, RN

## 2020-01-03 NOTE — Progress Notes (Signed)
Received message from [place] with MD Cligerman (?) from heme/onc in [place] while he was at [facility]. She was inquiring if we needed assistance with hospice referral or if we wanted the doctor to be patient's attending for hospice. She left call back [phone]. Paged unit SW with info.    Noberto Retort, RN-BC  Acute Care Coordinator [phone]  Phone # [phone]  Pager # [phone]  Weekend Pager # [phone]

## 2020-01-03 NOTE — Discharge Instructions (Signed)
Brief Summary of Your Hospital Course (including key procedures and diagnostic test results):  You were worked up for obstructive jaundice and found to have a new right liver lesion consistent with cancer (cholangiocarcinoma). You were on anticoagulation for a blood clot but this made your GI tract bleeding worse. You underwent a EGD which found a small bleeding ulcer which was cauterized. You continued to have bleeding and a tagged red scan showed some recurrent bleeding in your small bowel. Your anticoagulation needed to be held.     With your advanced degree there were no treatment options and you met with Palliative care to help with symptoms and discussions about goals of care. After much careful thought and group discussions. You decided on a comfort based approach and were referred to hospice in [place] for a home plan. A MOLST was completed for DNR/DNI/comfort measures. This was scanned into our computer and you were sent home with the original form and is a legal document to convey your wishes and will be honored by all providers. A new MOLST would need to be completed if you were to change your wishes for your care.        Eat and drink as desired and activity as tolerated. Use bowel meds to target daily bowel movement or soft consistency.       Medications provided for symptoms, take as directed.     Contact [facility] for any questions or uncontrolled symptoms as they will assist with an alternative place if your symptoms are not well controlled at home with recommended changes.

## 2020-01-03 NOTE — Discharge Summary (Signed)
Name: Kyle Bright MRN: [id number] DOB: Sep 13, 1976     Admit Date: 12/20/2019   Date of Discharge: 01/04/2020     Patient was accepted for discharge to   [facility] [6]           Discharge Attending Physician: Jenne Pane, MD      Hospitalization Summary    CONCISE NARRATIVE:   [name] is a 44 yo M with PMH [place] and UC s/p total colectomy w/ J pouch creation in 2010 who was transferred from OSH for management of obstructive jaundice/worsening liver disease. was C/O worsening abd pain, jaundice, and darkened urine. The OSH course was notable for removal of previously placed CBD stent (12/04/19 as an outpatient).  C/b new onset DVT/PE and he was started on a heparin drip, ileus and ill-defined heterogenous attenuation of right hepatic lobe on CT scan.       GI bleed: continued bleeding with hydrocortisone suppositories and Cipro. Suppository was changed to foam and repeated pouchoscopy 1/29.  Found one small actively oozing ulcer in the pouch, treated with epinephrine and bicap cautery. 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 He has had a good response and it could be considered to increase to 40mg  daily if oozing continues.    DVT/PE- hx of DVT/PE from OSH, started on heparin gtt but was held d/t GIB. Occlusive clot LLE noted on [place] on 01/02/20, hematology consulted for risk vs benefit recommendations - currently not on any anticoagulation.     Concern for cholangitis: ERCP done on 12/04/19 with stent placement. Patient developed jaundiced afterwards, so the biliary stent was then removed. MR/MRCP completed on 12/22/2019 shows intrahepatic duct beading, without main duct narrowing. Reviewed the imaging with advanced endoscopist, and the utility of stent in addressing hyperbilirubinemia is low and it was decided not to repeat ERCP while inpatient.  Patient is currently listed for liver transplant but now is on internal hold given  concerns for infiltrative cholangiocarcinoma. Palliative Care consulted for goals of care and comfort management. During goals of care discussion with patient and family, it was decided patient would prefer to spend his time with his family and does not want to continue in the hospital alone for the remainder of his life. He would like to pursue hospice/comfort care. Hospice referral has been made but he does not have a PCP and needs a physician to manage his hospice medications. He will be transferred to Palliative Care Inpatient Unit while discharge services are arranged. He was stable for discharge home on 2/4 with hospice enrollment after arrival at home.                    CARDIAC TESTING:   12/22/19 Echocardiogram:  Normal LV mass and volumes but indeterminate diastolic function.   Hyperdynamic calculated resting LVEF (77%) without significant wall motion abnormalities.   No significant valvular abnormalities.    Grossly normal RV size and function but indeterminate pulmonary artery pressures.    ENDOSCOPY:   12/27/2019 EGD: Findings:     Esophagus:  ? Normal esophagus without varices, erosions or ulcers      Stomach:  ? No old or fresh blood upon entry  ? Mild PHG without active bleed  ? No ulcers or erosions  ? No gastric varices       Duodenum/small bowel:   No old or fresh blood with clear yellow  effluent into D3   No ulcers or erosions    Intervention(s):   None    Complication(s):   none    EBL: 0 ml     OTHER PROCEDURES/FINDINGS:   12/27/2019 Pouch exam: The gastroscope was inserted into the pouch, and there was old blood and blood clot. This was washed extensively and all clot was attempted to be removed with suctioning and a Roth net. There was mild friability of the mucosa with scattered small erosions. At the site of an actively oozing area, 3cc of 1:100:000 epinephrine was injected. The scope was then inserted further. At the pouch inlet, there was normal mucosa with mild friability but  no ulcers or erosions. The efferent limb into the J tip was examined with normal mucosa. The afferent limb was intubated to 60cm with yellow/dark brown effluent and normal mucosa.     Impression(s):    All old blood and clot were collected in the pouch, which was mostly cleared.    Friable mucosa and scattered erosions in the pouch. Epinephrine was injected at areas of active oozing. 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 No ulcers or erosions at the pouch inlet or neo-terminal ileum.     Recommendation(s):   Will start oral cipro 500mg  BID and see if with clinical improvement in the next few days. Also will start rectal hydrocortisone 25mg  once daily   Can restart clear liquid diet and advance as tolerated   Continue to monitor CBC BID and transfuse if hemoglobin < 7 or hematocrit < 21   Okay to stop pantoprazole    12/28/19 - IR percutaneous liver biopsy    CT RESULTS:   12/22/2019 CT abdomen and pelvis with IV contrast:   1. Ill-defined, heterogeneous attenuation involving the right hepatic lobe, with focal overlying capsular retraction. This is concerning for possible cholangiocarcinoma, with abscess and metastasis less likely.  2. Sequela of known biliary PSC. Unchanged mixed density lesion in the left hepatic lobe.  3. Soft tissue thickening in the upper retroperitoneum, likely confluent adenopathy.  4. Splenomegaly of unclear etiology though this may related to developing portal hypertension.  5. Multiple focally dilated small bowel loops, likely representing developing ileus.  6. Distended gallbladder.  7. Sequela of fluid overload, with small right pleural effusion, moderate intra-abdominal ascites, and moderate periportal edema.    12/28/19 - CT angio abdomen and pelvis  1. No acute GI bleed is visualized on CT exam.  2. No significant change in ill-defined heterogeneous liver lesions with overlying capsular retraction. This is concerning for cholangiocarcinoma with abscess and metastasis considered less  likely.  3. Sequela of known biliary PSC.  4. Soft tissue thickening within the upper retroperitoneum and throughout the mesentery, likely confluent adenopathy.  5. Unchanged mild splenomegaly.  6. Interval increase in gallbladder distention.  7. Slight interval decrease in the extent of the small bowel wall dilatation, which may be due to an evolving ileus, likely within a small bowel obstruction. This can be followed on further imaging, including plain films.2  8. Sequela of fluid overload including small bilateral pleural effusions, body wall anasarca, and intra-abdominal ascites.          MRI RESULTS:   12/22/2019 MRI abdomen without contrast and MRCP with 3 d render: Suboptimal evaluation due to patient's difficulty with complete the study due to pain. The patient may return for the remaining images when it is clinically appropriate.    1. Distended small bowel loops with small amount of ascites, incompletely evaluated  on this exam. This may represent small bowel obstruction or ileus.    Recommend further evaluation with CT abdomen and pelvis.    2. Sequela of known PSC, grossly similar to prior exams.    3. Redemonstration of the segment 2 cystic lesion with layering debris, grossly stable since prior exams.    Findings discussed with the [facility] by Dr. Kirke Shaggy of radiology as documented in the primordial system.        ULTRASOUND RESULTS:   01/02/20 - US doppler vein LLE  1. Complete occlusive clot involving peroneal vein and posterior tibial vein. The clot is in close proximity to popliteal vein.  2. Small right Baker's cyst.  3. Thrombosed superficial vein seen posterior to popliteal vessels.      XRAY RESULTS:   12/24/2019 KUB: Variably mildly distended segments of bowel in the central abdomen, some are likely small bowel segments and others could be colonic segments.   This pattern could be due to an evolving ileus, follow-up radiographs could be performed to further assess as needed.  Evidence of  ascites again seen.                CONSULTANT SERVICE     Physical Therapy     Nutrition     Hospice and Palliative Medicine             Signed: [name], NP  On: 01/03/2020  at: 4:43 PM

## 2020-01-03 NOTE — Progress Notes (Addendum)
[facility]   Transportation Request Form / Physician Certification Statement     Patient Name:  Kyle Bright     Date of Birth:  07-Nov-1976   MRN: [id number]    Date of Service: 01/05/20 Requested Time of Pick up: 7 AM    Patient Location:  [facility], Unit  [phone]    Patient Destination:Home:   [address] [place] [address]    Number of steps into house?: 0 (no steps to enter and elevator to apartment)     Requestors Name: Wynetta Emery, Clutier Call Back Number: [phone]     Payor: SW Voucher vs Conservator, museum/gallery for (check what type of treatment or service, at least one):  Discharge    Specify what type of treatment or service: Discharge to Home     Is this treatment or service available at sending facility?:  no    Requested Mode of Transport: BLS Ambulance     Height: Height: 175.3 cm (5\' 9" )  Weight: Weight: 53.3 kg (117 lb 8 oz)   Round Trip/One Way: One Way    VENDOR: Ambulance [facility]: fax # [id number], phone # [phone]     1. Medical condition that necessitates this mode of transport (i.e. oxygen, bed ridden, etc.): Pt  Is total care for transfers but can make needs known. End stage liver disease with metastatic cancer - d/c home plan for Hospice care, prognosis 2 weeks or less.     2. What medical services are to be provided by crew?: None    3. Infection control needs (i.e. ORSA/VRE/Cdiff): no    4. What specific handling is required?: Positioning  Fall Precaution    5.  Patient mental status?: Normal Cognition    6. At time of transport is bed confinement ordered?: no        Is patient bed confined? no   Medical condition for bed confinement: no    Electronic Signature: Wynetta Emery, LMSW      Date:  01/03/20    Physical Signature: _______________________________________       Title: Discharge Planner

## 2020-01-03 NOTE — Progress Notes (Addendum)
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    Significant 24h Events:  Brought to 412 while awaiting coordination of home hospice in [place]. Now CMO.     Subjective: Kyle Bright sitting upright in bed. Complaints of back pain and constipation. Feels his medications and dosing are appropriate for his pain.      Palliative Care ROS:  Continues with fatigue and back/abdominal pain. Now issues with constipation.      Patient Active Problem List   Diagnosis Code    PSC (primary sclerosing cholangitis) K83.01    Primary sclerosing cholangitis K83.01       Allergies   Allergen Reactions    Adhesive Tape Rash    Sulfa Antibiotics Rash     Rash on legs        Scheduled Meds:    senna  1 tablet Oral Daily    hydrOXYzine HCl  20 mg Oral TID    polyethylene glycol  17 g Oral Daily    pantoprazole  40 mg Oral BID AC    nadolol  20 mg Oral Daily    escitalopram  10 mg Oral Daily    docusate sodium  100 mg Oral 2 times per day    hydrocortisone-pramoxine  1 applicator Rectal 2 times per day    simethicone  80 mg Oral 4x Daily    mesalamine  4.8 g Oral Daily with breakfast    lidocaine  1 patch Transdermal Q24H    ciprofloxacin  500 mg Oral 2 times per day    acetaminophen  650 mg Oral BID       Continuous Infusions: n/a      PRN Meds:  HYDROmorphone **OR** HYDROmorphone, HYDROmorphone, LORazepam, sodium chloride, dextrose, bisacodyl, haloperidol lactate    Past 24h PRN Usage:  - Dilaudid 2 mg po x 3  - Dilaudid 4 mg po x 2      Physical Examination:   BP: (95-96)/(58-61)   Temp:  [36.6 C (97.9 F)-37 C (98.6 F)]    Temp src: Oral (02/02 1643)  Heart Rate:  [76-78]   Resp:  [14-20]   SpO2:  [98 %-99 %]   General appearance: Cachectic frail jaundiced young male lying in bed.  Head: atraumatic but with temporal wasting ans icteric sclera  Lungs: CTA, respirations easy RRR. Mediport  Heart: mild tachy  Abdomen: sl distended, mild tender  Extremities: warm, dry, left arm HL  Neurologic: A+ O x 3. Mood more somber today.      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     Pain/Dyspnea  - acetaminophen 650 mg po bid  - proctofoam-HC bid   - Lidoderm patch every day  - simethicone 80 mg po qid      Anxiety/Agitation/nausea/itching  - Haldol 0.5 mg  q 3 hrs prn nausea and first line aggitation  - ativan 0.5 mg po q 4 hrs prn anxiety  - atarax 10 mg po tid prn    Elimination/UC/LGIB/Constipation prevention, last BM 2/2  - colace 100 mg bid, senna increase to  1 tabs po bid, add Miralax 17 gm po qd  - Mesalamine 4.8 G every day  - PPI bid    GOC/Prognosis: Now DNR/DNI/CMO. [place]. Will remain on 412 while awaiting dispo. Plan of care discussed with patient, attending, bedside nurse, social work, and hospice. Prognosis weeks or less if continues to GIB.      [name] ANP  Palliative Care Service  [facility] [phone]  01/03/2020 10:41 AM      Attending note: Patient seen and examined jointly by myself and  [name], NP.   I reviewed and edited her notes. Agree with the details in above notes.

## 2020-01-03 NOTE — Progress Notes (Signed)
Social Work Progress Note:     Armed forces operational officer with patient was via face-to-face PPE used: mask, gloves, and eye protection.   Was patient masked during visit: no. Family masked: Yes       Contacts: Patient, Acute Care Coordinator and Family    Intervention:   Updated the pt and his Ellene Route, [name] that transport is scheduled for tomorrow. However, we are still waiting on confirmation from the Hospice agency for a start date in the home.       Transportation:  BLS - [facility] - BLS Ambulance without [place] (vs SW voucher if not covered)      Plan:   Home d/c plan with hospice.    Marya Fossa, [place]  Pager [phone]

## 2020-01-03 NOTE — Progress Notes (Signed)
PHYSICAL THERAPY PROGRESS NOTE       01/03/20 [place]   Visit Number   Visit Number [facility]) / Treatment Day (HH) 2   Precautions/Observations   Was patient wearing a mask? Yes   PPE worn by Probation officer Gloves;Goggles;Mask   Fall Precautions General falls precautions   Other Pt has transferred to 412 for comfort oriented approach to care. SO present and supportive during session; she was wearing mask.   Pain Assessment   *Is the patient currently in pain? Yes   Pain (Before,During, After) Therapy Before   0-10 Scale   ("mild")   Pain Location Back   Pain Orientation Lower   Pain Intervention(s) Heat applied;Refer to nursing for pain management   Additional comments Pt reports better pain control on current meds.    Cognition   Arousal/Alertness Appropriate responses to stimuli   Following Commands Follows all commands and directions without difficulty   Bed Mobility   Bed mobility Not tested   Additional comments Mobility not formally assessed this visit as pt has been up ad lib in room. Focus of treatment to assist with management of back pain.    Therapeutic Exercises   Additional comments Pt given ex program last visit to assist with addressing back pain. Unclear if pt has been up to trying to work on these. Writer provided soft tissue massage this visit and pt's SO observed. Writer ed pt in techniques that may be helpful and best tolerated. Massage done in sidelying. Large hot pack provided to pt at end of session. Tol well and pt reported massage was helpful.    PT AM-PAC Mobility   Turning over in bed? 4   Sitting down on and standing up from a chair with arms? 4   Moving from lying on back to sitting on the side of the bed? 4   Moving to and from a bed to a chair? 4   Need to walk in hospital room? 4   Climbing 3 - 5 steps with a railing? 1   Total Raw Score 21   Standardized Score 50.25   CMS 1-100% Score 29   Medicare Functional Limit Modifiers CJ  20 - < 40% impaired, limited,  restricted   Assessment   Brief Assessment Remains appropriate for skilled therapy   Plan/Recommendation   Treatment Interventions Restorative PT   PT Frequency 1-2 x/wk   Mobility Recommendations amb ad lib on unit   Discharge Recommendations Anticipate return to prior living arrangement;Intermittent supervision/assist   PT Discharge Equipment Recommended None   Assessment/Recommendations Reviewed With: Patient;Nursing;Family   Next PT Visit Assess if home ex program remains approp; monitor mobility status in prep for home d/c plan.    Time Calculation   Total Time Therapeutic Activities (minutes) 0   Total Time Gait Training (minutes) 0   Total Time Therapeutic Exercises (minutes) 0   Total Time Neuromuscular Re-education (minutes) 12   Total Time Group Therapy (minutes) 0   PT Timed Codes 12   PT Untimed Codes 0   PT Unbilled Time 0   PT Total Treatment 12   Plan and Onset date   Plan of Care Date 12/30/19   Onset Date 12/20/19   Treatment Start Date 12/30/19   Pamala Hurry Ogechi Kuehnel,PT/2096

## 2020-01-03 NOTE — Progress Notes (Signed)
Report Given To  Nelia Shi, RN       Descriptive Sentence / Reason for Admission   Hx: 12/04/19 ERCP with CBD stent placed   Presented to [facility] ED in [place] with increasing abdominal pain, jaundice and darkened urine found to have imaging consistent with obstructive jaundice / concerns for acute cholangitis. Work-up was revealing for PE / non-occlusive DVT for which he was started on a heparin gtt. Bili increase and was transferred to [facility]  PMH: Anemia, Ulcerative Colitis, Liver disease, Osteoporosis, primary sclerosing cholangitis  A&OX3  Up ad lib      Active Issues / Relevant Events   NPN:0700-1930: Choyce started the day with some continued back & abd pain. Re'c his ATC tylenol and x2 4mg  of PRN dilaudid. Pt is A&Ox3 and very pleasant. Re'c his ATC meds per order. Pt continues to c/o feeling constipated but did state around 1800 that he was feeling like he may have a BM soon. Skin intact. Eating a good amount of his meals. S/o Jessica at the bedside providing support.           To Do List      Exception has been made for pt's wife to come visit 1/30 between the hours of 0800-2000      Anticipatory Guidance / Discharge Planning  TBD

## 2020-01-03 NOTE — Progress Notes (Signed)
Sw aware of patient's transfer to 41200. Sign out provided to unit SW, [place] LMSW.    Maryan Char, LMSW  WifiXM:[id number]; Office: [phone]

## 2020-01-03 NOTE — Interdisciplinary Rounds (Addendum)
Palliative Care Interdisciplinary Rounds Note:     Date: 01/03/2020      Disciplines present: NP, RN, SW, Attending, Resident, Hospice RN, Fellow,   Prognosis: less than two weeks  Dispo: home plan  Symptom Management: Pt expressing back pain, tx with PO dilaudid, positive effect noted. A&O and expressed wished to d/c home.   Spiritual Needs: Chaplain to assess as needed.   Hospice (if appropriate): [facility] has referred to [place] in [facility].       _________________________________        Date: 01/04/2020       Disciplines present:  RN, SW, NP, Attending, Fellow, Hospice RN, Resident,   Updates: No significant changes. Social Work to provide ongoing support to patient and family. See provider note for details of symptom management plan. D/C home tomorrow AM.       _________________________

## 2020-01-04 ENCOUNTER — Other Ambulatory Visit: Payer: Self-pay

## 2020-01-04 MED ORDER — GABAPENTIN 100 MG PO CAPSULE *I*
100.0000 mg | ORAL_CAPSULE | Freq: Every evening | ORAL | 0 refills | Status: AC
Start: 2020-01-04 — End: 2020-02-03
  Filled 2020-01-04: qty 30, 30d supply, fill #0

## 2020-01-04 MED ORDER — GABAPENTIN 100 MG PO CAPSULE *I*
100.0000 mg | ORAL_CAPSULE | Freq: Every evening | ORAL | Status: DC
Start: 2020-01-04 — End: 2020-01-05
  Administered 2020-01-04: 100 mg via ORAL
  Filled 2020-01-04: qty 1

## 2020-01-04 NOTE — Discharge Summary (Addendum)
Name: Kyle Bright MRN: [id number] DOB: 1976/04/25     Admit Date: 12/20/2019   Date of Discharge: 01/04/2020     Patient was accepted for discharge to   [facility] [6]           Discharge Attending Physician: Jenne Pane, MD      Hospitalization Summary    CONCISE NARRATIVE:   [name] is a 44 yo M with PMH [place] and UC s/p total colectomy w/ J pouch creation in 2010 who was transferred from OSH for management of obstructive jaundice/worsening liver disease. was C/O worsening abd pain, jaundice, and darkened urine. The OSH course was notable for removal of previously placed CBD stent (12/04/19 as an outpatient).  C/b new onset DVT/PE and he was started on a heparin drip, ileus and ill-defined heterogenous attenuation of right hepatic lobe on CT scan.       GI bleed: continued bleeding with hydrocortisone suppositories and Cipro. Suppository was changed to foam and repeated pouchoscopy 1/29.  Found one small actively oozing ulcer in the pouch, treated with epinephrine and bicap cautery. 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 He has had a good response and it could be considered to increase to 40mg  daily if oozing continues.    DVT/PE- hx of DVT/PE from OSH, started on heparin gtt but was held d/t GIB. Occlusive clot LLE noted on [place] on 01/02/20, hematology consulted for risk vs benefit recommendations - currently not on any anticoagulation.     Concern for cholangitis: ERCP done on 12/04/19 with stent placement. Patient developed jaundiced afterwards, so the biliary stent was then removed. MR/MRCP completed on 12/22/2019 shows intrahepatic duct beading, without main duct narrowing. Reviewed the imaging with advanced endoscopist, and the utility of stent in addressing hyperbilirubinemia is low and it was decided not to repeat ERCP while inpatient.  Patient is currently listed for liver transplant but now is on internal hold given  concerns for infiltrative cholangiocarcinoma. Palliative Care consulted for goals of care and comfort management. During goals of care discussion with patient and family, it was decided patient would prefer to spend his time with his family and does not want to continue in the hospital alone for the remainder of his life. He would like to pursue hospice/comfort care. Hospice referral has been made but he does not have a PCP and needs a physician to manage his hospice medications. He will be transferred to Palliative Care Inpatient Unit while discharge services are arranged. He was stable for discharge home on 2/4 with hospice enrollment after arrival at home.                    CARDIAC TESTING:   12/22/19 Echocardiogram:  Normal LV mass and volumes but indeterminate diastolic function.   Hyperdynamic calculated resting LVEF (77%) without significant wall motion abnormalities.   No significant valvular abnormalities.    Grossly normal RV size and function but indeterminate pulmonary artery pressures.    ENDOSCOPY:   12/27/2019 EGD: Findings:     Esophagus:  ? Normal esophagus without varices, erosions or ulcers      Stomach:  ? No old or fresh blood upon entry  ? Mild PHG without active bleed  ? No ulcers or erosions  ? No gastric varices       Duodenum/small bowel:   No old or fresh blood with clear yellow  effluent into D3   No ulcers or erosions    Intervention(s):   None    Complication(s):   none    EBL: 0 ml     OTHER PROCEDURES/FINDINGS:   12/27/2019 Pouch exam: The gastroscope was inserted into the pouch, and there was old blood and blood clot. This was washed extensively and all clot was attempted to be removed with suctioning and a Roth net. There was mild friability of the mucosa with scattered small erosions. At the site of an actively oozing area, 3cc of 1:100:000 epinephrine was injected. The scope was then inserted further. At the pouch inlet, there was normal mucosa with mild friability but  no ulcers or erosions. The efferent limb into the J tip was examined with normal mucosa. The afferent limb was intubated to 60cm with yellow/dark brown effluent and normal mucosa.     Impression(s):    All old blood and clot were collected in the pouch, which was mostly cleared.    Friable mucosa and scattered erosions in the pouch. Epinephrine was injected at areas of active oozing. 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 No ulcers or erosions at the pouch inlet or neo-terminal ileum.     Recommendation(s):   Will start oral cipro 500mg  BID and see if with clinical improvement in the next few days. Also will start rectal hydrocortisone 25mg  once daily   Can restart clear liquid diet and advance as tolerated   Continue to monitor CBC BID and transfuse if hemoglobin < 7 or hematocrit < 21   Okay to stop pantoprazole    12/28/19 - IR percutaneous liver biopsy    CT RESULTS:   12/22/2019 CT abdomen and pelvis with IV contrast:   1. Ill-defined, heterogeneous attenuation involving the right hepatic lobe, with focal overlying capsular retraction. This is concerning for possible cholangiocarcinoma, with abscess and metastasis less likely.  2. Sequela of known biliary PSC. Unchanged mixed density lesion in the left hepatic lobe.  3. Soft tissue thickening in the upper retroperitoneum, likely confluent adenopathy.  4. Splenomegaly of unclear etiology though this may related to developing portal hypertension.  5. Multiple focally dilated small bowel loops, likely representing developing ileus.  6. Distended gallbladder.  7. Sequela of fluid overload, with small right pleural effusion, moderate intra-abdominal ascites, and moderate periportal edema.    12/28/19 - CT angio abdomen and pelvis  1. No acute GI bleed is visualized on CT exam.  2. No significant change in ill-defined heterogeneous liver lesions with overlying capsular retraction. This is concerning for cholangiocarcinoma with abscess and metastasis considered less  likely.  3. Sequela of known biliary PSC.  4. Soft tissue thickening within the upper retroperitoneum and throughout the mesentery, likely confluent adenopathy.  5. Unchanged mild splenomegaly.  6. Interval increase in gallbladder distention.  7. Slight interval decrease in the extent of the small bowel wall dilatation, which may be due to an evolving ileus, likely within a small bowel obstruction. This can be followed on further imaging, including plain films.2  8. Sequela of fluid overload including small bilateral pleural effusions, body wall anasarca, and intra-abdominal ascites.          MRI RESULTS:   12/22/2019 MRI abdomen without contrast and MRCP with 3 d render: Suboptimal evaluation due to patient's difficulty with complete the study due to pain. The patient may return for the remaining images when it is clinically appropriate.    1. Distended small bowel loops with small amount of ascites, incompletely evaluated  on this exam. This may represent small bowel obstruction or ileus.    Recommend further evaluation with CT abdomen and pelvis.    2. Sequela of known PSC, grossly similar to prior exams.    3. Redemonstration of the segment 2 cystic lesion with layering debris, grossly stable since prior exams.    Findings discussed with the [facility] by Dr. Kirke Shaggy of radiology as documented in the primordial system.        ULTRASOUND RESULTS:   01/02/20 - US doppler vein LLE  1. Complete occlusive clot involving peroneal vein and posterior tibial vein. The clot is in close proximity to popliteal vein.  2. Small right Baker's cyst.  3. Thrombosed superficial vein seen posterior to popliteal vessels.      XRAY RESULTS:   12/24/2019 KUB: Variably mildly distended segments of bowel in the central abdomen, some are likely small bowel segments and others could be colonic segments.   This pattern could be due to an evolving ileus, follow-up radiographs could be performed to further assess as needed.  Evidence of  ascites again seen.                CONSULTANT SERVICE     Physical Therapy     Nutrition     Hospice and Palliative Medicine             Signed: [name], NP  On: 01/04/2020  at: 11:02 AM

## 2020-01-04 NOTE — Progress Notes (Addendum)
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    Significant 24h Events: Ready for discharge today.    Subjective: Kyle Bright lying in bed. New concerns of need to defecate at night and not being able to along with pain in calf form DVT. Kyle Bright tearful and concerns and questions answered. Hospice home visit set up for 1 pm tomorrow. Feels his medications and dosing are appropriate for his pain.      Palliative Care ROS:  Continues with fatigue and concern for constipation and calf pain (neuropathic in description).       Patient Active Problem List   Diagnosis Code    PSC (primary sclerosing cholangitis) K83.01    Primary sclerosing cholangitis K83.01       Allergies   Allergen Reactions    Adhesive Tape Rash    Sulfa Antibiotics Rash     Rash on legs        Scheduled Meds:    senna  1 tablet Oral BID    polyethylene glycol  17 g Oral Daily    pantoprazole  40 mg Oral BID AC    nadolol  20 mg Oral Daily    escitalopram  10 mg Oral Daily    docusate sodium  100 mg Oral 2 times per day    hydrocortisone-pramoxine  1 applicator Rectal 2 times per day    simethicone  80 mg Oral 4x Daily    lidocaine  1 patch Transdermal Q24H    ciprofloxacin  500 mg Oral 2 times per day    acetaminophen  650 mg Oral BID       Continuous Infusions: n/a      PRN Meds:  hydrOXYzine HCl, HYDROmorphone **OR** HYDROmorphone, HYDROmorphone, LORazepam, sodium chloride, dextrose, bisacodyl, haloperidol lactate    Past 24h PRN Usage:  - Dilaudid 4 mg po x 5    Physical Examination:   Resp:  [12-16]   General appearance: Cachectic frail  jaundiced young male lying in bed.  Head: atraumatic but with temporal wasting and icteric sclera  Lungs: CTA, respirations easy RRR.   Heart: mild tachy  Abdomen: sl distended, mild tender  Extremities: jaundiced, warm, dry, left arm HL  Neurologic: A+ O x 3.       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     Pain/Dyspnea  - acetaminophen 650 mg po bid  - proctofoam-HC bid   - Lidoderm patch every day  - simethicone 80 mg po qid   - Dilaudid 2, 4, or 6 mg depending on pain level q 3 hrs prn  - add Neurontin 100 mg po qhs, can increase it to 200 mg if tolerated      Anxiety/Agitation/nausea/itching  - Haldol 0.5 mg  q 3 hrs prn nausea and first line aggitation  - ativan 0.5 mg po q 4 hrs prn anxiety  - atarax 10 mg po tid prn    Elimination/UC/LGIB/Constipation prevention, last BM 2/2  - colace 100 mg bid, senna increase to 1 tabs po bid, add Miralax 17 gm po qd  -  Per patient request stopped Mesalamine as not adding to symptom relief  - PPI bid    GOC/Prognosis: Now DNR/DNI/CMO. Hospice home visit tomorrow. [name] voicing concerns about quarantine rule for out of state family who want to visit and questions about altered time for discharge. Medical concerns addressed and social work meeting with them in regards to home transport questions.  Plan of care discussed with patient, attending, bedside nurse, social work, and hospice. Prognosis weeks or less if continues to GIB.      [name] ANP  Palliative Care Service  [facility] [phone]  01/04/2020 10:24 AM          Attending note: Patient seen and examined jointly by myself and  [name], NP.   I reviewed and edited her notes. Agree with the details in above notes.

## 2020-01-04 NOTE — Progress Notes (Signed)
Social Work Progress Note:     Contacts: Patient, IT sales professional and Family: [place]     Intervention:   Discussed d/c plan with the pt and his fianc, [name] after the provider had expressed that the pt and family my need transport to be changed to tomorrow.     Called [facility] and confirmed that they can provide transport at [place] and [name] confirmed that they in agreement with this plan.     SW provided family with voucher for six parking passes.  SW explained that this voucher needs to be redeemed at parking office on the ground floor of the garage.      Transportation:  BLS  for home Hospice plan  pick up at [employer]  - [facility] expressed that they plan to [employer] first.  Voucher provided as back up payer. (Spoke with Aliyah at [facility])      Plan:   Home d/c plan for tomorrow at [place]. Rx have been covered by voucher and will need to be sent with the pt or his fianc, [name]. [name] plans to follow the Ambulance in her own car.       Writer's contact with patient was via face-to-face PPE used: mask, gloves, and eye protection.   Was patient masked during visit: no. Family masked: Yes    Marya Fossa, [place]  Pager [phone]

## 2020-01-04 NOTE — Plan of Care (Signed)
Problem: Comfort  Goal: Without pain, discomfort, nausea, restlessness,  Description  Without pain, discomfort, nausea, restlessness, agitaion, hypo- or hyperthermia  Outcome: Progressing towards goal

## 2020-01-04 NOTE — Progress Notes (Addendum)
Report Given To  Starr Sinclair, RN       Descriptive Sentence / Reason for Admission   Hx: 12/04/19 ERCP with CBD stent placed   Presented to [facility] ED in [place] with increasing abdominal pain, jaundice and darkened urine found to have imaging consistent with obstructive jaundice / concerns for acute cholangitis. Work-up was revealing for PE / non-occlusive DVT for which he was started on a heparin gtt. Bili increase and was transferred to [facility]  PMH: Anemia, Ulcerative Colitis, Liver disease, Osteoporosis, primary sclerosing cholangitis  A&OX3  Up ad lib      Active Issues / Relevant Events   1900-0700  Complaints of back & abd pain. Received his PRN Dilaudid (4mg ) with good effect. When reassessed pt sleeping comfortably, did not awaken. Tolerating oral medications whole with water. Requested to hold off on rectal foam because of discomfort. Lido patch applied to mid lower back. S/o Jessica at the bedside providing support. Monitor     Called out for rectal foam. Administered at 0040. Pt requested cranberry juice.          Given Miralax early for complaints of constipation. Next shift to monitor results.       To Do List      Exception has been made for pt's wife to come visit 1/30 between the hours of 0800-2000      Anticipatory Guidance / Discharge Planning  TBD

## 2020-01-04 NOTE — Consults (Signed)
Medical Nutrition Therapy Brief Note:    Pt has elected goals of comfort care. Pt on low fiber/residue diet. If within goals of care, recommend advancement to regular diet. Please offer food/fluids as desired and tolerated for comfort at this time. Nutrition Services respectfully signs off at this time. Please re-consult if care plan changes or if further nutrition intervention is needed.     Loann Quill, MS, RDN, CDN  Clinical Dietitian  PIC [phone]

## 2020-01-04 NOTE — Progress Notes (Addendum)
Writer received call from Collie Siad from [facility] who has accepted referral and has scheduled first opening visit tomorrow, 01/05/20 at 1 pm. Writer will fax AVS to [facility] [phone] when completed. Melissa Christodaro SW updated, and she has scheduled ambulance transportation for today at 2:30pm.    Wallace Going, RN, BSN  Acute Care Coordinator Nurse Leader/Educator/Preceptor  [phone]; pager [phone]  Weekend covering Coordinator pager [phone]    Addendum 11:25am: per Marya Fossa SW transportation changed to 7am pick up time on 01/05/20. Writer updated Collie Siad from [employer], and eFaxed Discharge Summary.  Wallace Going, Therapist, sports, BSN

## 2020-01-04 NOTE — Progress Notes (Signed)
Report Given To  Caron Presume, RN      Descriptive Sentence / Reason for Admission   Hx: 12/04/19 ERCP with CBD stent placed   Presented to [facility] ED in [place] with increasing abdominal pain, jaundice and darkened urine found to have imaging consistent with obstructive jaundice / concerns for acute cholangitis. Work-up was revealing for PE / non-occlusive DVT for which he was started on a heparin gtt. Bili increase and was transferred to [facility]  PMH: Anemia, Ulcerative Colitis, Liver disease, Osteoporosis, primary sclerosing cholangitis  A&OX3  Up ad lib      Active Issues / Relevant Events   NPN 07:00 - 15:30  Kyle Bright had a comfortable day. He is independent with meals and his ADL's. He has a good appetite, but ot able to eat full meals. His Terrall Laity was with him most of the day and offered good support. He did request Dilaudid x 1 for body aches and was effective. Anticipate d/c tomorrow morning.      To Do List      Exception has been made for pt's wife to come visit 1/30 between the hours of 0800-2000      Anticipatory Guidance / Discharge Planning  TBD

## 2020-01-04 NOTE — Progress Notes (Signed)
Report Given To  Caron Presume, RN      Descriptive Sentence / Reason for Admission   Hx: 12/04/19 ERCP with CBD stent placed   Presented to [facility] ED in [place] with increasing abdominal pain, jaundice and darkened urine found to have imaging consistent with obstructive jaundice / concerns for acute cholangitis. Work-up was revealing for PE / non-occlusive DVT for which he was started on a heparin gtt. Bili increase and was transferred to [facility]  PMH: Anemia, Ulcerative Colitis, Liver disease, Osteoporosis, primary sclerosing cholangitis  A&OX3  Up ad lib      Active Issues / Relevant Events   NPN 1500-0730: Coletin remains A&Ox3. He has stated that when he moves around his pain spikes but is tolerable when he rests in the bed. No PRNs needed. Able to take his Pills whole without difficulty. Pt is expected to discharge at 0700 tomorrow morning. Pt's s/o Jess providing support at the bedside.         To Do List      Exception has been made for pt's wife to come visit 1/30 between the hours of 0800-2000      Anticipatory Guidance / Discharge Planning  TBD

## 2020-01-05 NOTE — Progress Notes (Signed)
Patient discharged to home with hospice care provided by [facility]. Writer faxed AVS (initially to phone number, but deleted and faxed to [phone]). Amy from Hospice notified of discharge time, and she confirmed first visit time with nursing at 1pm today.     Wallace Going, RN, BSN  Acute Care Coordinator Nurse Leader/Educator/Preceptor  [phone]; pager [phone]  Weekend covering Coordinator pager [phone]

## 2020-01-05 NOTE — Plan of Care (Signed)
Problem: Comfort  Goal: Without pain, discomfort, nausea, restlessness,  Description  Without pain, discomfort, nausea, restlessness, agitaion, hypo- or hyperthermia  Outcome: Progressing towards goal

## 2020-01-05 NOTE — Progress Notes (Signed)
Report Given To        Descriptive Sentence / Reason for Admission   Hx: 12/04/19 ERCP with CBD stent placed   Presented to [facility] ED in [place] with increasing abdominal pain, jaundice and darkened urine found to have imaging consistent with obstructive jaundice / concerns for acute cholangitis. Work-up was revealing for PE / non-occlusive DVT for which he was started on a heparin gtt. Bili increase and was transferred to [facility]  PMH: Anemia, Ulcerative Colitis, Liver disease, Osteoporosis, primary sclerosing cholangitis  A&OX3  Up ad lib      Active Issues / Relevant Events   1900-0700  Jaelin remains A&Ox3. Up ad lib independently. He has stated that when he moves around his pain spikes but is tolerable when he rests in the bed. PRN Dilaudid for abdominal pain with good effect. Able to take his pills whole without difficulty. Reports he had a bowel movement on 01/04/20. Pt is expected to discharge at 0700 Friday morning. Pt's s/o Jess providing support at the bedside. Monitor     Requesting PRN Atarax (anxiety) and PRN Dilaudid (6mg ) at 0645 before d/c. Instructions reviewed with [name].       To Do List      Exception has been made for pt's wife to come visit 1/30 between the hours of 0800-2000      Anticipatory Guidance / Discharge Planning  TBD

## 2020-01-05 NOTE — Progress Notes (Signed)
Kyle Bright was without complaint this morning awaiting his ride to [place] and home. He was able to take his AM meds prior to departing with exception of bowel meds that were held due to transportation. He was given 2 mg Dilaudid prior to the trip. Transportation was late and did not arrive till [name] was accompanied by his Terrall Laity and took all belongings, and medication that was delivered from pharmacy.

## 2020-01-10 ENCOUNTER — Other Ambulatory Visit: Payer: Self-pay

## 2020-01-16 ENCOUNTER — Telehealth: Payer: Self-pay

## 2020-01-16 ENCOUNTER — Other Ambulatory Visit: Payer: Self-pay

## 2020-01-16 NOTE — Telephone Encounter (Signed)
[name] relative (medical proxy) calling regarding needing refill on lidocaine (LIDODERM) 5 % patch . Patient is currently under hospice care and this is a prescription they don't deal with per [name]. Call back # [phone]

## 2020-01-16 NOTE — Telephone Encounter (Signed)
Returned call to patient's partner, [name], who wanted to know who can refill Lidocaine patch. Last ordered by palliative care.  She will reach out to that department and/or [facility] pharmacy to help with refill.

## 8387-08-01 DEATH — deceased
